# Patient Record
Sex: Female | Born: 1960 | Race: White | Hispanic: No | Marital: Married | Smoking: Current every day smoker
Health system: Southern US, Community
[De-identification: ages and names within clinical notes are randomized; demographics above are authoritative.]

## PROBLEM LIST (undated history)

## (undated) DIAGNOSIS — E28319 Asymptomatic premature menopause: Secondary | ICD-10-CM

## (undated) DIAGNOSIS — F17200 Nicotine dependence, unspecified, uncomplicated: Secondary | ICD-10-CM

## (undated) DIAGNOSIS — C539 Malignant neoplasm of cervix uteri, unspecified: Secondary | ICD-10-CM

## (undated) DIAGNOSIS — R079 Chest pain, unspecified: Secondary | ICD-10-CM

## (undated) DIAGNOSIS — J302 Other seasonal allergic rhinitis: Secondary | ICD-10-CM

## (undated) DIAGNOSIS — I219 Acute myocardial infarction, unspecified: Secondary | ICD-10-CM

## (undated) DIAGNOSIS — R569 Unspecified convulsions: Secondary | ICD-10-CM

## (undated) DIAGNOSIS — M797 Fibromyalgia: Secondary | ICD-10-CM

## (undated) DIAGNOSIS — R87629 Unspecified abnormal cytological findings in specimens from vagina: Secondary | ICD-10-CM

## (undated) DIAGNOSIS — K219 Gastro-esophageal reflux disease without esophagitis: Secondary | ICD-10-CM

## (undated) DIAGNOSIS — M8580 Other specified disorders of bone density and structure, unspecified site: Secondary | ICD-10-CM

## (undated) DIAGNOSIS — E785 Hyperlipidemia, unspecified: Secondary | ICD-10-CM

## (undated) HISTORY — DX: Chest pain, unspecified: R07.9

## (undated) HISTORY — DX: Other seasonal allergic rhinitis: J30.2

## (undated) HISTORY — DX: Gastro-esophageal reflux disease without esophagitis: K21.9

## (undated) HISTORY — DX: Nicotine dependence, unspecified, uncomplicated: F17.200

## (undated) HISTORY — DX: Unspecified abnormal cytological findings in specimens from vagina: R87.629

## (undated) HISTORY — DX: Unspecified convulsions: R56.9

## (undated) HISTORY — PX: VAGINAL HYSTERECTOMY: SUR661

## (undated) HISTORY — DX: Asymptomatic premature menopause: E28.319

## (undated) HISTORY — PX: WRIST SURGERY: SHX841

## (undated) HISTORY — PX: OTHER SURGICAL HISTORY: SHX169

## (undated) HISTORY — DX: Malignant neoplasm of cervix uteri, unspecified: C53.9

---

## 1998-04-09 ENCOUNTER — Other Ambulatory Visit: Admission: RE | Admit: 1998-04-09 | Discharge: 1998-04-09 | Payer: Self-pay | Admitting: Obstetrics & Gynecology

## 2008-09-20 ENCOUNTER — Encounter: Admission: RE | Admit: 2008-09-20 | Discharge: 2008-09-20 | Payer: Self-pay | Admitting: Family Medicine

## 2008-11-02 DIAGNOSIS — R569 Unspecified convulsions: Secondary | ICD-10-CM

## 2008-11-02 HISTORY — DX: Unspecified convulsions: R56.9

## 2009-02-04 ENCOUNTER — Emergency Department (HOSPITAL_COMMUNITY): Admission: EM | Admit: 2009-02-04 | Discharge: 2009-02-04 | Payer: Self-pay | Admitting: Emergency Medicine

## 2009-02-11 ENCOUNTER — Encounter: Admission: RE | Admit: 2009-02-11 | Discharge: 2009-02-11 | Payer: Self-pay | Admitting: Family Medicine

## 2009-05-23 ENCOUNTER — Encounter: Admission: RE | Admit: 2009-05-23 | Discharge: 2009-05-23 | Payer: Self-pay | Admitting: Family Medicine

## 2010-08-05 ENCOUNTER — Ambulatory Visit (HOSPITAL_BASED_OUTPATIENT_CLINIC_OR_DEPARTMENT_OTHER): Admission: RE | Admit: 2010-08-05 | Discharge: 2010-08-05 | Payer: Self-pay | Admitting: Orthopedic Surgery

## 2011-02-11 LAB — COMPREHENSIVE METABOLIC PANEL
AST: 37 U/L (ref 0–37)
Albumin: 4 g/dL (ref 3.5–5.2)
Alkaline Phosphatase: 72 U/L (ref 39–117)
CO2: 27 mEq/L (ref 19–32)
Calcium: 9 mg/dL (ref 8.4–10.5)
GFR calc non Af Amer: >60 mL/min (ref >60)
Sodium: 137 mEq/L (ref 135–145)
Total Bilirubin: 0.6 mg/dL (ref 0.3–1.2)
Total Protein: 7 g/dL (ref 6.0–8.3)

## 2011-02-11 LAB — DIFFERENTIAL
Basophils Absolute: 0 10*3/uL (ref 0.0–0.1)
Basophils Relative: 0 % (ref 0–1)
Eosinophils Absolute: 0.2 10*3/uL (ref 0.0–0.7)
Eosinophils Relative: 2 % (ref 0–5)
Lymphocytes Relative: 16 % (ref 12–46)

## 2011-02-11 LAB — APTT: aPTT: 28 seconds (ref 24–37)

## 2011-02-11 LAB — POCT CARDIAC MARKERS
CKMB, poc: <1 ng/mL — ABNORMAL LOW (ref 1.0–8.0)
Myoglobin, poc: 117 ng/mL (ref 12–200)
Troponin i, poc: <0.05 ng/mL (ref 0.00–0.09)
Troponin i, poc: <0.05 ng/mL (ref 0.00–0.09)

## 2011-02-11 LAB — CBC
Hemoglobin: 12.6 g/dL (ref 12.0–15.0)
MCHC: 35.2 g/dL (ref 30.0–36.0)
RBC: 3.71 MIL/uL — ABNORMAL LOW (ref 3.87–5.11)
WBC: 10.7 10*3/uL — ABNORMAL HIGH (ref 4.0–10.5)

## 2011-02-11 LAB — ETHANOL: Alcohol, Ethyl (B): 5 mg/dL (ref 0–10)

## 2011-10-20 ENCOUNTER — Ambulatory Visit
Admission: RE | Admit: 2011-10-20 | Discharge: 2011-10-20 | Disposition: A | Discharge status: Home / Self Care | Payer: BC Managed Care – PPO | Source: Ambulatory Visit | Attending: Emergency Medicine | Admitting: Emergency Medicine

## 2011-10-20 ENCOUNTER — Other Ambulatory Visit: Payer: Self-pay | Admitting: Emergency Medicine

## 2011-10-20 DIAGNOSIS — R05 Cough: Secondary | ICD-10-CM

## 2012-02-27 ENCOUNTER — Emergency Department (HOSPITAL_COMMUNITY)
Admission: EM | Admit: 2012-02-27 | Discharge: 2012-02-28 | Disposition: A | Discharge status: Home / Self Care | Payer: BC Managed Care – PPO | Attending: Emergency Medicine | Admitting: Emergency Medicine

## 2012-02-27 DIAGNOSIS — IMO0001 Reserved for inherently not codable concepts without codable children: Secondary | ICD-10-CM | POA: Insufficient documentation

## 2012-02-27 DIAGNOSIS — X500XXA Overexertion from strenuous movement or load, initial encounter: Secondary | ICD-10-CM | POA: Insufficient documentation

## 2012-02-27 DIAGNOSIS — F17200 Nicotine dependence, unspecified, uncomplicated: Secondary | ICD-10-CM | POA: Insufficient documentation

## 2012-02-27 DIAGNOSIS — S92309A Fracture of unspecified metatarsal bone(s), unspecified foot, initial encounter for closed fracture: Secondary | ICD-10-CM | POA: Insufficient documentation

## 2012-02-27 DIAGNOSIS — I252 Old myocardial infarction: Secondary | ICD-10-CM | POA: Insufficient documentation

## 2012-02-27 DIAGNOSIS — S92902A Unspecified fracture of left foot, initial encounter for closed fracture: Secondary | ICD-10-CM

## 2012-02-27 HISTORY — DX: Fibromyalgia: M79.7

## 2012-02-27 HISTORY — DX: Acute myocardial infarction, unspecified: I21.9

## 2012-02-27 HISTORY — DX: Other specified disorders of bone density and structure, unspecified site: M85.80

## 2012-02-27 HISTORY — DX: Hyperlipidemia, unspecified: E78.5

## 2012-02-28 ENCOUNTER — Encounter (HOSPITAL_COMMUNITY): Payer: Self-pay | Admitting: General Practice

## 2012-02-28 ENCOUNTER — Emergency Department (HOSPITAL_COMMUNITY): Payer: BC Managed Care – PPO

## 2012-02-28 MED ORDER — HYDROCODONE-ACETAMINOPHEN 5-500 MG PO TABS: 1.0000 | ORAL_TABLET | Freq: Four times a day (QID) | ORAL | Status: AC | PRN

## 2012-02-28 MED ORDER — IBUPROFEN 800 MG PO TABS: 800.0000 mg | ORAL_TABLET | Freq: Three times a day (TID) | ORAL | Status: AC

## 2012-02-28 MED ORDER — IBUPROFEN 800 MG PO TABS
800.0000 mg | ORAL_TABLET | Freq: Once | ORAL | Status: AC
  Administered 2012-02-28: 800 mg via ORAL
  Filled 2012-02-28: qty 1

## 2012-02-28 MED ORDER — HYDROCODONE-ACETAMINOPHEN 5-325 MG PO TABS
2.0000 | ORAL_TABLET | Freq: Once | ORAL | Status: AC
  Administered 2012-02-28: 2 via ORAL
  Filled 2012-02-28: qty 2

## 2012-02-28 NOTE — ED Notes (Signed)
Pt states that she was walking and stepped onto uneven concrete and rolled her foot. She states that it has increasingly swelled since the event. Pt is in no acute distress and denies pain anywhere else.

## 2012-02-28 NOTE — ED Notes (Signed)
Patient given discharge instructions, information, prescriptions, and diet order. Patient states that they adequately understand discharge information given and to return to ED if symptoms return or worsen.     

## 2012-02-28 NOTE — ED Provider Notes (Signed)
History     CSN: [id number]  Arrival date & time 02/27/12  2346   First MD Initiated Contact with Patient 02/28/12 0035      Chief Complaint  Patient presents with  . Foot Pain    (Consider location/radiation/quality/duration/timing/severity/associated sxs/prior treatment) HPI History provided by the patient. Walking in clogs tonight and rolled her left foot, sustaining injury. No fall or injury otherwise. Complaints of pain across the top of her foot. Sharp in quality and not radiating. No ankle pain. No knee pain. No hip pain. No associated weakness or numbness. Mild swelling. No abrasion or open skin wound. No history of same. Unable to bear weight. Hurts to try to walk, otherwise no known alleviating factors. Past Medical History  Diagnosis Date  . MI (myocardial infarction)   . Hyperlipidemia   . Fibromyalgia   . Osteopenia     Past Surgical History  Procedure Date  . Vaginal hysterectomy   . Wrist surgery     No family history on file.  History  Substance Use Topics  . Smoking status: Current Everyday Smoker -- 1.0 packs/day for 10 years    Types: Cigarettes  . Smokeless tobacco: Never Used  . Alcohol Use: 8.4 oz/week    14 Cans of beer per week    OB History    Grav Para Term Preterm Abortions TAB SAB Ect Mult Living                  Review of Systems  Constitutional: Negative for fever and chills.  HENT: Negative for neck pain and neck stiffness.   Eyes: Negative for pain.  Respiratory: Negative for shortness of breath.   Cardiovascular: Negative for chest pain.  Gastrointestinal: Negative for abdominal pain.  Genitourinary: Negative for dysuria.  Musculoskeletal: Negative for back pain.  Skin: Negative for rash.  Neurological: Negative for headaches.  All other systems reviewed and are negative.    Allergies  Darvocet  Home Medications   Current Outpatient Rx  Name Route Sig Dispense Refill  . ATORVASTATIN CALCIUM 20 MG PO TABS Oral Take  20 mg by mouth daily.    . TRAMADOL HCL 50 MG PO TABS Oral Take 50 mg by mouth every 8 (eight) hours as needed.      BP 143/79  Pulse 94  Temp(Src) 98.3 F (36.8 C) (Oral)  Resp 18  Ht 5\' 6"  (1.676 m)  Wt 115 lb (52.164 kg)  BMI 18.56 kg/m2  SpO2 98%  Physical Exam  Constitutional: She is oriented to person, place, and time. She appears well-developed and well-nourished.  HENT:  Head: Normocephalic and atraumatic.  Eyes: Conjunctivae and EOM are normal. Pupils are equal, round, and reactive to light.  Neck: Trachea normal. Neck supple. No thyromegaly present.  Cardiovascular: Normal rate, regular rhythm, S1 normal, S2 normal and normal pulses.     No systolic murmur is present   No diastolic murmur is present  Pulses:      Radial pulses are 2+ on the right side, and 2+ on the left side.  Pulmonary/Chest: Effort normal and breath sounds normal. She has no wheezes. She has no rhonchi. She has no rales. She exhibits no tenderness.  Abdominal: Normal appearance. There is no CVA tenderness and negative Murphy's sign.  Musculoskeletal:       Left lower extremity: Tenderness over the dorsum of the foot with minimal swelling. Distal neurovascular intact with good cap refill. No tenderness over the ankle or proximal fibula. Skin intact  throughout without abrasion or laceration  Neurological: She is alert and oriented to person, place, and time. She has normal strength. No cranial nerve deficit or sensory deficit. GCS eye subscore is 4. GCS verbal subscore is 5. GCS motor subscore is 6.  Skin: Skin is warm and dry. No rash noted. She is not diaphoretic.  Psychiatric: Her speech is normal.       Cooperative and appropriate    ED Course  Procedures (including critical care time)  Labs Reviewed - No data to display Dg Foot Complete Left  02/28/2012  *RADIOLOGY REPORT*  Clinical Data: Left foot pain status post fall  LEFT FOOT - COMPLETE 3+ VIEW  Comparison: The  Findings: Oblique mildly  comminuted fractures of the mid distal shafts of the second and third metatarsals.  In addition, there is a oblique fracture through the distal shaft of the fourth metatarsal.  No intra-articular extension. The medial sesamoid is favored to be bipartite however correlate with point tenderness.  IMPRESSION: Fractures of the second through fourth metatarsals.  Original Report Authenticated By: Waneta Martins, M.D.    Pain medications provided. Left lower extremity iced and elevated.  X-rays obtained and reviewed as above. Patient declines ankle films.  Splint applied by [employer] and on recheck has appropriate capillary refill and distal neurovascular intact. Crutches provided. MDM   Closed left foot fractures as above. Splint. Crutches. Pain medications provided. Orthopedic referral and plan outpatient followup. Patient understands no weightbearing and need for close followup.        Sunnie Nielsen, MD 02/28/12 [phone]

## 2012-02-28 NOTE — Discharge Instructions (Signed)
Foot Fracture Your caregiver has diagnosed you as having multiple foot fractures (broken bone) involving the left second third and fourth metatarsals.. Your foot has many bones. You have a fracture, or break, in one of these bones. In some cases, your doctor may put on a splint or removable fracture boot until the swelling in your foot has lessened. A cast may or may not be required. It is very important to use crutches and do not put any weight on the left foot until evaluated by an orthopedic surgeon. Call first thing in the morning for close followup in the clinic. Take medications as prescribed. No driving or medicated with hydrocodone. HOME CARE INSTRUCTIONS  If you do not have a cast or splint:  You may bear weight on your injured foot as tolerated or advised.   Do not put any weight on your injured foot for as long as directed by your caregiver. Slowly increase the amount of time you walk on the foot as the pain and swelling allows or as advised.   Use crutches until you can bear weight without pain. A gradual increase in weight bearing may help.   Apply ice to the injury for 15 to 20 minutes each hour while awake for the first 2 days. Put the ice in a plastic bag and place a towel between the bag of ice and your skin.   If an ace bandage (stretchy, elastic wrapping bandage) was applied, you may re-wrap it if ankle is more painful or your toes become cold and swollen.  If you have a cast or splint:  Use your crutches for as long as directed by your caregiver.   To lessen the swelling, keep the injured foot elevated on pillows while lying down or sitting. Elevate your foot above your heart.   Apply ice to the injury for 15 to 20 minutes each hour while awake for the first 2 days. Put the ice in a plastic bag and place a thin towel between the bag of ice and your cast.   Plaster or fiberglass cast:   Do not try to scratch the skin under the cast using a sharp or pointed object down the  cast.   Check the skin around the cast every day. You may put lotion on any red or sore areas.   Keep your cast clean and dry.   Plaster splint:   Wear the splint until you are seen for a follow-up examination.   You may loosen the elastic around the splint if your toes become numb, tingle, or turn blue or cold. Do not rest it on anything harder than a pillow in the first 24 hours.   Do not put pressure on any part of your splint. Use your crutches as directed.   Keep your splint dry. It can be protected during bathing with a plastic bag. Do not lower the splint into water.   If you have a fracture boot you may remove it to shower. Bear weight only as instructed by your caregiver.   Only take over-the-counter or prescription medicines for pain, discomfort, or fever as directed by your caregiver.  SEEK IMMEDIATE MEDICAL CARE IF:   Your cast gets damaged or breaks.   You have continued severe pain or more swelling than you did before the cast was put on.   Your skin or nails of your casted foot turn blue, gray, feel cold or numb.   There is a bad smell from your cast.  There is severe pain with movement of your toes.   There are new stains and/or drainage coming from under the cast.  MAKE SURE YOU:   Understand these instructions.   Will watch your condition.   Will get help right away if you are not doing well or get worse.

## 2012-09-05 ENCOUNTER — Other Ambulatory Visit: Payer: Self-pay | Admitting: Family Medicine

## 2012-09-05 ENCOUNTER — Ambulatory Visit
Admission: RE | Admit: 2012-09-05 | Discharge: 2012-09-05 | Disposition: A | Discharge status: Home / Self Care | Payer: BC Managed Care – PPO | Source: Ambulatory Visit | Attending: Family Medicine | Admitting: Family Medicine

## 2012-09-05 DIAGNOSIS — R52 Pain, unspecified: Secondary | ICD-10-CM

## 2012-09-12 ENCOUNTER — Encounter: Payer: Self-pay | Admitting: Family Medicine

## 2012-09-12 ENCOUNTER — Ambulatory Visit (INDEPENDENT_AMBULATORY_CARE_PROVIDER_SITE_OTHER): Payer: BC Managed Care – PPO | Admitting: Family Medicine

## 2012-09-12 VITALS — BP 112/78 | HR 72 | Ht 65.25 in | Wt 120.0 lb

## 2012-09-12 DIAGNOSIS — M8580 Other specified disorders of bone density and structure, unspecified site: Secondary | ICD-10-CM

## 2012-09-12 DIAGNOSIS — S92309A Fracture of unspecified metatarsal bone(s), unspecified foot, initial encounter for closed fracture: Secondary | ICD-10-CM

## 2012-09-12 DIAGNOSIS — K219 Gastro-esophageal reflux disease without esophagitis: Secondary | ICD-10-CM

## 2012-09-12 DIAGNOSIS — M899 Disorder of bone, unspecified: Secondary | ICD-10-CM

## 2012-09-12 DIAGNOSIS — E7800 Pure hypercholesterolemia, unspecified: Secondary | ICD-10-CM

## 2012-09-12 DIAGNOSIS — F17200 Nicotine dependence, unspecified, uncomplicated: Secondary | ICD-10-CM

## 2012-09-12 DIAGNOSIS — M949 Disorder of cartilage, unspecified: Secondary | ICD-10-CM

## 2012-09-12 DIAGNOSIS — I2510 Atherosclerotic heart disease of native coronary artery without angina pectoris: Secondary | ICD-10-CM

## 2012-09-12 NOTE — Patient Instructions (Signed)
Please send records (labs, office visits, DEXA, immunizations, etc)  Please try and quit smoking. I recommend cutting back on alcohol to just 1 drink/day (if that)  I'd like for you to consider alternatives for treatment of fibromyalgia pain (ie Savella, Cymbalta, Lyrica), and to taper down off Tramadol.

## 2012-09-12 NOTE — Progress Notes (Signed)
Chief Complaint  Patient presents with  . Establish Care    new patient to establish care. Would like to have DEXA updated and discuss some osteoporosis treatments.   Patient presents to establish care.  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 She is requesting DEXA scan.  Last DEXA was 05/2010 at [facility] showing osteopenia.  She used to work there at the time, and prefers to have it done somewhere closer. Vitamin D level 2 years ago was reportedly normal.  She fractured 2nd, 3rd and 4th metatarsals in her L foot (foot inverted walking across a pothole in clogs)  02/2012.  Treated by Dr. Victorino Dike.  In hard cast for 2.5 months, in CAM walker.  Got out of CAM walker in September.  Last week she accidentally kicked the wall.  Fractured 1st metatarsal.  Saw Dr. Victorino Dike and was put back in boot.  ?avulsion fracture on x-ray.    Her past medical history is also significant for reflux, tobacco abuse, fibromyalgia, hyperlipidemia and CAD. She states she hasn't seen Dr. Patty Sermons for at least 2 year. She later thought it may have been 5 years ago.  Last stress test was okay. Hyperlipidemia--last labs were 06/2011.  Had been getting treated by Dr. Lorenz Coaster, who left the office.  Fibromyalgia was diagnosed many years ago.  Hurts in all of her joints, shoulders, hamstrings, biceps.  Symptoms began in 1994.  Changed from narcotics to ultram many years ago.  Tried changing to celebrex for a year, and it didn't work as well.  Pain is mostly in her neck now.  With regard to her Ultram use "I've been on it forever,  It works".  She has never been on lyrica, neurontin, savella or cymbalta.  Was put on imipramine for trouble staying asleep, back in the 80's.  (also took desipramine at one point).  2 years ago she had recurrent trouble sleeping, so she restarted imipramine.  After she was back on it x 3 weeks (titrating up dose) she had grandmal seizure--felt to be  related to taking both imipramine and tramadol together, lowering the seizure threshold.  She had followed with neuro at the time.  Denies any recent seizure activity.  Past Medical History  Diagnosis Date  . MI (myocardial infarction) age 51    in [place].  Dr. Patty Sermons  . Hyperlipidemia   . Fibromyalgia   . Osteopenia   . Premature menopause [age]  . Cervical cancer early 20's  . Smoker   . Abnormal Pap smear of vagina     abnl paps of vaginal cuff; last pap 11/2003  . GERD (gastroesophageal reflux disease)   . Hyperlipidemia   . Seizure 2010    per pt, related to lowered seizure threshold from imipramine and tramadol)--saw Dr. Anne Hahn  . Seasonal allergies     now mostly yearlong   Past Surgical History  Procedure Date  . Vaginal hysterectomy     fibroids  . Wrist surgery     deQuervains  . Surgery for cervical cancer    History   Social History  . Marital Status: Single    Spouse Name: N/A    Number of Children: 1  . Years of Education: N/A   Occupational History  . 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 Smoking status: Current Every Day Smoker -- 1.0 packs/day for 10 years    Types: Cigarettes  . Smokeless  tobacco: Never Used  . Alcohol Use: 8.4 oz/week    14 Cans of beer per week     Comment: 2-3 beers per day.  . Drug Use: No  . Sexually Active: Not Currently   Other Topics Concern  . Not on file   Social History Narrative   Lives at home with 69 year old autistic son.  5 cats, 1 dog.     Family History  Problem Relation Age of Onset  . Scoliosis Mother   . Cancer Father 30    testicular cancer  . Autism Son   . Cancer Maternal Uncle     esophageal (smoker)  . Macular degeneration Maternal Grandmother     wet  . Hypertension Maternal Grandmother   . Hyperlipidemia Maternal Grandmother   . Depression Maternal Grandmother     related to age, loss of vision  . Colitis Maternal Grandmother   . Diabetes Neg Hx    Current outpatient  prescriptions:atorvastatin (LIPITOR) 10 MG tablet, Take 10 mg by mouth daily., Disp: , Rfl: ;  calcium carbonate (OS-CAL) 600 MG TABS, Take 1,200 mg by mouth daily., Disp: , Rfl: ;  Cholecalciferol (VITAMIN D) 1000 UNITS capsule, Take 1,000 Units by mouth daily., Disp: , Rfl: ;  lansoprazole (PREVACID) 30 MG capsule, Take 30 mg by mouth daily., Disp: , Rfl:  traMADol (ULTRAM) 50 MG tablet, Take 100 mg by mouth every 8 (eight) hours as needed. , Disp: , Rfl:   Allergies  Allergen Reactions  . Darvon (Propoxyphene Hcl) Nausea And Vomiting   ROS: denies fevers, weight changes, URI symptoms, cough, shortness of breath, chest pain, edema, skin lesions.  Reflux is controlled.  Denies dysphagia, bowel changes.  +diffuse joint/muscle pains.  denies bleeding/bruising, skin rashes/lesions.  Denies depression. Denies urinary complaints.  PHYSICAL EXAM: BP 112/78  Pulse 72  Ht 5' 5.25" (1.657 m)  Wt 120 lb (54.432 kg)  BMI 19.82 kg/m2 Thing, pleasant female, in a CAM walker, in no distress HEENT:  PERRL, EOMI, conjunctiva clear.  OP clear Neck: no lymphadenopathy, thyromegaly or mass Heart: regular rate and rhythm without murmur Lungs: clear bilaterally Abdomen: soft, nontender, no organomegaly or mass.  Normal bowel sounds Extremities: no edema.  Skin: no rash Psych:  Somewhat flat affect.  Nurse reported ?alcohol on breath (I couldn't detect).  Normal hygiene and grooming.  Normal gait. Neuro: alert and oriented.  Normal gait (in boot).  ASSESSMENT/PLAN:  1. Osteopenia  DG Bone Density  2. Metatarsal fracture  DG Bone Density  3. Tobacco use disorder    4. GERD (gastroesophageal reflux disease)    5. Pure hypercholesterolemia    6. CAD (coronary artery disease)     Smoking--counseled extensively regarding risks/consequences, especially with her recently fractured bones/osteopenia and h/o CAD. Excess alcohol intake discussed in detail, and strongly encouraged that she cut back to just 1/day  at most. GERD--controlled CAD--asymptomatic.  Recommend routine f/u with cardiology Hyperlipidemia--past due for labs.  Wants to go to [facility], opens at 7:30 c-met, lipids, TSH written on rx and given to pt for her to get labs through [employer] discussed at length my low comfort level with her current regimen--high doses of tramadol used chronically.  I do not feel comfortable maintaining her on this regimen.  We briefly discussed other medications used for treatment of fibromyalgia pain, and that she should return to discuss these in more detail (she can check into her insurance coverage for meds in the meantime, and look into  side effects).  She will need to taper down off ultram.  Will get flu shot at work before Thanksgiving.  Declined Flu shot at office today.  F/u 30 minute visit after DEXA results, after records reviewed. Patient to send records here--discussed which records needed.  Will discuss changing meds for fibromyalgia at her next visit.  45 minute visit, more than 1/2 spent counseling (alcohol, meds, smoking, etc.)

## 2012-09-14 DIAGNOSIS — M810 Age-related osteoporosis without current pathological fracture: Secondary | ICD-10-CM | POA: Insufficient documentation

## 2012-09-14 DIAGNOSIS — K219 Gastro-esophageal reflux disease without esophagitis: Secondary | ICD-10-CM | POA: Insufficient documentation

## 2012-09-14 DIAGNOSIS — M8580 Other specified disorders of bone density and structure, unspecified site: Secondary | ICD-10-CM | POA: Insufficient documentation

## 2012-09-14 DIAGNOSIS — I2510 Atherosclerotic heart disease of native coronary artery without angina pectoris: Secondary | ICD-10-CM | POA: Insufficient documentation

## 2012-09-14 DIAGNOSIS — E7800 Pure hypercholesterolemia, unspecified: Secondary | ICD-10-CM | POA: Insufficient documentation

## 2012-09-14 DIAGNOSIS — S92309A Fracture of unspecified metatarsal bone(s), unspecified foot, initial encounter for closed fracture: Secondary | ICD-10-CM | POA: Insufficient documentation

## 2012-09-14 DIAGNOSIS — F17200 Nicotine dependence, unspecified, uncomplicated: Secondary | ICD-10-CM | POA: Insufficient documentation

## 2012-10-19 ENCOUNTER — Ambulatory Visit: Payer: BC Managed Care – PPO | Admitting: Family Medicine

## 2015-10-31 ENCOUNTER — Emergency Department (HOSPITAL_COMMUNITY)
Admission: EM | Admit: 2015-10-31 | Discharge: 2015-10-31 | Disposition: A | Discharge status: Home / Self Care | Payer: Self-pay | Attending: Emergency Medicine | Admitting: Emergency Medicine

## 2015-10-31 ENCOUNTER — Encounter (HOSPITAL_COMMUNITY): Payer: Self-pay | Admitting: Emergency Medicine

## 2015-10-31 DIAGNOSIS — M797 Fibromyalgia: Secondary | ICD-10-CM | POA: Insufficient documentation

## 2015-10-31 DIAGNOSIS — M8580 Other specified disorders of bone density and structure, unspecified site: Secondary | ICD-10-CM | POA: Insufficient documentation

## 2015-10-31 DIAGNOSIS — I252 Old myocardial infarction: Secondary | ICD-10-CM | POA: Insufficient documentation

## 2015-10-31 DIAGNOSIS — M542 Cervicalgia: Secondary | ICD-10-CM | POA: Insufficient documentation

## 2015-10-31 DIAGNOSIS — E785 Hyperlipidemia, unspecified: Secondary | ICD-10-CM | POA: Insufficient documentation

## 2015-10-31 DIAGNOSIS — K219 Gastro-esophageal reflux disease without esophagitis: Secondary | ICD-10-CM | POA: Insufficient documentation

## 2015-10-31 DIAGNOSIS — G8929 Other chronic pain: Secondary | ICD-10-CM | POA: Insufficient documentation

## 2015-10-31 DIAGNOSIS — F17210 Nicotine dependence, cigarettes, uncomplicated: Secondary | ICD-10-CM | POA: Insufficient documentation

## 2015-10-31 DIAGNOSIS — Z8541 Personal history of malignant neoplasm of cervix uteri: Secondary | ICD-10-CM | POA: Insufficient documentation

## 2015-10-31 DIAGNOSIS — H9221 Otorrhagia, right ear: Secondary | ICD-10-CM | POA: Insufficient documentation

## 2015-10-31 DIAGNOSIS — R51 Headache: Secondary | ICD-10-CM | POA: Insufficient documentation

## 2015-10-31 DIAGNOSIS — Z79899 Other long term (current) drug therapy: Secondary | ICD-10-CM | POA: Insufficient documentation

## 2015-10-31 MED ORDER — METHOCARBAMOL 500 MG PO TABS: 1000.0000 mg | ORAL_TABLET | Freq: Every evening | ORAL | Status: DC | PRN

## 2015-10-31 NOTE — ED Provider Notes (Signed)
CSN: [id number]     Arrival date & time 10/31/15  1031 History  By signing my name below, I, Amber Roberts, attest that this documentation has been prepared under the direction and in the presence of [name], PA-C.  Electronically Signed: [name], ED Scribe. 10/31/2015. 11:55 AM.     Chief Complaint  Patient presents with  . Neck Pain  . Ear Drainage   Patient is a 54 y.o. female presenting with neck pain and ear drainage. The history is provided by the patient. No language interpreter was used.  Neck Pain Associated symptoms: headaches   Associated symptoms: no fever   Ear Drainage Associated symptoms include headaches.   HPI Comments: Amber Roberts is a 54 y.o. female who presents to the Emergency Department complaining of sudden onset of bloody right ear discharge onset today at 3 AM. Pt states that she woke up in the middle of the and noticed blood coming from her right ear. Pt does report some muffled hearing out of the right ear after irrigation today. She denies recent injury or trauma to the ear. Pt does report Hx of chronic neck pain. She state that its normal for the neck pain to radiates up causing her to have a posterior HA and left ear pain. She states that the pain is worse with movement. She states that she normally gets relief with ibuprofen. She denies hearing loss or vision changes. Pt does report hx of Bone spurs.    Past Medical History  Diagnosis Date  . MI (myocardial infarction) ([place]) age 47    in [place].  Dr. Mare Ferrari  . Hyperlipidemia   . Fibromyalgia   . Osteopenia   . Premature menopause [age]  . Cervical cancer ([place]) early 20's  . Smoker   . Abnormal Pap smear of vagina     abnl paps of vaginal cuff; last pap 11/2003  . GERD (gastroesophageal reflux disease)   . Hyperlipidemia   . Seizure ([place]) 2010    per pt, related to lowered seizure threshold from imipramine and tramadol)--saw Dr. Jannifer Franklin  . Seasonal allergies     now mostly  yearlong   Past Surgical History  Procedure Laterality Date  . Vaginal hysterectomy      fibroids  . Wrist surgery      deQuervains  . Surgery for cervical cancer     Family History  Problem Relation Age of Onset  . Scoliosis Mother   . Cancer Father 33    testicular cancer  . Autism Son   . Cancer Maternal Uncle     esophageal (smoker)  . Macular degeneration Maternal Grandmother     wet  . Hypertension Maternal Grandmother   . Hyperlipidemia Maternal Grandmother   . Depression Maternal Grandmother     related to age, loss of vision  . Colitis Maternal Grandmother   . Diabetes Neg Hx    Social History  Substance Use Topics  . Smoking status: Current Every Day Smoker -- 1.00 packs/day for 10 years    Types: Cigarettes  . Smokeless tobacco: Never Used  . Alcohol Use: 8.4 oz/week    14 Cans of beer per week     Comment: 2-3 beers per day.   OB History    No data available      Review of Systems  Constitutional: Negative for fever.  HENT: Positive for ear discharge and ear pain. Negative for hearing loss.   Eyes: Negative for visual disturbance.  Musculoskeletal: Positive for  neck pain.  Neurological: Positive for headaches.     Allergies  Darvon  Home Medications   Prior to Admission medications   Medication Sig Start Date End Date Taking? Authorizing Provider  atorvastatin (LIPITOR) 10 MG tablet Take 10 mg by mouth daily.    Historical Provider, MD  calcium carbonate (OS-CAL) 600 MG TABS Take 1,200 mg by mouth daily.    Historical Provider, MD  Cholecalciferol (VITAMIN D) 1000 UNITS capsule Take 1,000 Units by mouth daily.    Historical Provider, MD  lansoprazole (PREVACID) 30 MG capsule Take 30 mg by mouth daily.    Historical Provider, MD  traMADol (ULTRAM) 50 MG tablet Take 100 mg by mouth every 8 (eight) hours as needed.     Historical Provider, MD   BP 131/81 mmHg  Pulse 79  Temp(Src) 98.2 F (36.8 C) (Oral)  Resp 14  SpO2 100%   Physical Exam   Constitutional: She appears well-developed and well-nourished. No distress.  HENT:  Head: Normocephalic and atraumatic.  Right Ear: Tympanic membrane and external ear normal. Tympanic membrane is not injected, not scarred, not perforated, not erythematous, not retracted and not bulging.  Left Ear: Tympanic membrane, external ear and ear canal normal.  Nose: Nose normal. No mucosal edema or rhinorrhea.  Mouth/Throat: Uvula is midline, oropharynx is clear and moist and mucous membranes are normal. Mucous membranes are not dry. No oral lesions. No trismus in the jaw. No uvula swelling. No oropharyngeal exudate, posterior oropharyngeal edema, posterior oropharyngeal erythema or tonsillar abscesses.  Right TM is normal. There are several patches of dried blood noted in the ear canal itself. There is a small fissure-like area along the bottom of the canal with dried blood which appears to be the source.  Eyes: Conjunctivae and EOM are normal. Right eye exhibits no discharge. Left eye exhibits no discharge.  Neck: Normal range of motion. Neck supple. No tracheal deviation present.  Cardiovascular: Normal rate, regular rhythm and normal heart sounds.   Pulmonary/Chest: Effort normal and breath sounds normal. No respiratory distress. She has no wheezes. She has no rales.  Abdominal: Soft. There is no tenderness.  Musculoskeletal: Normal range of motion.       Cervical back: She exhibits tenderness. She exhibits normal range of motion and no bony tenderness.  Lymphadenopathy:    She has no cervical adenopathy.  Neurological: She is alert.  Skin: Skin is warm and dry.  Psychiatric: She has a normal mood and affect. Her behavior is normal.  Nursing note and vitals reviewed.   ED Course  Procedures (including critical care time) DIAGNOSTIC STUDIES: Oxygen Saturation is 100% on RA, normal by my interpretation.    COORDINATION OF CARE: 12:04 PM-Discussed treatment plan with pt at bedside and pt  agreed to plan.     Labs Review Labs Reviewed - No data to display  Imaging Review No results found.    EKG Interpretation None       Vital signs reviewed and are as follows: Filed Vitals:   10/31/15 1107  BP: 131/81  Pulse: 79  Temp: 98.2 F (36.8 C)  Resp: 14   Patient counseled to keep ear out of the canal for the next several days. Return with worsening or changing symptoms.  Regarding chronic neck pain, muscle relaxer given for bedtime. Patient encouraged to follow-up with PCP for continued evaluation and management.  MDM   Final diagnoses:  Bleeding from ear, right  Chronic neck pain   Bleeding from ear: Appears  to be from disruption of canal itself. TM appears normal. No signs of infection.  Chronic neck pain: Likely musculoskeletal in nature, however cannot rule out radicular pain. This has been ongoing for 6 months and is unchanged recently. Muscle relaxer given for bedtime. Patient encouraged to follow-up with PCP. No red flag signs and symptoms of neck or back pain.   I personally performed the services described in this documentation, which was scribed in my presence. The recorded information has been reviewed and is accurate.      [name], PA-C 10/31/15 [place], MD 11/02/15 [phone]

## 2015-10-31 NOTE — Discharge Instructions (Signed)
Please read and follow all provided instructions.  Your diagnoses today include:  1. Bleeding from ear, right   2. Chronic neck pain    Tests performed today include:  Vital signs - see below for your results today  Medications prescribed:   Robaxin (methocarbamol) - muscle relaxer medication  DO NOT drive or perform any activities that require you to be awake and alert because this medicine can make you drowsy.   Take any prescribed medications only as directed.  Home care instructions:   Follow any educational materials contained in this packet  Please rest, use ice or heat on your back for the next several days  Do not lift, push, pull anything more than 10 pounds for the next week  Follow-up instructions: Please follow-up with your primary care provider in the next 1 week for further evaluation of your symptoms.   Return instructions:  SEEK IMMEDIATE MEDICAL ATTENTION IF YOU HAVE:  New numbness, tingling, weakness, or problem with the use of your arms or legs  Severe back pain not relieved with medications  Loss control of your bowels or bladder  Increasing pain in any areas of the body (such as chest or abdominal pain)  Shortness of breath, dizziness, or fainting.   Worsening nausea (feeling sick to your stomach), vomiting, fever, or sweats  Any other emergent concerns regarding your health   Additional Information:  Your vital signs today were: BP 131/81 mmHg   Pulse 79   Temp(Src) 98.2 F (36.8 C) (Oral)   Resp 14   SpO2 100% If your blood pressure (BP) was elevated above 135/85 this visit, please have this repeated by your doctor within one month. --------------

## 2015-10-31 NOTE — ED Notes (Signed)
Pt c/o neck and head pain x several months, and painless ear bright red bleeding without trauma onset last night after she woke up with a feeling of fullness in her ear and noticed there was blood. Hx of cervical cancer, no chemo or radiation. No anticoagulants. Tympanic membrane intact, bones of inner ear visible, cone of light in appropriate position, pearly gray with bright red spots. Dried blood in ear canal.

## 2017-07-09 ENCOUNTER — Ambulatory Visit (INDEPENDENT_AMBULATORY_CARE_PROVIDER_SITE_OTHER): Payer: 59 | Admitting: Primary Care

## 2017-07-09 ENCOUNTER — Encounter: Payer: Self-pay | Admitting: Primary Care

## 2017-07-09 VITALS — BP 122/74 | HR 80 | Temp 98.2°F | Ht 65.25 in | Wt 111.4 lb

## 2017-07-09 DIAGNOSIS — R002 Palpitations: Secondary | ICD-10-CM

## 2017-07-09 DIAGNOSIS — I2510 Atherosclerotic heart disease of native coronary artery without angina pectoris: Secondary | ICD-10-CM

## 2017-07-09 DIAGNOSIS — Z1231 Encounter for screening mammogram for malignant neoplasm of breast: Secondary | ICD-10-CM | POA: Diagnosis not present

## 2017-07-09 DIAGNOSIS — K219 Gastro-esophageal reflux disease without esophagitis: Secondary | ICD-10-CM

## 2017-07-09 DIAGNOSIS — E7800 Pure hypercholesterolemia, unspecified: Secondary | ICD-10-CM

## 2017-07-09 DIAGNOSIS — M8580 Other specified disorders of bone density and structure, unspecified site: Secondary | ICD-10-CM | POA: Diagnosis not present

## 2017-07-09 DIAGNOSIS — R000 Tachycardia, unspecified: Secondary | ICD-10-CM

## 2017-07-09 DIAGNOSIS — Z1239 Encounter for other screening for malignant neoplasm of breast: Secondary | ICD-10-CM

## 2017-07-09 DIAGNOSIS — E785 Hyperlipidemia, unspecified: Secondary | ICD-10-CM

## 2017-07-09 DIAGNOSIS — F17200 Nicotine dependence, unspecified, uncomplicated: Secondary | ICD-10-CM | POA: Diagnosis not present

## 2017-07-09 LAB — COMPREHENSIVE METABOLIC PANEL
ALBUMIN: 4.5 g/dL (ref 3.5–5.2)
ALK PHOS: 73 U/L (ref 39–117)
ALT: 26 U/L (ref 0–35)
AST: 39 U/L — AB (ref 0–37)
BILIRUBIN TOTAL: 0.6 mg/dL (ref 0.2–1.2)
BUN: 6 mg/dL (ref 6–23)
CALCIUM: 9.8 mg/dL (ref 8.4–10.5)
CO2: 28 mEq/L (ref 19–32)
CREATININE: 0.78 mg/dL (ref 0.40–1.20)
Chloride: 100 mEq/L (ref 96–112)
GFR: 81.63 mL/min (ref >60.00)
Glucose, Bld: 97 mg/dL (ref 70–99)
Potassium: 3.8 mEq/L (ref 3.5–5.1)
SODIUM: 138 meq/L (ref 135–145)
TOTAL PROTEIN: 7.5 g/dL (ref 6.0–8.3)

## 2017-07-09 LAB — CBC
HCT: 43.6 % (ref 36.0–46.0)
Hemoglobin: 14.6 g/dL (ref 12.0–15.0)
MCHC: 33.4 g/dL (ref 30.0–36.0)
MCV: 103.3 fl — AB (ref 78.0–100.0)
Platelets: 182 10*3/uL (ref 150.0–400.0)
RBC: 4.23 Mil/uL (ref 3.87–5.11)
RDW: 14.9 % (ref 11.5–15.5)
WBC: 7.3 10*3/uL (ref 4.0–10.5)

## 2017-07-09 LAB — LDL CHOLESTEROL, DIRECT: LDL DIRECT: 108 mg/dL

## 2017-07-09 LAB — LIPID PANEL
CHOLESTEROL: 232 mg/dL — AB (ref 0–200)
HDL: 66.9 mg/dL (ref >39.00)
NonHDL: 164.82
Total CHOL/HDL Ratio: 3
Triglycerides: 302 mg/dL — ABNORMAL HIGH (ref 0.0–149.0)
VLDL: 60.4 mg/dL — ABNORMAL HIGH (ref 0.0–40.0)

## 2017-07-09 LAB — TSH: TSH: 0.78 u[IU]/mL (ref 0.35–4.50)

## 2017-07-09 LAB — HEMOGLOBIN A1C: HEMOGLOBIN A1C: 5.3 % (ref 4.6–6.5)

## 2017-07-09 NOTE — Patient Instructions (Signed)
Complete lab work prior to leaving today.   Please call me if you develop the fast heart rate and palpitations again.  Call the [facility] to schedule your mammogram and bone density test.  I will be in touch early next week with your lab results and our next step.  It was a pleasure to meet you today! Please don't hesitate to call me with any questions. Welcome to [facility]!

## 2017-07-09 NOTE — Assessment & Plan Note (Addendum)
Check lipids today.  ECG today: NSR with rate of 71. T-wave inversion to V1 and V2. No acute ST changes. No PAC/PVC. Consider cardiology eval once labs return.

## 2017-07-09 NOTE — Assessment & Plan Note (Signed)
Current smoker, discussed importance of tobacco abuse with co-morbidities. She plans on quitting.

## 2017-07-09 NOTE — Assessment & Plan Note (Signed)
Overall improved over the years, continue Zantac PRN. Discouraged use of PPI's given history of osteopenia. Also discussed importance of tobacco cessation.

## 2017-07-09 NOTE — Assessment & Plan Note (Signed)
Order for Dexa scan placed. Discouraged smoking. Discussed to start vitamin D with calcium.

## 2017-07-09 NOTE — Assessment & Plan Note (Signed)
No recent lipid panel on file.  Lipids pending today. Was able to tolerate low doses of atorvastatin in the past, consider Crestor if needed given history of myalgias.

## 2017-07-09 NOTE — Progress Notes (Signed)
Subjective:    Patient ID: Amber Roberts, female    DOB: 1961-07-09, 56 y.o.   MRN: [id number]  HPI  Amber Roberts is a 56 year old female who presents today to establish care and discuss the problems mentioned below. Will obtain old records.  1) CAD/Hyperlipdiemia/Tachycardia: Diagnosed several years ago. History of myocardial infarction at age 83, but she doesn't think she actually had a heart attack. She never underwent cardiac catheterization. Her last stress test was unremarkable in 2008, previously getting stress tests annually that were unremarkable. No cardiology follow up in years.  She denies chest pain, but does experience palpitations. Sunday morning she woke up with a pulse rate of 132 and a BP of 150/94. She felt weak, fatigued for several days later. Her pulse rate would remain high for 4-5 hours, then reduce, then increase. Her tachycardia continued until Tuesday this week (three days ago). She denies anxiety, new medications, supplements, unexplained weight loss.   Previously managed on atorvastatin 10 mg for hyperlipidemia. She experienced myalgias with higher doses of atorvastatin, has not taken this in three years.   2) GERD: Intermittent, overall better. Previously taking Prevacid BID. She is currently taking Zantac less than twice weekly on average.   3) Osteopenia: Diagnosed years ago. Last bone density scan was in 2010. She is not taking calcium with vitamin D. She is currently smoking and plans on quitting soon.   Review of Systems  Constitutional: Negative for fatigue.  Eyes: Negative for visual disturbance.  Respiratory: Negative for shortness of breath.   Cardiovascular: Positive for palpitations. Negative for chest pain.  Gastrointestinal: Negative for nausea.  Endocrine: Negative for cold intolerance.  Musculoskeletal: Negative for myalgias.  Skin: Negative for color change.  Neurological: Negative for dizziness and headaches.  Psychiatric/Behavioral:  The patient is not nervous/anxious.        Past Medical History:  Diagnosis Date  . Abnormal Pap smear of vagina    abnl paps of vaginal cuff; last pap 11/2003  . Cervical cancer ([place]) early 20's  . Fibromyalgia   . GERD (gastroesophageal reflux disease)   . Hyperlipidemia   . Hyperlipidemia   . MI (myocardial infarction) ([place]) age 15   in [place].  Dr. Mare Ferrari  . Osteopenia   . Premature menopause [age]  . Seasonal allergies    now mostly yearlong  . Seizure ([place]) 2010   per pt, related to lowered seizure threshold from imipramine and tramadol)--saw Dr. Jannifer Franklin  . Smoker      Social History   Social History  . Marital status: Single    Spouse name: N/A  . Number of children: 1  . Years of education: N/A   Occupational History  . 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   Social History Main Topics  . Smoking status: Current Every Day Smoker    Packs/day: 1.00    Years: 10.00    Types: Cigarettes  . Smokeless tobacco: Never Used  . Alcohol use 8.4 oz/week    14 Cans of beer per week     Comment: 2-3 beers per day.  . Drug use: No  . Sexual activity: Not Currently   Other Topics Concern  . Not on file   Social History Narrative   Lives at home with 35 year old autistic son.  5 cats, 1 dog.      Past Surgical History:  Procedure Laterality Date  . surgery for cervical cancer    . VAGINAL HYSTERECTOMY  fibroids  . WRIST SURGERY     deQuervains    Family History  Problem Relation Age of Onset  . Scoliosis Mother   . Cancer Father 75       testicular cancer  . Autism Son   . Cancer Maternal Uncle        esophageal (smoker)  . Macular degeneration Maternal Grandmother        wet  . Hypertension Maternal Grandmother   . Hyperlipidemia Maternal Grandmother   . Depression Maternal Grandmother        related to age, loss of vision  . Colitis Maternal Grandmother   . Diabetes Neg Hx     Allergies  Allergen Reactions  . Darvon [Propoxyphene  Hcl] Nausea And Vomiting    No current outpatient prescriptions on file prior to visit.   No current facility-administered medications on file prior to visit.     BP 122/74   Pulse 80   Temp 98.2 F (36.8 C) (Oral)   Ht 5' 5.25" (1.657 m)   Wt 111 lb 6.4 oz (50.5 kg)   SpO2 99%   BMI 18.40 kg/m    Objective:   Physical Exam  Constitutional: She is oriented to person, place, and time. She appears well-nourished.  Neck: Neck supple.  Cardiovascular: Normal rate and regular rhythm.   Pulmonary/Chest: Effort normal and breath sounds normal.  Musculoskeletal: Normal range of motion.  Neurological: She is alert and oriented to person, place, and time.  Skin: Skin is warm and dry.  Psychiatric: She has a normal mood and affect.          Assessment & Plan:  Tachycardia:  Intermittent for three days earlier this week. Exam today with normal rate. ECG: NSR with rate of 71. T-wave inversion to V1 and V2. No acute ST changes. No PAC/PVC. Check TSH, CBC, CMP to rule out metabolic cause. Consider cardiology evaluation once labs return.   Sheral Flow, NP

## 2017-07-12 ENCOUNTER — Other Ambulatory Visit: Payer: Self-pay | Admitting: Primary Care

## 2017-07-12 DIAGNOSIS — R000 Tachycardia, unspecified: Secondary | ICD-10-CM

## 2017-07-12 DIAGNOSIS — I2510 Atherosclerotic heart disease of native coronary artery without angina pectoris: Secondary | ICD-10-CM

## 2017-07-16 ENCOUNTER — Other Ambulatory Visit: Payer: Self-pay | Admitting: Primary Care

## 2017-07-16 DIAGNOSIS — Z1231 Encounter for screening mammogram for malignant neoplasm of breast: Secondary | ICD-10-CM

## 2017-07-30 ENCOUNTER — Encounter: Payer: Self-pay | Admitting: Cardiology

## 2017-08-06 ENCOUNTER — Ambulatory Visit
Admission: RE | Admit: 2017-08-06 | Discharge: 2017-08-06 | Disposition: A | Discharge status: Home / Self Care | Payer: 59 | Source: Ambulatory Visit | Attending: Primary Care | Admitting: Primary Care

## 2017-08-06 DIAGNOSIS — M8580 Other specified disorders of bone density and structure, unspecified site: Secondary | ICD-10-CM

## 2017-08-06 DIAGNOSIS — Z1231 Encounter for screening mammogram for malignant neoplasm of breast: Secondary | ICD-10-CM

## 2017-08-10 NOTE — Progress Notes (Signed)
Cardiology Office Note   Date:  08/12/2017   ID:  Amber Roberts, DOB April 12, 1961, MRN [id number]  PCP:  [name], NP  Cardiologist:   Minus Breeding, MD  Referring:  [name], NP  Chief Complaint  Patient presents with  . Coronary Artery Disease      History of Present Illness: Amber Roberts is a 56 y.o. female who presents for evaluation of reported CAD and tachycardia.  She was referred by [name], NP   She had previously seen Dr. Mare Ferrari.  There is a mention of CAD but she did not have a cath.  She said that [place] had an abnormal EKG in the hospital for four days thinking that she had had an MI but they never did a cath.  When she saw Dr. Mare Ferrari she apparently had a negative stress test although I don't have these records.  This was about 2009.  She not getting chest pain.     She has been getting palpitations. She reports that this happens often at night. She'll feel her heart racing and will go into the 130s. She has a blood pressure cuff the records this. It will stay that way for hours. She says she feels like she has increased adrenaline. She feels drained happens. She doesn't describe irregular beats. She doesn't describe an abrupt onset or offset. She's not able to cause this to happen. She doesn't exercise routinely but doesn't notice it necessarily with activity although she's had a heart rate goes up much faster with activity and she thinks it should. She's not describing chest pressure, neck or arm discomfort. She's had no new shortness of breath, PND or orthopnea. She's had no weight gain or edema.   Past Medical History:  Diagnosis Date  . Abnormal Pap smear of vagina    abnl paps of vaginal cuff; last pap 11/2003  . Cervical cancer ([place]) early 20's  . Chest pain   . Fibromyalgia   . GERD (gastroesophageal reflux disease)   . Hyperlipidemia   . Osteopenia   . Premature menopause 39-38   . Seasonal allergies    now mostly yearlong  . Seizure ([place]) 2010   per pt, related to lowered seizure threshold from imipramine and tramadol)--saw Dr. Jannifer Franklin  . Smoker     Past Surgical History:  Procedure Laterality Date  . surgery for cervical cancer    . VAGINAL HYSTERECTOMY     fibroids  . WRIST SURGERY     deQuervains     No current outpatient prescriptions on file.   No current facility-administered medications for this visit.     Allergies:   Darvon [propoxyphene hcl]    Social History:  The patient  reports that she has been smoking Cigarettes.  She has a 10.00 pack-year smoking history. She has never used smokeless tobacco. She reports that she drinks about 8.4 oz of alcohol per week . She reports that she does not use drugs.   Family History:  The patient's family history includes Autism in her son; Cancer in her maternal uncle; Cancer (age of onset: 21) in her father; Colitis in her maternal grandmother; Depression in her maternal grandmother; Hyperlipidemia in her maternal grandmother; Hypertension in her maternal grandmother; Macular degeneration in her maternal grandmother; Scoliosis in her mother.    ROS:  Please see the history of present illness.   Otherwise, review of systems are positive for none.  All other systems are reviewed and negative.    PHYSICAL EXAM: VS:  BP 136/80   Pulse 96   Ht 5\' 11"  (1.803 m)   Wt 111 lb (50.3 kg)   SpO2 97%   BMI 15.48 kg/m  , BMI Body mass index is 15.48 kg/m. GENERAL:  Well appearing HEENT:  Pupils equal round and reactive, fundi not visualized, oral mucosa unremarkable NECK:  No jugular venous distention, waveform within normal limits, carotid upstroke brisk and symmetric, no bruits, no thyromegaly LYMPHATICS:  No cervical, inguinal adenopathy LUNGS:  Clear to auscultation bilaterally BACK:  No CVA tenderness CHEST:  Unremarkable HEART:  PMI not displaced or sustained,S1 and S2 within normal limits, no S3, no  S4, no clicks, no rubs, no murmurs ABD:  Flat, positive bowel sounds normal in frequency in pitch, no bruits, no rebound, no guarding, no midline pulsatile mass, no hepatomegaly, no splenomegaly EXT:  2 plus pulses throughout, no edema, no cyanosis no clubbing SKIN:  No rashes no nodules NEURO:  Cranial nerves II through XII grossly intact, motor grossly intact throughout PSYCH:  Cognitively intact, oriented to person place and time    EKG:  EKG is not ordered today. The ekg ordered 07/09/17 demonstrates  sinus rhythm, rate 71, axis within normal limits, intervals within normal limits, no acute ST-T wave changes.    Recent Labs: 07/09/2017: ALT 26; BUN 6; Creatinine, Ser 0.78; Hemoglobin 14.6; Platelets 182.0; Potassium 3.8; Sodium 138; TSH 0.78    Lipid Panel    Component Value Date/Time   CHOL 232 (H) 07/09/2017 1041   TRIG 302.0 (H) 07/09/2017 1041   HDL 66.90 07/09/2017 1041   CHOLHDL 3 07/09/2017 1041   VLDL 60.4 (H) 07/09/2017 1041   LDLDIRECT 108.0 07/09/2017 1041      Wt Readings from Last 3 Encounters:  08/11/17 111 lb (50.3 kg)  07/09/17 111 lb 6.4 oz (50.5 kg)  09/12/12 120 lb (54.4 kg)      Other studies Reviewed: Additional studies/ records that were reviewed today include: Labs. Review of the above records demonstrates:  Please see elsewhere in the note.     ASSESSMENT AND PLAN:   CAD:  I don't think she has any obstructive coronary disease symptoms. However, she is going to have a  POET (Plain Old Exercise Treadmill) for reasons described below.   HTN:  The blood pressure is at target. No change in medications is indicated. We will continue with therapeutic lifestyle changes (TLC).  DYSLIPIDEMIA:  Her LDL recently was 108.  No change in therapy.   TACHYCARDIA:  I am going to bring her back for a POET (Plain Old Exercise Treadmill) to see if we can induce symptoms.  I will also ask her to buy and Alive Cor to see if we can captures these.  Further treatment  will be based on these results.  Of note labs, to include a TSH, were normal.    The following changes have been made:  no change  Labs/ tests ordered today include:   Orders Placed This Encounter  Procedures  . Exercise Tolerance Test     Disposition:   FU with me after the above studies.     Signed, Minus Breeding, MD  08/12/2017 8:50 PM    [facility]

## 2017-08-11 ENCOUNTER — Encounter (INDEPENDENT_AMBULATORY_CARE_PROVIDER_SITE_OTHER): Payer: Self-pay

## 2017-08-11 ENCOUNTER — Ambulatory Visit (INDEPENDENT_AMBULATORY_CARE_PROVIDER_SITE_OTHER): Payer: 59 | Admitting: Cardiology

## 2017-08-11 ENCOUNTER — Encounter: Payer: Self-pay | Admitting: Cardiology

## 2017-08-11 VITALS — BP 136/80 | HR 96 | Ht 71.0 in | Wt 111.0 lb

## 2017-08-11 DIAGNOSIS — I10 Essential (primary) hypertension: Secondary | ICD-10-CM | POA: Diagnosis not present

## 2017-08-11 DIAGNOSIS — R9431 Abnormal electrocardiogram [ECG] [EKG]: Secondary | ICD-10-CM

## 2017-08-11 DIAGNOSIS — E785 Hyperlipidemia, unspecified: Secondary | ICD-10-CM

## 2017-08-11 DIAGNOSIS — R000 Tachycardia, unspecified: Secondary | ICD-10-CM

## 2017-08-11 NOTE — Patient Instructions (Signed)
Medication Instructions:  Continue current medications  If you need a refill on your cardiac medications before your next appointment, please call your pharmacy.  Labwork: None Ordered   Testing/Procedures: Your physician has requested that you have an exercise tolerance test. For further information please visit [email or web address]. Please also follow instruction sheet, as given.  Follow-Up: Your physician wants you to follow-up in: After Test.    Special Instructions:  AliveCor  Thank you for choosing [facility] at [facility]!!    \

## 2017-08-12 ENCOUNTER — Encounter: Payer: Self-pay | Admitting: Cardiology

## 2017-08-12 DIAGNOSIS — R9431 Abnormal electrocardiogram [ECG] [EKG]: Secondary | ICD-10-CM

## 2017-08-12 HISTORY — DX: Abnormal electrocardiogram (ECG) (EKG): R94.31

## 2017-08-13 ENCOUNTER — Ambulatory Visit (INDEPENDENT_AMBULATORY_CARE_PROVIDER_SITE_OTHER): Payer: 59 | Admitting: Primary Care

## 2017-08-13 ENCOUNTER — Encounter: Payer: Self-pay | Admitting: Primary Care

## 2017-08-13 VITALS — BP 122/70 | HR 84 | Temp 98.0°F | Ht 65.25 in | Wt 113.4 lb

## 2017-08-13 DIAGNOSIS — Z0000 Encounter for general adult medical examination without abnormal findings: Secondary | ICD-10-CM | POA: Diagnosis not present

## 2017-08-13 DIAGNOSIS — Z1211 Encounter for screening for malignant neoplasm of colon: Secondary | ICD-10-CM | POA: Diagnosis not present

## 2017-08-13 DIAGNOSIS — I8393 Asymptomatic varicose veins of bilateral lower extremities: Secondary | ICD-10-CM

## 2017-08-13 DIAGNOSIS — M1990 Unspecified osteoarthritis, unspecified site: Secondary | ICD-10-CM | POA: Insufficient documentation

## 2017-08-13 DIAGNOSIS — M159 Polyosteoarthritis, unspecified: Secondary | ICD-10-CM

## 2017-08-13 DIAGNOSIS — I8390 Asymptomatic varicose veins of unspecified lower extremity: Secondary | ICD-10-CM | POA: Insufficient documentation

## 2017-08-13 DIAGNOSIS — E7800 Pure hypercholesterolemia, unspecified: Secondary | ICD-10-CM

## 2017-08-13 DIAGNOSIS — M8589 Other specified disorders of bone density and structure, multiple sites: Secondary | ICD-10-CM | POA: Diagnosis not present

## 2017-08-13 DIAGNOSIS — I2510 Atherosclerotic heart disease of native coronary artery without angina pectoris: Secondary | ICD-10-CM

## 2017-08-13 DIAGNOSIS — Z0001 Encounter for general adult medical examination with abnormal findings: Secondary | ICD-10-CM | POA: Insufficient documentation

## 2017-08-13 DIAGNOSIS — E785 Hyperlipidemia, unspecified: Secondary | ICD-10-CM

## 2017-08-13 NOTE — Assessment & Plan Note (Addendum)
Stable on recent bone density scan. Discussed calcium and vitamin D, she plans on starting.

## 2017-08-13 NOTE — Assessment & Plan Note (Signed)
Chronic to bilateral lower extremities for years. Wearing compression hose. Stands for most of the day. Discussed to elevate extremities when at rest, consider higher level of compression hose. She will be following with vein and vascular soon.

## 2017-08-13 NOTE — Patient Instructions (Addendum)
Start Fish Oil 1000 mg capsules for high triglycerides. Take 1 capsule twice daily with food.  Start Calcium with Vitamin D as discussed.  You will be contacted regarding your referral to GI for the colonoscopy.  Please let us know if you have not heard back within one week.   Schedule a lab only apppointment in 6 months to recheck your cholesterol.   Follow up in 1 year for your annual exam.  It was a pleasure to see you today!  Food Choices to Lower Your Triglycerides Triglycerides are a type of fat in your blood. High levels of triglycerides can increase the risk of heart disease and stroke. If your triglyceride levels are high, the foods you eat and your eating habits are very important. Choosing the right foods can help lower your triglycerides. What general guidelines do I need to follow?  Lose weight if you are overweight.  Limit or avoid alcohol.  Fill one half of your plate with vegetables and green salads.  Limit fruit to two servings a day. Choose fruit instead of juice.  Make one fourth of your plate whole grains. Look for the word "whole" as the first word in the ingredient list.  Fill one fourth of your plate with lean protein foods.  Enjoy fatty fish (such as salmon, mackerel, sardines, and tuna) three times a week.  Choose healthy fats.  Limit foods high in starch and sugar.  Eat more home-cooked food and less restaurant, buffet, and fast food.  Limit fried foods.  Cook foods using methods other than frying.  Limit saturated fats.  Check ingredient lists to avoid foods with partially hydrogenated oils (trans fats) in them. What foods can I eat? Grains Whole grains, such as whole wheat or whole grain breads, crackers, cereals, and pasta. Unsweetened oatmeal, bulgur, barley, quinoa, or brown rice. Corn or whole wheat flour tortillas. Vegetables Fresh or frozen vegetables (raw, steamed, roasted, or grilled). Green salads. Fruits All fresh, canned (in  natural juice), or frozen fruits. Meat and Other Protein Products Ground beef (85% or leaner), grass-fed beef, or beef trimmed of fat. Skinless chicken or [place]. Ground chicken or [place]. Pork trimmed of fat. All fish and seafood. Eggs. Dried beans, peas, or lentils. Unsalted nuts or seeds. Unsalted canned or dry beans. Dairy Low-fat dairy products, such as skim or 1% milk, 2% or reduced-fat cheeses, low-fat ricotta or cottage cheese, or plain low-fat yogurt. Fats and Oils Tub margarines without trans fats. Light or reduced-fat mayonnaise and salad dressings. Avocado. Safflower, olive, or canola oils. Natural peanut or almond butter. The items listed above may not be a complete list of recommended foods or beverages. Contact your dietitian for more options. What foods are not recommended? Grains White bread. White pasta. White rice. Cornbread. Bagels, pastries, and croissants. Crackers that contain trans fat. Vegetables White potatoes. Corn. Creamed or fried vegetables. Vegetables in a cheese sauce. Fruits Dried fruits. Canned fruit in light or heavy syrup. Fruit juice. Meat and Other Protein Products Fatty cuts of meat. Ribs, chicken wings, bacon, sausage, bologna, salami, chitterlings, fatback, hot dogs, bratwurst, and packaged luncheon meats. Dairy Whole or 2% milk, cream, half-and-half, and cream cheese. Whole-fat or sweetened yogurt. Full-fat cheeses. Nondairy creamers and whipped toppings. Processed cheese, cheese spreads, or cheese curds. Sweets and Desserts Corn syrup, sugars, honey, and molasses. Candy. Jam and jelly. Syrup. Sweetened cereals. Cookies, pies, cakes, donuts, muffins, and ice cream. Fats and Oils Butter, stick margarine, lard, shortening, ghee, or bacon fat. Coconut,  palm kernel, or palm oils. Beverages Alcohol. Sweetened drinks (such as sodas, lemonade, and fruit drinks or punches). The items listed above may not be a complete list of foods and beverages to avoid.  Contact your dietitian for more information. This information is not intended to replace advice given to you by your health care provider. Make sure you discuss any questions you have with your health care provider. Document Released: 08/06/2004 Document Revised: 03/26/2016 Document Reviewed: 08/23/2013 Elsevier Interactive Patient Education  2017 [employer].

## 2017-08-13 NOTE — Assessment & Plan Note (Signed)
Located to numerous joints. Discussed low impact exercise such as swimming, elliptical.  Aleve PRN. Continue to monitor.

## 2017-08-13 NOTE — Assessment & Plan Note (Signed)
Immunizations up-to-date. Mammogram and bone density testing up-to-date. Colonoscopy due, pending. Recommended to increase vegetables, fruits, whole gr, lean protein. Reduce fatty/fried foods. Exam unremarkable. Labs overall stable. Follow-up in one year.

## 2017-08-13 NOTE — Progress Notes (Signed)
Subjective:    Patient ID: Amber Roberts, female    DOB: November 23, 1960, 56 y.o.   MRN: [id number]  HPI  Amber Roberts is a 56 year old female who presents today for complete physical.  Immunizations: -Tetanus: Completed in 2013 -Influenza: Completed this season    Diet: Amber Roberts endorses a fair diet. Breakfast: Skips Lunch: Take out food (meat and gravy, vegetables, potatoes) Dinner: Meat, vegetable, starch Snacks: None Desserts: None  Beverages: Unsweet tea, water, soda  Exercise: Amber Roberts does not currently exercise.  Eye exam: Completed in 2018 Dental exam: Has not completed in several years.  Colonoscopy: Never completed, due.  Dexa: Osteopenia Pap Smear: Hysterectomy  Mammogram: Completed in 2018, negative.   Review of Systems  Constitutional: Negative for unexpected weight change.  HENT: Negative for rhinorrhea.   Respiratory: Negative for cough and shortness of breath.   Cardiovascular: Negative for chest pain.       Varicose veins and swelling to bilateral lower exremities.  Gastrointestinal: Negative for constipation and diarrhea.  Genitourinary: Negative for difficulty urinating and menstrual problem.  Musculoskeletal: Positive for arthralgias. Negative for myalgias.       Chronic arthralgias, increased pain to hands, neck, elbows, hips  Skin: Negative for rash.  Allergic/Immunologic: Negative for environmental allergies.  Neurological: Negative for dizziness, numbness and headaches.  Psychiatric/Behavioral:       Denies concerns for anxiety and depression       Past Medical History:  Diagnosis Date  . Abnormal Pap smear of vagina    abnl paps of vaginal cuff; last pap 11/2003  . Cervical cancer ([place]) early 20's  . Chest pain   . Fibromyalgia   . GERD (gastroesophageal reflux disease)   . Hyperlipidemia   . Osteopenia   . Premature menopause 37-38  . Seasonal allergies    now mostly yearlong  . Seizure ([place]) 2010   per pt, related to lowered seizure  threshold from imipramine and tramadol)--saw Dr. Jannifer Franklin  . Smoker      Social History   Social History  . Marital status: Single    Spouse name: N/A  . Number of children: 1  . Years of education: N/A   Occupational History  . [employer]   Social History Main Topics  . Smoking status: Current Every Day Smoker    Packs/day: 1.00    Years: 10.00    Types: Cigarettes  . Smokeless tobacco: Never Used  . Alcohol use 8.4 oz/week    14 Cans of beer per week     Comment: 2-3 beers per day.  . Drug use: No  . Sexual activity: Not Currently   Other Topics Concern  . Not on file   Social History Narrative   Lives at home with 30 year old autistic son.  5 cats, 1 dog.      Past Surgical History:  Procedure Laterality Date  . surgery for cervical cancer    . VAGINAL HYSTERECTOMY     fibroids  . WRIST SURGERY     deQuervains    Family History  Problem Relation Age of Onset  . Scoliosis Mother   . Cancer Father 22       testicular cancer  . Autism Son   . Cancer Maternal Uncle        esophageal (smoker)  . Macular degeneration Maternal Grandmother        wet  . Hypertension Maternal Grandmother   . Hyperlipidemia Maternal Grandmother   . Depression Maternal  Grandmother        related to age, loss of vision  . Colitis Maternal Grandmother   . Diabetes Neg Hx   . Breast cancer Neg Hx     Allergies  Allergen Reactions  . Darvon [Propoxyphene Hcl] Nausea And Vomiting    No current outpatient prescriptions on file prior to visit.   No current facility-administered medications on file prior to visit.     BP 122/70   Pulse 84   Temp 98 F (36.7 C) (Oral)   Ht 5' 5.25" (1.657 m)   Wt 113 lb 6.4 oz (51.4 kg)   SpO2 98%   BMI 18.73 kg/m    Objective:   Physical Exam  Constitutional: Amber Roberts is oriented to person, place, and time. Amber Roberts appears well-nourished.  HENT:  Right Ear: Tympanic membrane and ear canal normal.  Left Ear: Tympanic membrane  and ear canal normal.  Nose: Nose normal.  Mouth/Throat: Oropharynx is clear and moist.  Eyes: Pupils are equal, round, and reactive to light. Conjunctivae and EOM are normal.  Neck: Neck supple. No thyromegaly present.  Cardiovascular: Normal rate and regular rhythm.   No murmur heard. Varicose veins to bilateral lower extremities.  No lower extremity edema.  Pulmonary/Chest: Effort normal and breath sounds normal. Amber Roberts has no rales.  Abdominal: Soft. Bowel sounds are normal. There is no tenderness.  Musculoskeletal: Normal range of motion.  Lymphadenopathy:    Amber Roberts has no cervical adenopathy.  Neurological: Amber Roberts is alert and oriented to person, place, and time. Amber Roberts has normal reflexes. No cranial nerve deficit.  Skin: Skin is warm and dry. No rash noted.  Psychiatric: Amber Roberts has a normal mood and affect.          Assessment & Plan:

## 2017-08-13 NOTE — Assessment & Plan Note (Signed)
TC slightly above goal (HDL at 66, LDL at 108). Trigs above goal.  Start Fish Oil 1000 mg BID with meals. Discussed to work on diet by limiting fatty/processed foods. Information provided regarding lowering triglycerides through diet. Recheck lipids in 6 months.

## 2017-08-13 NOTE — Assessment & Plan Note (Signed)
Recently evaluated by cardiology, will undergo stress test soon. Discussed lipid control through improvement in diet.

## 2017-08-18 ENCOUNTER — Telehealth (HOSPITAL_COMMUNITY): Payer: Self-pay

## 2017-08-18 NOTE — Telephone Encounter (Signed)
Encounter complete. 

## 2017-08-20 ENCOUNTER — Ambulatory Visit (HOSPITAL_COMMUNITY)
Admission: RE | Admit: 2017-08-20 | Discharge: 2017-08-20 | Disposition: A | Discharge status: Home / Self Care | Payer: 59 | Source: Ambulatory Visit | Attending: Cardiovascular Disease | Admitting: Cardiovascular Disease

## 2017-08-20 DIAGNOSIS — R000 Tachycardia, unspecified: Secondary | ICD-10-CM | POA: Diagnosis not present

## 2017-08-20 LAB — EXERCISE TOLERANCE TEST
CHL CUP MPHR: 166 {beats}/min
CHL CUP RESTING HR STRESS: 97 {beats}/min
CSEPPHR: 155 {beats}/min
Estimated workload: 8.5 METS
Exercise duration (min): 7 min
Exercise duration (sec): 0 s
Percent HR: 93 %
RPE: 18

## 2017-08-26 ENCOUNTER — Telehealth: Payer: Self-pay | Admitting: Cardiology

## 2017-08-26 NOTE — Telephone Encounter (Signed)
Amber Roberts is calling to get her GXT results , she is asking that you leave the results on her voicemail because she is not always able to answer the phone . Thanks

## 2017-08-26 NOTE — Telephone Encounter (Signed)
Returned the call to the patient. She verbalized her understanding.  Notes recorded by Minus Breeding, MD on 08/24/2017 at 1:27 PM EDT Negative adequate POET (Plain Old Exercise Treadmill). Call [name] with the results and send results to [name], NP

## 2017-08-27 ENCOUNTER — Encounter: Payer: Self-pay | Admitting: *Deleted

## 2017-09-17 ENCOUNTER — Encounter: Payer: Self-pay | Admitting: Primary Care

## 2017-09-28 NOTE — Progress Notes (Signed)
Cardiology Office Note   Date:  09/29/2017   ID:  Amber Roberts, DOB [birth year], MRN [id number]  PCP:  [name], NP  Cardiologist:   Minus Breeding, MD  Referring:  [name], NP  Chief Complaint  Patient presents with  . Palpitations      History of Present Illness: Amber Roberts is a 56 y.o. female who presents for evaluation of reported CAD and tachycardia.  She was referred by [name], NP   She had previously seen Dr. Mare Ferrari.  There is a mention of CAD but she did not have a cath.  She said [place] had an abnormal EKG in the hospital for four days thinking that she had had an MI but they never did a cath.  When she saw Dr. Mare Ferrari she apparently had a negative stress test although I don't have these records.  This was about 2009.  When I saw her at my first visit with her last month I ordered a POET (Plain Old Exercise Treadmill) which was negative.  She was having some palpitations as well.    Since the stress test she has done well.  She is having fewer palpitations.  The patient denies any new symptoms such as chest discomfort, neck or arm discomfort. There has been no new shortness of breath, PND or orthopnea. There have been no reported palpitations, presyncope or syncope.   Past Medical History:  Diagnosis Date  . Abnormal Pap smear of vagina    abnl paps of vaginal cuff; last pap 11/2003  . Cervical cancer ([place]) early 20's  . Chest pain   . Fibromyalgia   . GERD (gastroesophageal reflux disease)   . Hyperlipidemia   . Osteopenia   . Premature menopause 37-38  . Seasonal allergies    now mostly yearlong  . Seizure ([place]) 2010   per pt, related to lowered seizure threshold from imipramine and tramadol)--saw Dr. Jannifer Franklin  . Smoker     Past Surgical History:  Procedure Laterality Date  . surgery for cervical cancer    . VAGINAL HYSTERECTOMY     fibroids  . WRIST SURGERY      deQuervains     Current Outpatient Medications  Medication Sig Dispense Refill  . lansoprazole (PREVACID) 15 MG capsule Take 15 mg by mouth daily at 12 noon.     No current facility-administered medications for this visit.     Allergies:   Darvon [propoxyphene hcl]     ROS:  Please see the history of present illness.   Otherwise, review of systems are positive for none.   All other systems are reviewed and negative.    PHYSICAL EXAM: VS:  BP 133/84 (BP Location: Left Arm)   Pulse 80   Ht 5\' 5"  (1.651 m)   Wt 111 lb 6.4 oz (50.5 kg)   SpO2 97%   BMI 18.54 kg/m  , BMI Body mass index is 18.54 kg/m.  GENERAL:  Well appearing NECK:  No jugular venous distention, waveform within normal limits, carotid upstroke brisk and symmetric, no bruits, no thyromegaly LUNGS:  Clear to auscultation bilaterally CHEST:  Unremarkable HEART:  PMI not displaced or sustained,S1 and S2 within normal limits, no S3, no S4, no clicks, no rubs, no murmurs ABD:  Flat, positive bowel sounds normal in frequency in pitch, no bruits, no rebound, no guarding, no midline pulsatile mass, no hepatomegaly, no splenomegaly EXT:  2  plus pulses throughout, no edema, no cyanosis no clubbing    EKG:  EKG is not  ordered today.    Recent Labs: 07/09/2017: ALT 26; BUN 6; Creatinine, Ser 0.78; Hemoglobin 14.6; Platelets 182.0; Potassium 3.8; Sodium 138; TSH 0.78    Lipid Panel    Component Value Date/Time   CHOL 232 (H) 07/09/2017 1041   TRIG 302.0 (H) 07/09/2017 1041   HDL 66.90 07/09/2017 1041   CHOLHDL 3 07/09/2017 1041   VLDL 60.4 (H) 07/09/2017 1041   LDLDIRECT 108.0 07/09/2017 1041      Wt Readings from Last 3 Encounters:  09/29/17 111 lb 6.4 oz (50.5 kg)  08/13/17 113 lb 6.4 oz (51.4 kg)  08/11/17 111 lb (50.3 kg)      Other studies Reviewed: Additional studies/ records that were reviewed today include:  POET (Plain Old Exercise Treadmill).  . Review of the above records demonstrates:       ASSESSMENT AND PLAN:   ABNORMAL EKG:  She had a negative POET (Plain Old Exercise Treadmill).  She does have an abnormal EKG with anterior Q waves but she says this has been chronic for years.  She has no symptoms and no further testing is indicated.   HTN:  The blood pressure is at target but creeping up.  She will watch this over time.   DYSLIPIDEMIA:  Her triglycerides were elevated.  I will defer to [name], NP  TACHYCARDIA:   I asked her to by an Alive Cor.  She still plans to get this.  However, because the symptoms are improved she does not want therapy at this time.  She will bring reports if she has increased palpitations.   TOBACCO:  She understands the need to not smoke but she has significant stress in her life.    The following changes have been made:  None  Labs/ tests ordered today include:  None  No orders of the defined types were placed in this encounter.    Disposition:   FU with me as needed.     Signed, Minus Breeding, MD  09/29/2017 11:21 AM    [place]

## 2017-09-29 ENCOUNTER — Ambulatory Visit: Payer: 59 | Admitting: Cardiology

## 2017-09-29 ENCOUNTER — Encounter: Payer: Self-pay | Admitting: Cardiology

## 2017-09-29 ENCOUNTER — Encounter (INDEPENDENT_AMBULATORY_CARE_PROVIDER_SITE_OTHER): Payer: Self-pay

## 2017-09-29 VITALS — BP 133/84 | HR 80 | Ht 65.0 in | Wt 111.4 lb

## 2017-09-29 DIAGNOSIS — R002 Palpitations: Secondary | ICD-10-CM | POA: Insufficient documentation

## 2017-09-29 DIAGNOSIS — R9431 Abnormal electrocardiogram [ECG] [EKG]: Secondary | ICD-10-CM | POA: Diagnosis not present

## 2017-09-29 NOTE — Patient Instructions (Signed)
Medication Instructions:  Continue current medications  If you need a refill on your cardiac medications before your next appointment, please call your pharmacy.  Labwork: None Ordered  Testing/Procedures: None Ordered  Follow-Up: Your physician wants you to follow-up in: As Needed.      Thank you for choosing [facility]!!       

## 2018-02-11 ENCOUNTER — Other Ambulatory Visit (INDEPENDENT_AMBULATORY_CARE_PROVIDER_SITE_OTHER): Payer: 59

## 2018-02-11 DIAGNOSIS — E785 Hyperlipidemia, unspecified: Secondary | ICD-10-CM

## 2018-02-11 LAB — LIPID PANEL
Cholesterol: 184 mg/dL (ref 0–200)
HDL: 71.2 mg/dL (ref >39.00)
NONHDL: 112.73
Total CHOL/HDL Ratio: 3
Triglycerides: 279 mg/dL — ABNORMAL HIGH (ref 0.0–149.0)
VLDL: 55.8 mg/dL — ABNORMAL HIGH (ref 0.0–40.0)

## 2018-02-11 LAB — LDL CHOLESTEROL, DIRECT: LDL DIRECT: 77 mg/dL

## 2018-04-01 ENCOUNTER — Ambulatory Visit (INDEPENDENT_AMBULATORY_CARE_PROVIDER_SITE_OTHER)
Admission: RE | Admit: 2018-04-01 | Discharge: 2018-04-01 | Disposition: A | Discharge status: Home / Self Care | Payer: 59 | Source: Ambulatory Visit | Attending: Primary Care | Admitting: Primary Care

## 2018-04-01 ENCOUNTER — Encounter: Payer: Self-pay | Admitting: Primary Care

## 2018-04-01 ENCOUNTER — Ambulatory Visit: Payer: 59 | Admitting: Primary Care

## 2018-04-01 VITALS — BP 124/80 | HR 82 | Temp 98.0°F | Ht 65.0 in | Wt 107.2 lb

## 2018-04-01 DIAGNOSIS — R233 Spontaneous ecchymoses: Secondary | ICD-10-CM

## 2018-04-01 DIAGNOSIS — M79605 Pain in left leg: Secondary | ICD-10-CM

## 2018-04-01 DIAGNOSIS — G8929 Other chronic pain: Secondary | ICD-10-CM | POA: Diagnosis not present

## 2018-04-01 DIAGNOSIS — M542 Cervicalgia: Secondary | ICD-10-CM | POA: Diagnosis not present

## 2018-04-01 DIAGNOSIS — M79604 Pain in right leg: Secondary | ICD-10-CM

## 2018-04-01 HISTORY — DX: Pain in right leg: M79.604

## 2018-04-01 LAB — COMPREHENSIVE METABOLIC PANEL
ALBUMIN: 4.5 g/dL (ref 3.5–5.2)
ALT: 21 U/L (ref 0–35)
AST: 29 U/L (ref 0–37)
Alkaline Phosphatase: 79 U/L (ref 39–117)
BUN: 5 mg/dL — AB (ref 6–23)
CHLORIDE: 101 meq/L (ref 96–112)
CO2: 28 meq/L (ref 19–32)
Calcium: 9.8 mg/dL (ref 8.4–10.5)
Creatinine, Ser: 0.7 mg/dL (ref 0.40–1.20)
GFR: 92.24 mL/min (ref >60.00)
Glucose, Bld: 109 mg/dL — ABNORMAL HIGH (ref 70–99)
Potassium: 4.2 mEq/L (ref 3.5–5.1)
SODIUM: 136 meq/L (ref 135–145)
Total Bilirubin: 0.6 mg/dL (ref 0.2–1.2)
Total Protein: 7.7 g/dL (ref 6.0–8.3)

## 2018-04-01 LAB — CBC
HEMATOCRIT: 43.4 % (ref 36.0–46.0)
Hemoglobin: 14.9 g/dL (ref 12.0–15.0)
MCHC: 34.4 g/dL (ref 30.0–36.0)
MCV: 100.7 fl — AB (ref 78.0–100.0)
Platelets: 176 10*3/uL (ref 150.0–400.0)
RBC: 4.31 Mil/uL (ref 3.87–5.11)
RDW: 13.8 % (ref 11.5–15.5)
WBC: 5.6 10*3/uL (ref 4.0–10.5)

## 2018-04-01 NOTE — Assessment & Plan Note (Signed)
Present for years, history of bone spurs in the past. Repeat plain films of cervical spine. Consider PT vs orthopedic referral based off of xray results.

## 2018-04-01 NOTE — Progress Notes (Signed)
Subjective:    Patient ID: Amber Roberts, female    DOB: [birth year], 57 y.o.   MRN: [id number]  HPI  Amber Roberts is a 57 year old female with a history of varicose veins who presents today with multiple complaints.  1) Skin Rash: Amber Roberts reports developing petechiae in January 2019 after wearing thin tighter socks. Over the last month Amber Roberts's noticed gradual improvement in petechiae. Amber Roberts's mostly concerned about potential circulatory issues as Amber Roberts's noticing right lower extremity pain from her calf with radiation to her upper lower extremity and sometimes through her hip. Amber Roberts was walking her friend's dog last week and noticed that her veins were engorged to her lower extremities, this has happened before. It was hot outside.  Her right lower extremity has been "aching" and painful with both rest and ambulation, some improvement with rest. Amber Roberts does have some left lower extremity discomfort but not as worse as the right. Amber Roberts stands a lot during her occupation. Amber Roberts's smoked cigarettes off and on since age 8. Amber Roberts quit smoking for 7 years, started back in 2003. Amber Roberts is smoking slightly less than 1 PPD.   Amber Roberts does have chronic right hip and bilateral lower back pain, but her lower extremity pain feels different.   2) Neck Pain: History of osteoarthritis. Chronic neck pain over the last several years. Amber Roberts was told around age 43 that Amber Roberts had bone spurs on her cervical spine. Over the past 2 weeks Amber Roberts's noticed pain to her mid cervical spine with radiation down her left shoulder and up to her left head. Amber Roberts does notice numbness/tingling when Amber Roberts has her head flexed for any period of time.   Amber Roberts denies recent trauma/injury, weakness.   Review of Systems  Constitutional: Negative for fever.  Cardiovascular: Negative for chest pain.  Musculoskeletal: Positive for arthralgias.  Skin:       Petechiae to lower extremities.  Neurological: Positive for numbness.       Past Medical History:  Diagnosis  Date  . Abnormal Pap smear of vagina    abnl paps of vaginal cuff; last pap 11/2003  . Cervical cancer ([place]) early 20's  . Chest pain   . Fibromyalgia   . GERD (gastroesophageal reflux disease)   . Hyperlipidemia   . Osteopenia   . Premature menopause 37-38  . Seasonal allergies    now mostly yearlong  . Seizure ([place]) 2010   per pt, related to lowered seizure threshold from imipramine and tramadol)--saw Dr. Jannifer Franklin  . Smoker      Social History   Socioeconomic History  . Marital status: Single    Spouse name: Not on file  . Number of children: 1  . Years of education: Not on file  . Highest education level: Not on file  Occupational History  . 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 Financial resource strain: Not on file  . Food insecurity:    Worry: Not on file    Inability: Not on file  . Transportation needs:    Medical: Not on file    Non-medical: Not on file  Tobacco Use  . Smoking status: Current Every Day Smoker    Packs/day: 1.00    Years: 10.00    Pack years: 10.00    Types: Cigarettes  . Smokeless tobacco: Never Used  Substance and Sexual Activity  . Alcohol use: Yes    Alcohol/week: 8.4 oz    Types: 14 Cans of beer per week  Comment: 2-3 beers per day.  . Drug use: No  . Sexual activity: Not Currently  Lifestyle  . Physical activity:    Days per week: Not on file    Minutes per session: Not on file  . Stress: Not on file  Relationships  . Social connections:    Talks on phone: Not on file    Gets together: Not on file    Attends religious service: Not on file    Active member of club or organization: Not on file    Attends meetings of clubs or organizations: Not on file    Relationship status: Not on file  . Intimate partner violence:    Fear of current or ex partner: Not on file    Emotionally abused: Not on file    Physically abused: Not on file    Forced sexual activity: Not on file  Other Topics Concern  .  Not on file  Social History Narrative   Lives at home with 49 year old autistic son.  5 cats, 1 dog.      Past Surgical History:  Procedure Laterality Date  . surgery for cervical cancer    . VAGINAL HYSTERECTOMY     fibroids  . WRIST SURGERY     deQuervains    Family History  Problem Relation Age of Onset  . Scoliosis Mother   . Cancer Father 74       testicular cancer  . Autism Son   . Cancer Maternal Uncle        esophageal (smoker)  . Macular degeneration Maternal Grandmother        wet  . Hypertension Maternal Grandmother   . Hyperlipidemia Maternal Grandmother   . Depression Maternal Grandmother        related to age, loss of vision  . Colitis Maternal Grandmother   . Diabetes Neg Hx   . Breast cancer Neg Hx     Allergies  Allergen Reactions  . Darvon [Propoxyphene Hcl] Nausea And Vomiting    No current outpatient medications on file prior to visit.   No current facility-administered medications on file prior to visit.     BP 124/80   Pulse 82   Temp 98 F (36.7 C) (Oral)   Ht 5\' 5"  (1.651 m)   Wt 107 lb 4 oz (48.6 kg)   SpO2 98%   BMI 17.85 kg/m    Objective:   Physical Exam  Constitutional: Amber Roberts appears well-nourished.  Cardiovascular: Normal rate and regular rhythm.  Pulses:      Dorsalis pedis pulses are 2+ on the right side, and 2+ on the left side.       Posterior tibial pulses are 2+ on the right side, and 2+ on the left side.  Musculoskeletal:       Cervical back: Amber Roberts exhibits pain. Amber Roberts exhibits normal range of motion and no tenderness.       Back:  Skin: Skin is warm and dry.  No obvious petechiae noted on exam, does have numerous freckles to lower extremities.            Assessment & Plan:  Petechiae:  Present since January, gradually improved over last month. No obvious petechiae on exam. Will check CBC and CMP to rule out any metabolic cause.  [name], NP

## 2018-04-01 NOTE — Assessment & Plan Note (Signed)
Present for months, increased over last 2 weeks.  Exam today with 2+ pedal pulses bilaterally. Symptoms could be secondary to vascular or orthopedic cause.   Given history of tobacco abuse and CAD will check ABI's to rule out vascular cause. If ABI's negative then consider lumbar spine vs hip involvement.

## 2018-04-01 NOTE — Patient Instructions (Signed)
Complete xray(s) and labs prior to leaving today. I will notify you of your results once received.  You will be contacted regarding your doppler study.  Please let us know if you have not been contacted within one week.   It was a pleasure to see you today!

## 2018-04-04 ENCOUNTER — Encounter: Payer: Self-pay | Admitting: *Deleted

## 2018-04-08 ENCOUNTER — Ambulatory Visit (HOSPITAL_COMMUNITY)
Admission: RE | Admit: 2018-04-08 | Discharge: 2018-04-08 | Disposition: A | Discharge status: Home / Self Care | Payer: 59 | Source: Ambulatory Visit | Attending: Cardiology | Admitting: Cardiology

## 2018-04-08 DIAGNOSIS — M79605 Pain in left leg: Secondary | ICD-10-CM | POA: Insufficient documentation

## 2018-04-08 DIAGNOSIS — F17200 Nicotine dependence, unspecified, uncomplicated: Secondary | ICD-10-CM | POA: Insufficient documentation

## 2018-04-08 DIAGNOSIS — M79604 Pain in right leg: Secondary | ICD-10-CM | POA: Diagnosis not present

## 2018-04-08 DIAGNOSIS — I2510 Atherosclerotic heart disease of native coronary artery without angina pectoris: Secondary | ICD-10-CM | POA: Insufficient documentation

## 2018-04-08 DIAGNOSIS — E785 Hyperlipidemia, unspecified: Secondary | ICD-10-CM | POA: Insufficient documentation

## 2018-04-25 ENCOUNTER — Encounter: Payer: Self-pay | Admitting: Primary Care

## 2018-04-25 DIAGNOSIS — M542 Cervicalgia: Principal | ICD-10-CM

## 2018-04-25 DIAGNOSIS — G8929 Other chronic pain: Secondary | ICD-10-CM

## 2018-04-26 MED ORDER — PREDNISONE 20 MG PO TABS: ORAL_TABLET | ORAL | 0 refills | Status: DC

## 2018-05-09 ENCOUNTER — Encounter: Payer: Self-pay | Admitting: Primary Care

## 2018-05-12 ENCOUNTER — Encounter: Payer: Self-pay | Admitting: Primary Care

## 2018-05-12 DIAGNOSIS — M542 Cervicalgia: Principal | ICD-10-CM

## 2018-05-12 DIAGNOSIS — G8929 Other chronic pain: Secondary | ICD-10-CM

## 2018-05-12 MED ORDER — TRAMADOL HCL 50 MG PO TABS: 50.0000 mg | ORAL_TABLET | Freq: Three times a day (TID) | ORAL | 0 refills | Status: DC | PRN

## 2018-06-06 ENCOUNTER — Other Ambulatory Visit: Payer: Self-pay | Admitting: Neurosurgery

## 2018-06-06 DIAGNOSIS — M4722 Other spondylosis with radiculopathy, cervical region: Secondary | ICD-10-CM

## 2018-06-10 ENCOUNTER — Ambulatory Visit
Admission: RE | Admit: 2018-06-10 | Discharge: 2018-06-10 | Disposition: A | Discharge status: Home / Self Care | Payer: 59 | Source: Ambulatory Visit | Attending: Neurosurgery | Admitting: Neurosurgery

## 2018-06-10 DIAGNOSIS — M4722 Other spondylosis with radiculopathy, cervical region: Secondary | ICD-10-CM

## 2018-06-16 DIAGNOSIS — Z87898 Personal history of other specified conditions: Secondary | ICD-10-CM

## 2018-06-16 MED ORDER — SCOPOLAMINE 1 MG/3DAYS TD PT72: 1.0000 | MEDICATED_PATCH | TRANSDERMAL | 0 refills | Status: DC

## 2018-07-25 ENCOUNTER — Other Ambulatory Visit: Payer: Self-pay | Admitting: Primary Care

## 2018-07-25 DIAGNOSIS — Z1231 Encounter for screening mammogram for malignant neoplasm of breast: Secondary | ICD-10-CM

## 2018-08-12 ENCOUNTER — Ambulatory Visit
Admission: RE | Admit: 2018-08-12 | Discharge: 2018-08-12 | Disposition: A | Discharge status: Home / Self Care | Payer: 59 | Source: Ambulatory Visit | Attending: Primary Care | Admitting: Primary Care

## 2018-08-12 DIAGNOSIS — Z1231 Encounter for screening mammogram for malignant neoplasm of breast: Secondary | ICD-10-CM | POA: Insufficient documentation

## 2018-08-19 ENCOUNTER — Encounter: Payer: 59 | Admitting: Primary Care

## 2018-08-26 ENCOUNTER — Ambulatory Visit (INDEPENDENT_AMBULATORY_CARE_PROVIDER_SITE_OTHER): Payer: 59 | Admitting: Primary Care

## 2018-08-26 ENCOUNTER — Encounter: Payer: Self-pay | Admitting: Primary Care

## 2018-08-26 VITALS — BP 128/80 | HR 84 | Temp 98.2°F | Ht 65.0 in | Wt 111.5 lb

## 2018-08-26 DIAGNOSIS — F17200 Nicotine dependence, unspecified, uncomplicated: Secondary | ICD-10-CM

## 2018-08-26 DIAGNOSIS — K219 Gastro-esophageal reflux disease without esophagitis: Secondary | ICD-10-CM

## 2018-08-26 DIAGNOSIS — G8929 Other chronic pain: Secondary | ICD-10-CM

## 2018-08-26 DIAGNOSIS — M542 Cervicalgia: Secondary | ICD-10-CM | POA: Diagnosis not present

## 2018-08-26 DIAGNOSIS — I2510 Atherosclerotic heart disease of native coronary artery without angina pectoris: Secondary | ICD-10-CM

## 2018-08-26 DIAGNOSIS — R002 Palpitations: Secondary | ICD-10-CM

## 2018-08-26 DIAGNOSIS — Z1159 Encounter for screening for other viral diseases: Secondary | ICD-10-CM

## 2018-08-26 DIAGNOSIS — Z23 Encounter for immunization: Secondary | ICD-10-CM

## 2018-08-26 DIAGNOSIS — E7800 Pure hypercholesterolemia, unspecified: Secondary | ICD-10-CM

## 2018-08-26 DIAGNOSIS — M8589 Other specified disorders of bone density and structure, multiple sites: Secondary | ICD-10-CM

## 2018-08-26 DIAGNOSIS — Z0000 Encounter for general adult medical examination without abnormal findings: Secondary | ICD-10-CM

## 2018-08-26 LAB — COMPREHENSIVE METABOLIC PANEL
ALK PHOS: 72 U/L (ref 39–117)
ALT: 19 U/L (ref 0–35)
AST: 24 U/L (ref 0–37)
Albumin: 4.5 g/dL (ref 3.5–5.2)
BILIRUBIN TOTAL: 0.7 mg/dL (ref 0.2–1.2)
BUN: 4 mg/dL — AB (ref 6–23)
CO2: 26 meq/L (ref 19–32)
CREATININE: 0.66 mg/dL (ref 0.40–1.20)
Calcium: 9.3 mg/dL (ref 8.4–10.5)
Chloride: 100 mEq/L (ref 96–112)
GFR: 98.57 mL/min (ref >60.00)
GLUCOSE: 84 mg/dL (ref 70–99)
Potassium: 3.9 mEq/L (ref 3.5–5.1)
SODIUM: 135 meq/L (ref 135–145)
TOTAL PROTEIN: 7.3 g/dL (ref 6.0–8.3)

## 2018-08-26 LAB — LIPID PANEL
Cholesterol: 220 mg/dL — ABNORMAL HIGH (ref 0–200)
HDL: 81.9 mg/dL (ref >39.00)
NONHDL: 138.01
Total CHOL/HDL Ratio: 3
Triglycerides: 209 mg/dL — ABNORMAL HIGH (ref 0.0–149.0)
VLDL: 41.8 mg/dL — ABNORMAL HIGH (ref 0.0–40.0)

## 2018-08-26 LAB — LDL CHOLESTEROL, DIRECT: Direct LDL: 114 mg/dL

## 2018-08-26 MED ORDER — ZOSTER VAC RECOMB ADJUVANTED 50 MCG/0.5ML IM SUSR: 0.5000 mL | Freq: Once | INTRAMUSCULAR | 1 refills | Status: AC

## 2018-08-26 MED ORDER — TRAMADOL HCL 50 MG PO TABS: 50.0000 mg | ORAL_TABLET | Freq: Two times a day (BID) | ORAL | 0 refills | Status: DC | PRN

## 2018-08-26 MED ORDER — PREDNISONE 20 MG PO TABS: ORAL_TABLET | ORAL | 0 refills | Status: DC

## 2018-08-26 NOTE — Assessment & Plan Note (Signed)
Asymptomatic. Following with cardiology.

## 2018-08-26 NOTE — Assessment & Plan Note (Signed)
Not taking calcium/vitamin D. Discussed to start 1200 mg of calcium and 800 units of vitamin D. Repeat bone density scan in 2020.

## 2018-08-26 NOTE — Assessment & Plan Note (Signed)
Smoking 1 pack per day. Increased stress, not ready to quit. Discussed to stop smoking. Referral placed for lung cancer screening.

## 2018-08-26 NOTE — Assessment & Plan Note (Signed)
Following with cardiology who completed work up which was unremarkable. No etiology for palpitations. Continue to monitor.

## 2018-08-26 NOTE — Assessment & Plan Note (Signed)
Infrequent episodes, using Zantac PRN.

## 2018-08-26 NOTE — Assessment & Plan Note (Signed)
Repeat lipids pending. Discussed to work on diet, start exercising.

## 2018-08-26 NOTE — Assessment & Plan Note (Signed)
Td and influenza UTD. Rx for Shingrix provided. Mammogram and bone density tests UTD. Colon cancer screening due, opts for cologuard. Discussed to improve diet, get regular exercise. Exam stable. Labs pending. Follow up in 1 year for CPE.

## 2018-08-26 NOTE — Patient Instructions (Signed)
Stop by the lab prior to leaving today. I will notify you of your results once received.   You will be contacted regarding your referral to neurosurgery and for lung cancer screening.  Please let us know if you have not been contacted within one week.   Take the shingles vaccination to the pharmacy for administration.  Start prednisone. Take 3 tablets for 3 days, then 2 tablets for 3 days, then 1 tablet for 3 days.  Use the Tramadol sparingly as discussed.  It's important to improve your diet by reducing consumption of fast food, fried food, processed snack foods, sugary drinks. Increase consumption of fresh vegetables and fruits, whole grains, water.  Ensure you are drinking 64 ounces of water daily.  We will see you in one year for your annual exam or sooner if needed.   Preventive Care 40-64 Years, Female Preventive care refers to lifestyle choices and visits with your health care provider that can promote health and wellness. What does preventive care include?  A yearly physical exam. This is also called an annual well check.  Dental exams once or twice a year.  Routine eye exams. Ask your health care provider how often you should have your eyes checked.  Personal lifestyle choices, including: ? Daily care of your teeth and gums. ? Regular physical activity. ? Eating a healthy diet. ? Avoiding tobacco and drug use. ? Limiting alcohol use. ? Practicing safe sex. ? Taking low-dose aspirin daily starting at age 70. ? Taking vitamin and mineral supplements as recommended by your health care provider. What happens during an annual well check? The services and screenings done by your health care provider during your annual well check will depend on your age, overall health, lifestyle risk factors, and family history of disease. Counseling Your health care provider may ask you questions about your:  Alcohol use.  Tobacco use.  Drug use.  Emotional well-being.  Home and  relationship well-being.  Sexual activity.  Eating habits.  Work and work Statistician.  Method of birth control.  Menstrual cycle.  Pregnancy history.  Screening You may have the following tests or measurements:  Height, weight, and BMI.  Blood pressure.  Lipid and cholesterol levels. These may be checked every 5 years, or more frequently if you are over 101 years old.  Skin check.  Lung cancer screening. You may have this screening every year starting at age 27 if you have a 30-pack-year history of smoking and currently smoke or have quit within the past 15 years.  Fecal occult blood test (FOBT) of the stool. You may have this test every year starting at age 21.  Flexible sigmoidoscopy or colonoscopy. You may have a sigmoidoscopy every 5 years or a colonoscopy every 10 years starting at age 98.  Hepatitis C blood test.  Hepatitis B blood test.  Sexually transmitted disease (STD) testing.  Diabetes screening. This is done by checking your blood sugar (glucose) after you have not eaten for a while (fasting). You may have this done every 1-3 years.  Mammogram. This may be done every 1-2 years. Talk to your health care provider about when you should start having regular mammograms. This may depend on whether you have a family history of breast cancer.  BRCA-related cancer screening. This may be done if you have a family history of breast, ovarian, tubal, or peritoneal cancers.  Pelvic exam and Pap test. This may be done every 3 years starting at age 32. Starting at age 52,  this may be done every 5 years if you have a Pap test in combination with an HPV test.  Bone density scan. This is done to screen for osteoporosis. You may have this scan if you are at high risk for osteoporosis.  Discuss your test results, treatment options, and if necessary, the need for more tests with your health care provider. Vaccines Your health care provider may recommend certain vaccines, such  as:  Influenza vaccine. This is recommended every year.  Tetanus, diphtheria, and acellular pertussis (Tdap, Td) vaccine. You may need a Td booster every 10 years.  Varicella vaccine. You may need this if you have not been vaccinated.  Zoster vaccine. You may need this after age 95.  Measles, mumps, and rubella (MMR) vaccine. You may need at least one dose of MMR if you were born in 1957 or later. You may also need a second dose.  Pneumococcal 13-valent conjugate (PCV13) vaccine. You may need this if you have certain conditions and were not previously vaccinated.  Pneumococcal polysaccharide (PPSV23) vaccine. You may need one or two doses if you smoke cigarettes or if you have certain conditions.  Meningococcal vaccine. You may need this if you have certain conditions.  Hepatitis A vaccine. You may need this if you have certain conditions or if you travel or work in places where you may be exposed to hepatitis A.  Hepatitis B vaccine. You may need this if you have certain conditions or if you travel or work in places where you may be exposed to hepatitis B.  Haemophilus influenzae type b (Hib) vaccine. You may need this if you have certain conditions.  Talk to your health care provider about which screenings and vaccines you need and how often you need them. This information is not intended to replace advice given to you by your health care provider. Make sure you discuss any questions you have with your health care provider. Document Released: 11/15/2015 Document Revised: 07/08/2016 Document Reviewed: 08/20/2015 Elsevier Interactive Patient Education  [employer].

## 2018-08-26 NOTE — Progress Notes (Signed)
Subjective:    Patient ID: Amber Roberts, female    DOB: Jun 18, 1961, 57 y.o.   MRN: [id number]  HPI  [name] is a 57 year old female who presents today for complete physical.  Continues with chronic neck pain, acute flare this week with lower neck pain with radiation of pain and numbness/tingling down both extremities. She's taking Ibuprofen for symptoms with temporary improvement.    Immunizations: -Tetanus: Completed in 2013 -Influenza: Completed   Diet: She endorses a fair diet.  Breakfast: Skips Lunch: Fast food restaurant  Dinner: Meat, vegetables, starch, salad Snacks: None Desserts: None Beverages: Soda, beer, water  Exercise: She is not exercising Eye exam: Due this year, has to reschedule. Dental exam: No recent exam Colonoscopy: Never completed, opts for Cologuard Pap Smear: Hysterectomy  Mammogram: Completed in October 2019 Hep C Screen: Due today  BP Readings from Last 3 Encounters:  08/26/18 128/80  04/01/18 124/80  09/29/17 133/84     Review of Systems  Constitutional: Negative for unexpected weight change.  HENT: Negative for rhinorrhea.   Respiratory: Negative for cough and shortness of breath.   Cardiovascular: Negative for chest pain.       Intermittent palpitations   Gastrointestinal: Negative for constipation and diarrhea.  Genitourinary: Negative for difficulty urinating.  Musculoskeletal: Positive for arthralgias and neck pain. Negative for myalgias.  Skin: Negative for rash.  Allergic/Immunologic: Negative for environmental allergies.  Neurological: Positive for numbness. Negative for dizziness and headaches.  Psychiatric/Behavioral: The patient is not nervous/anxious.        Past Medical History:  Diagnosis Date  . Abnormal Pap smear of vagina    abnl paps of vaginal cuff; last pap 11/2003  . Cervical cancer ([place]) early 20's  . Chest pain   . Fibromyalgia   . GERD (gastroesophageal reflux disease)   . Hyperlipidemia   .  Osteopenia   . Premature menopause [age]  . Seasonal allergies    now mostly yearlong  . Seizure ([place]) 2010   per pt, related to lowered seizure threshold from imipramine and tramadol)--saw Dr. Jannifer Franklin  . Smoker      Social History   Socioeconomic History  . Marital status: Single    Spouse name: Not on file  . Number of children: 1  . Years of education: Not on file  . Highest education level: Not on file  Occupational History  . 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 Financial resource strain: Not on file  . Food insecurity:    Worry: Not on file    Inability: Not on file  . Transportation needs:    Medical: Not on file    Non-medical: Not on file  Tobacco Use  . Smoking status: Current Every Day Smoker    Packs/day: 1.00    Years: 10.00    Pack years: 10.00    Types: Cigarettes  . Smokeless tobacco: Never Used  Substance and Sexual Activity  . Alcohol use: Yes    Alcohol/week: 14.0 standard drinks    Types: 14 Cans of beer per week    Comment: 2-3 beers per day.  . Drug use: No  . Sexual activity: Not Currently  Lifestyle  . Physical activity:    Days per week: Not on file    Minutes per session: Not on file  . Stress: Not on file  Relationships  . Social connections:    Talks on phone: Not on file    Gets  together: Not on file    Attends religious service: Not on file    Active member of club or organization: Not on file    Attends meetings of clubs or organizations: Not on file    Relationship status: Not on file  . Intimate partner violence:    Fear of current or ex partner: Not on file    Emotionally abused: Not on file    Physically abused: Not on file    Forced sexual activity: Not on file  Other Topics Concern  . Not on file  Social History Narrative   Lives at home with 44 year old autistic son.  5 cats, 1 dog.      Past Surgical History:  Procedure Laterality Date  . surgery for cervical cancer    . VAGINAL  HYSTERECTOMY     fibroids  . WRIST SURGERY     deQuervains    Family History  Problem Relation Age of Onset  . Scoliosis Mother   . Cancer Father 77       testicular cancer  . Autism Son   . Cancer Maternal Uncle        esophageal (smoker)  . Macular degeneration Maternal Grandmother        wet  . Hypertension Maternal Grandmother   . Hyperlipidemia Maternal Grandmother   . Depression Maternal Grandmother        related to age, loss of vision  . Colitis Maternal Grandmother   . Breast cancer Paternal Grandfather   . Diabetes Neg Hx     Allergies  Allergen Reactions  . Darvon [Propoxyphene Hcl] Nausea And Vomiting    No current outpatient medications on file prior to visit.   No current facility-administered medications on file prior to visit.     BP 128/80   Pulse 84   Temp 98.2 F (36.8 C) (Oral)   Ht 5\' 5"  (1.651 m)   Wt 111 lb 8 oz (50.6 kg)   SpO2 99%   BMI 18.55 kg/m    Objective:   Physical Exam  Constitutional: She is oriented to person, place, and time. She appears well-nourished.  HENT:  Mouth/Throat: No oropharyngeal exudate.  Eyes: Pupils are equal, round, and reactive to light. EOM are normal.  Neck: Neck supple. Decreased range of motion present. No thyromegaly present.  Decrease in ROM due to pain with flexion and extension. Chronic issue.   Cardiovascular: Normal rate and regular rhythm.  Respiratory: Effort normal and breath sounds normal.  GI: Soft. Bowel sounds are normal. There is no tenderness.  Neurological: She is alert and oriented to person, place, and time.  Skin: Skin is warm and dry.  Psychiatric: She has a normal mood and affect.           Assessment & Plan:

## 2018-08-26 NOTE — Assessment & Plan Note (Addendum)
Following with neurosurgery, completed MRI of the cervical spine in August 2019. She's frustrated with the lack of follow up with her neurosurgeon's office. She'd like a new referral. Referral placed.   Rx for prednisone course provided. Also provide 10 tablets of Tramadol to use sparingly until she can be seen by her new neurosurgeon.

## 2018-08-27 LAB — HEPATITIS C ANTIBODY
HEP C AB: NONREACTIVE
SIGNAL TO CUT-OFF: 0.02 (ref <1.00)

## 2018-08-29 ENCOUNTER — Telehealth: Payer: Self-pay | Admitting: *Deleted

## 2018-08-29 NOTE — Telephone Encounter (Signed)
Received referral for low dose lung cancer screening CT scan. Message left at phone number listed in EMR for patient to call me back to facilitate scheduling scan.  

## 2018-08-31 ENCOUNTER — Telehealth: Payer: Self-pay | Admitting: *Deleted

## 2018-08-31 NOTE — Telephone Encounter (Signed)
Received referral for low dose lung cancer screening CT scan. Message left at phone number listed in EMR for patient to call me back to facilitate scheduling scan.  

## 2018-09-01 ENCOUNTER — Telehealth: Payer: Self-pay | Admitting: *Deleted

## 2018-09-01 ENCOUNTER — Encounter: Payer: Self-pay | Admitting: *Deleted

## 2018-09-01 DIAGNOSIS — Z122 Encounter for screening for malignant neoplasm of respiratory organs: Secondary | ICD-10-CM

## 2018-09-01 NOTE — Telephone Encounter (Signed)
Received a referral for initial lung cancer screening scan.  Contacted the patient and obtained their smoking history, current smoker 1 ppd for 41 years 62 pkyr history  as well as answering questions related to screening process.  Patient denies signs of lung cancer such as weight loss or hemoptysis at this time.  Patient denies comorbidity that would prevent curative treatment if lung cancer were found.  Patient is scheduled for the Shared Decision Making Visit and CT scan on 09-23-18@0900 .

## 2018-09-13 ENCOUNTER — Telehealth: Payer: Self-pay

## 2018-09-13 NOTE — Telephone Encounter (Signed)
Left message for patient to call back in regards to a referral   

## 2018-09-23 ENCOUNTER — Inpatient Hospital Stay: Payer: 59 | Attending: Nurse Practitioner | Admitting: Oncology

## 2018-09-23 ENCOUNTER — Ambulatory Visit
Admission: RE | Admit: 2018-09-23 | Discharge: 2018-09-23 | Disposition: A | Discharge status: Home / Self Care | Payer: 59 | Source: Ambulatory Visit | Attending: Nurse Practitioner | Admitting: Nurse Practitioner

## 2018-09-23 DIAGNOSIS — Z122 Encounter for screening for malignant neoplasm of respiratory organs: Secondary | ICD-10-CM | POA: Insufficient documentation

## 2018-09-23 DIAGNOSIS — Z87891 Personal history of nicotine dependence: Secondary | ICD-10-CM | POA: Diagnosis not present

## 2018-09-23 NOTE — Progress Notes (Signed)
In accordance with CMS guidelines, patient has met eligibility criteria including age, absence of signs or symptoms of lung cancer.  Social History   Tobacco Use  . Smoking status: Current Every Day Smoker    Packs/day: 1.00    Years: 41.00    Pack years: 41.00    Types: Cigarettes  . Smokeless tobacco: Never Used  Substance Use Topics  . Alcohol use: Yes    Alcohol/week: 14.0 standard drinks    Types: 14 Cans of beer per week    Comment: 2-3 beers per day.  . Drug use: No     A shared decision-making session was conducted prior to the performance of CT scan. This includes one or more decision aids, includes benefits and harms of screening, follow-up diagnostic testing, over-diagnosis, false positive rate, and total radiation exposure.  Counseling on the importance of adherence to annual lung cancer LDCT screening, impact of co-morbidities, and ability or willingness to undergo diagnosis and treatment is imperative for compliance of the program.  Counseling on the importance of continued smoking cessation for former smokers; the importance of smoking cessation for current smokers, and information about tobacco cessation interventions have been given to patient including [place] and 1800 quit [place] programs.  Written order for lung cancer screening with LDCT has been given to the patient and any and all questions have been answered to the best of my abilities.   Yearly follow up will be coordinated by Burgess Estelle, Thoracic Navigator.  Faythe Casa, NP 09/23/2018 9:47 AM

## 2018-09-26 ENCOUNTER — Encounter: Payer: Self-pay | Admitting: *Deleted

## 2019-03-10 DIAGNOSIS — H9209 Otalgia, unspecified ear: Secondary | ICD-10-CM

## 2019-03-10 MED ORDER — IBUPROFEN 800 MG PO TABS: 800.0000 mg | ORAL_TABLET | Freq: Three times a day (TID) | ORAL | 0 refills | Status: DC | PRN

## 2019-04-18 ENCOUNTER — Telehealth: Payer: Self-pay | Admitting: Primary Care

## 2019-04-18 NOTE — Telephone Encounter (Signed)
Pt reports body aches, LGT 99.3, nausea, sore throat. Onset today. States was at beach this past weekend. 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 Requesting covid testing. Assured pt [place] would route to practice for PCP's review, referral. Pt verbalizes understanding. 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   Please advise: CB# [phone]

## 2019-04-19 ENCOUNTER — Encounter: Payer: Self-pay | Admitting: Family Medicine

## 2019-04-19 ENCOUNTER — Telehealth: Payer: Self-pay

## 2019-04-19 ENCOUNTER — Other Ambulatory Visit: Payer: 59

## 2019-04-19 ENCOUNTER — Ambulatory Visit (INDEPENDENT_AMBULATORY_CARE_PROVIDER_SITE_OTHER): Payer: 59 | Admitting: Family Medicine

## 2019-04-19 DIAGNOSIS — Z20822 Contact with and (suspected) exposure to covid-19: Secondary | ICD-10-CM

## 2019-04-19 DIAGNOSIS — B349 Viral infection, unspecified: Secondary | ICD-10-CM

## 2019-04-19 NOTE — Assessment & Plan Note (Signed)
With n/v (improved now) low grade temperature and mild dry cough as well as fatigue/malaise Disc symptomatic care and isolation (she is doing) Acetaminophen/fluids/rest Info entered in [facility] pool to order covid screening-told she will expect a call  inst to update if symptoms worsen or if sob  If suddenly worse-recommend ED eval Pt voiced understanding

## 2019-04-19 NOTE — Telephone Encounter (Addendum)
I spoke with pt; pt having fever,S/T,prod cough with clear phlegm,muscle pain,H/A, no travel outside the state but pt was at the beach this past weekend. No known exposure to covid or flu. No chills,SOB,diarrhea and no loss of taste or smell. 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 Pt request covid testing. Pt has virtual appt with Dr Glori Bickers today at [facility].

## 2019-04-19 NOTE — Progress Notes (Signed)
Virtual Visit via Video Note  I connected with Amber Roberts on 04/19/19 at 10:00 AM EDT by a video enabled telemedicine application and verified that I am speaking with the correct person using two identifiers.  Location: Patient: home Provider: office    I discussed the limitations of evaluation and management by telemedicine and the availability of in person appointments. The patient expressed understanding and agreed to proceed.  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 Chronic neck pain 04/01/2018  . Pain in both lower extremities 04/01/2018  . Palpitations 09/29/2017  . Osteoarthritis 08/13/2017  . Varicose veins of lower extremity 08/13/2017  . Preventative health care 08/13/2017  . Abnormal EKG 08/12/2017  . Osteopenia 09/14/2012  . Metatarsal fracture 09/14/2012  . Tobacco use disorder 09/14/2012  . GERD (gastroesophageal reflux disease) 09/14/2012  . CAD (coronary artery disease) 09/14/2012  . Pure hypercholesterolemia 09/14/2012   Past Medical History:  Diagnosis Date  . Abnormal Pap smear of vagina    abnl paps of vaginal cuff; last pap 11/2003  . Cervical cancer ([place]) early 20's  . Chest pain   . Fibromyalgia   . GERD (gastroesophageal reflux disease)   . Hyperlipidemia   . Osteopenia   . Premature menopause [age]  .  Seasonal allergies    now mostly yearlong  . Seizure ([place]) 2010   per pt, related to lowered seizure threshold from imipramine and tramadol)--saw Dr. Jannifer Franklin  . Smoker    Past Surgical History:  Procedure Laterality Date  . surgery for cervical cancer    . VAGINAL HYSTERECTOMY     fibroids  . WRIST SURGERY     deQuervains   Social History   Tobacco Use  . Smoking status: Current Every Day Smoker    Packs/day: 1.00    Years: 41.00    Pack years: 41.00    Types: Cigarettes  . Smokeless tobacco: Never Used  Substance Use Topics  . Alcohol use: Yes    Alcohol/week: 14.0 standard drinks    Types: 14 Cans of beer per week    Comment: 2-3 beers per day.  . Drug use: No   Family History  Problem Relation Age of Onset  . Scoliosis Mother   . Cancer Father 46       testicular cancer  . Autism Son   . Cancer Maternal Uncle        esophageal (smoker)  . Macular degeneration Maternal Grandmother        wet  . Hypertension Maternal Grandmother   . Hyperlipidemia Maternal Grandmother   . Depression Maternal Grandmother        related to age, loss of vision  . Colitis Maternal  Grandmother   . Breast cancer Paternal Grandfather   . Diabetes Neg Hx    Allergies  Allergen Reactions  . Darvon [Propoxyphene Hcl] Nausea And Vomiting   Current Outpatient Medications on File Prior to Visit  Medication Sig Dispense Refill  . ibuprofen (ADVIL) 800 MG tablet Take 1 tablet (800 mg total) by mouth every 8 (eight) hours as needed for moderate pain. 15 tablet 0   No current facility-administered medications on file prior to visit.    Review of Systems  Constitutional: Positive for fever and malaise/fatigue. Negative for chills, diaphoresis and weight loss.  HENT: Positive for sore throat. Negative for congestion and ear pain.   Eyes: Negative for discharge and redness.  Respiratory: Positive for cough. Negative for sputum production, shortness of breath and wheezing.   Cardiovascular:  Negative for chest pain and palpitations.  Gastrointestinal: Positive for nausea and vomiting. Negative for diarrhea.  Musculoskeletal: Negative for myalgias.  Skin: Negative for itching and rash.  Neurological: Negative for dizziness and headaches.      Observations/Objective: Patient appears fatiguedl, in no distress Weight is baseline  No facial swelling or asymmetry Normal voice-not hoarse and no slurred speech No obvious tremor or mobility impairment Moving neck and UEs normally Able to hear the call well  No cough or shortness of breath during interview, she does clear her throat  Talkative and mentally sharp with no cognitive changes No skin changes on face or neck , no rash or pallor Affect is normal    Assessment and Plan: Problem List Items Addressed This Visit      Other   Viral syndrome    With n/v (improved now) low grade temperature and mild dry cough as well as fatigue/malaise Disc symptomatic care and isolation (she is doing) Acetaminophen/fluids/rest Info entered in [facility] pool to order covid screening-told she will expect a call  inst to update if symptoms worsen or if sob  If suddenly worse-recommend ED eval Pt voiced understanding          Follow Up Instructions:    I discussed the assessment and treatment plan with the patient. The patient was provided an opportunity to ask questions and all were answered. The patient agreed with the plan and demonstrated an understanding of the instructions.   The patient was advised to call back or seek an in-person evaluation if the symptoms worsen or if the condition fails to improve as anticipated.     Loura Pardon, MD

## 2019-04-19 NOTE — Telephone Encounter (Addendum)
Patient called and advised of the request for covid testing, she verbalized understanding. Appointment scheduled for today at 63 at [facility], advised of location and to wear a mask for everyone in the vehicle, she verbalized understanding. Order placed.   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

## 2019-04-19 NOTE — Telephone Encounter (Signed)
I will see her then  

## 2019-04-21 ENCOUNTER — Encounter: Payer: Self-pay | Admitting: Family Medicine

## 2019-04-21 NOTE — Telephone Encounter (Signed)
Dr. Glori Bickers is out of the office.  Will forward to her PCP.

## 2019-04-22 LAB — NOVEL CORONAVIRUS, NAA: SARS-CoV-2, NAA: NOT DETECTED

## 2019-07-25 ENCOUNTER — Other Ambulatory Visit: Payer: Self-pay | Admitting: Primary Care

## 2019-07-25 DIAGNOSIS — Z1231 Encounter for screening mammogram for malignant neoplasm of breast: Secondary | ICD-10-CM

## 2019-09-01 ENCOUNTER — Encounter: Payer: 59 | Admitting: Primary Care

## 2019-09-08 ENCOUNTER — Other Ambulatory Visit: Payer: Self-pay

## 2019-09-08 ENCOUNTER — Telehealth: Payer: Self-pay

## 2019-09-08 DIAGNOSIS — Z20822 Contact with and (suspected) exposure to covid-19: Secondary | ICD-10-CM

## 2019-09-08 NOTE — Telephone Encounter (Signed)
Noted  

## 2019-09-08 NOTE — Telephone Encounter (Signed)
Patient called stating that she wanted to get tested for COVID and her manager wants her to also. Symptoms started on 09/06/2019-nasal congestion, pressure, hoarse, some body aches. Today feels more run down, mild cough present, still achy. Hopes its just her allergies. Patient will go to [facility] testing site but wants to make sure they using  RSP test (suppose to be a better test) and she will call the testing sight to find out FYI to PCP

## 2019-09-09 LAB — NOVEL CORONAVIRUS, NAA: SARS-CoV-2, NAA: NOT DETECTED

## 2019-09-20 ENCOUNTER — Telehealth: Payer: Self-pay | Admitting: *Deleted

## 2019-09-20 NOTE — Telephone Encounter (Signed)
Left Amber Roberts a message to notify her that it is time to schedule annual low dose lung cancer screening CT scan. Instructed patient to call back to verify information prior to the scan being scheduled.

## 2019-09-25 ENCOUNTER — Encounter: Payer: Self-pay | Admitting: *Deleted

## 2019-09-25 ENCOUNTER — Telehealth: Payer: Self-pay | Admitting: *Deleted

## 2019-09-25 NOTE — Telephone Encounter (Signed)
Left message for patient to notify them that it is time to schedule annual low dose lung cancer screening CT scan. Instructed patient to call back to verify information prior to the scan being scheduled.  

## 2019-10-20 ENCOUNTER — Telehealth: Payer: Self-pay

## 2019-10-20 ENCOUNTER — Encounter: Payer: Self-pay | Admitting: *Deleted

## 2019-10-20 DIAGNOSIS — H9209 Otalgia, unspecified ear: Secondary | ICD-10-CM

## 2019-10-20 MED ORDER — IBUPROFEN 800 MG PO TABS: 800.0000 mg | ORAL_TABLET | Freq: Three times a day (TID) | ORAL | 0 refills | Status: DC | PRN

## 2019-10-20 NOTE — Telephone Encounter (Signed)
Called patient to schedule. She states she is unable to do a virtual due to having back to back patients the rest of the afternoon. She was just wondering if you would be able to send anything in since the OTC isn't helping the pain. She states she would be able to do a virtual visit on Wednesday.

## 2019-10-20 NOTE — Telephone Encounter (Signed)
Pt said she is having pain in upper back and pt has seen neurosurgeon but pt does not think she is to the point of having surgery and pt has asked the neurosurgeon for prednisone or ibuprofen 800 mg to help with the pain but neurosurgeon will not give anything to pt for pain. 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 Pt said Oct CPX this yr was cancelled by our office; pt last seen annual 08/26/18. Pt said the ibuprofen 800 mg does help the back pain. Ammie Ferrier.Please advise.

## 2019-10-20 NOTE — Telephone Encounter (Signed)
Can we get her added on today at 3:40? Virtually?

## 2019-10-20 NOTE — Telephone Encounter (Signed)
Noted, I'll send in the Ibuprofen.  Have her schedule with me for Wednesday so we can talk.

## 2019-10-24 ENCOUNTER — Encounter: Payer: Self-pay | Admitting: *Deleted

## 2019-10-25 ENCOUNTER — Other Ambulatory Visit: Payer: Self-pay

## 2019-10-25 ENCOUNTER — Telehealth (INDEPENDENT_AMBULATORY_CARE_PROVIDER_SITE_OTHER): Payer: 59 | Admitting: Primary Care

## 2019-10-25 ENCOUNTER — Encounter: Payer: Self-pay | Admitting: Primary Care

## 2019-10-25 DIAGNOSIS — G8929 Other chronic pain: Secondary | ICD-10-CM

## 2019-10-25 DIAGNOSIS — M546 Pain in thoracic spine: Secondary | ICD-10-CM

## 2019-10-25 MED ORDER — PREDNISONE 20 MG PO TABS: ORAL_TABLET | ORAL | 0 refills | Status: DC

## 2019-10-25 NOTE — Assessment & Plan Note (Signed)
Acute on chronic flare, very little relief with NSAID's. Given success with prior prednisone courses we will proceed with prednisone taper for treatment. I did encourage her to touch base with neurosurgery. Rx sent to pharmacy. Avoid Ibuprofen with taking prednisone. She will update. No alarm signs.

## 2019-10-25 NOTE — Progress Notes (Signed)
Subjective:    Patient ID: Amber Roberts, female    DOB: [birth year], 58 y.o.   MRN: [id number]  HPI  Virtual Visit via Video Note  I connected with Amber Roberts on 10/25/19 at  7:40 AM EST by a video enabled telemedicine application and verified that I am speaking with the correct person using two identifiers.  Location: Patient: Home Provider: Office   I discussed the limitations of evaluation and management by telemedicine and the availability of in person appointments. The patient expressed understanding and agreed to proceed.  History of Present Illness:  Amber Roberts is a 58 year old female with a history of CAD, GERD, Osteoarthritis, hyperlipidemia, chronic neck pain who presents today with a chief complaint of back pain.  She called our office last week requesting treatment for upper back pain, we sent in Ibuprofen 800 mg. Today she endorses increased upper left back pain with radiation up through the scapula that has began about two weeks ago. Over the last 4-5 days she's noticed left upper extremity weakness with numbness. She's been taking Ibuprofen numerous times daily with temporary improvement.   Today she's requesting a prescription for prednisone as it "wiped out the pain completely". She denies recent injury/trauma, changes in bowel/bladder control. She does follow with neurosurgery for whom she's not been able to get in touch with. She is trying to avoid surgery as long as possible. No flares since last year.    Observations/Objective:  Alert and oriented. Appears well, not sickly. No distress. Speaking in complete sentences.   Assessment and Plan:  Acute on chronic flare, very little relief with NSAID's. Given success with prior prednisone courses we will proceed with prednisone taper for treatment. I did encourage her to touch base with neurosurgery. Rx sent to pharmacy. Avoid Ibuprofen with taking prednisone. She will update. No alarm  signs.  Follow Up Instructions:  Start prednisone. Take 3 tablets for 3 days, then 2 tablets for 3 days, then 1 tablet for 3 days.   Avoid use of Ibuprofen while taking Prednisone, okay to use Tylenol.  Touch base with neurosurgery as discussed.  It was a pleasure to see you today! Amber Bossier, NP-C    I discussed the assessment and treatment plan with the patient. The patient was provided an opportunity to ask questions and all were answered. The patient agreed with the plan and demonstrated an understanding of the instructions.   The patient was advised to call back or seek an in-person evaluation if the symptoms worsen or if the condition fails to improve as anticipated.    [name], NP    Review of Systems  Musculoskeletal: Positive for back pain.  Neurological: Positive for weakness and numbness.       Past Medical History:  Diagnosis Date  . Abnormal Pap smear of vagina    abnl paps of vaginal cuff; last pap 11/2003  . Cervical cancer ([place]) early 20's  . Chest pain   . Fibromyalgia   . GERD (gastroesophageal reflux disease)   . Hyperlipidemia   . Osteopenia   . Premature menopause 37-38  . Seasonal allergies    now mostly yearlong  . Seizure ([place]) 2010   per pt, related to lowered seizure threshold from imipramine and tramadol)--saw Dr. Jannifer Franklin  . Smoker      Social History   Socioeconomic History  . Marital status: Single    Spouse name: Not on file  . Number of children: 1  . Years  of education: Not on file  . Highest education level: Not on file  Occupational History  . 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 Smoking status: Current Every Day Smoker    Packs/day: 1.00    Years: 41.00    Pack years: 41.00    Types: Cigarettes  . Smokeless tobacco: Never Used  Substance and Sexual Activity  . Alcohol use: Yes    Alcohol/week: 14.0 standard drinks    Types: 14 Cans of beer per week    Comment: 2-3 beers per day.   . Drug use: No  . Sexual activity: Not Currently  Other Topics Concern  . Not on file  Social History Narrative   Lives at home with 12 year old autistic son.  5 cats, 1 dog.     Social Determinants of Health   Financial Resource Strain:   . Difficulty of Paying Living Expenses: Not on file  Food Insecurity:   . Worried About Charity fundraiser in the Last Year: Not on file  . Ran Out of Food in the Last Year: Not on file  Transportation Needs:   . Lack of Transportation (Medical): Not on file  . Lack of Transportation (Non-Medical): Not on file  Physical Activity:   . Days of Exercise per Week: Not on file  . Minutes of Exercise per Session: Not on file  Stress:   . Feeling of Stress : Not on file  Social Connections:   . Frequency of Communication with Friends and Family: Not on file  . Frequency of Social Gatherings with Friends and Family: Not on file  . Attends Religious Services: Not on file  . Active Member of Clubs or Organizations: Not on file  . Attends Archivist Meetings: Not on file  . Marital Status: Not on file  Intimate Partner Violence:   . Fear of Current or Ex-Partner: Not on file  . Emotionally Abused: Not on file  . Physically Abused: Not on file  . Sexually Abused: Not on file    Past Surgical History:  Procedure Laterality Date  . surgery for cervical cancer    . VAGINAL HYSTERECTOMY     fibroids  . WRIST SURGERY     deQuervains    Family History  Problem Relation Age of Onset  . Scoliosis Mother   . Cancer Father 80       testicular cancer  . Autism Son   . Cancer Maternal Uncle        esophageal (smoker)  . Macular degeneration Maternal Grandmother        wet  . Hypertension Maternal Grandmother   . Hyperlipidemia Maternal Grandmother   . Depression Maternal Grandmother        related to age, loss of vision  . Colitis Maternal Grandmother   . Breast cancer Paternal Grandfather   . Diabetes Neg Hx     Allergies   Allergen Reactions  . Darvon [Propoxyphene Hcl] Nausea And Vomiting    Current Outpatient Medications on File Prior to Visit  Medication Sig Dispense Refill  . ibuprofen (ADVIL) 800 MG tablet Take 1 tablet (800 mg total) by mouth every 8 (eight) hours as needed for moderate pain. 30 tablet 0   No current facility-administered medications on file prior to visit.    Pulse 88    Objective:   Physical Exam  Constitutional: She is oriented to person, place, and time. She appears well-nourished.  Respiratory:  Effort normal.  Neurological: She is alert and oriented to person, place, and time.  Psychiatric: She has a normal mood and affect.           Assessment & Plan:

## 2019-10-25 NOTE — Patient Instructions (Signed)
Start prednisone. Take 3 tablets for 3 days, then 2 tablets for 3 days, then 1 tablet for 3 days.   Avoid use of Ibuprofen while taking Prednisone, okay to use Tylenol.  Touch base with neurosurgery as discussed.  It was a pleasure to see you today! [name], NP-C

## 2019-11-07 ENCOUNTER — Encounter: Payer: Self-pay | Admitting: *Deleted

## 2019-11-24 ENCOUNTER — Telehealth (INDEPENDENT_AMBULATORY_CARE_PROVIDER_SITE_OTHER): Payer: 59 | Admitting: Primary Care

## 2019-11-24 ENCOUNTER — Encounter: Payer: Self-pay | Admitting: Primary Care

## 2019-11-24 VITALS — Temp 98.0°F | Wt 108.0 lb

## 2019-11-24 DIAGNOSIS — K0889 Other specified disorders of teeth and supporting structures: Secondary | ICD-10-CM

## 2019-11-24 MED ORDER — AMOXICILLIN 875 MG PO TABS: 875.0000 mg | ORAL_TABLET | Freq: Two times a day (BID) | ORAL | 0 refills | Status: DC

## 2019-11-24 NOTE — Progress Notes (Signed)
Subjective:    Patient ID: Amber Roberts, female    DOB: [birth year], 59 y.o.   MRN: [id number]  HPI  Virtual Visit via Video Note  I connected with [name] on 11/24/19 at 12:00 PM EST by a video enabled telemedicine application and verified that I am speaking with the correct person using two identifiers.  Location: Patient: Musician Provider: Office   I discussed the limitations of evaluation and management by telemedicine and the availability of in person appointments. The patient expressed understanding and agreed to proceed.  History of Present Illness:  Amber Roberts is a 59 year old female with a history of CAD, GERD, hyperlipidemia, chronic neck pain who presents today with a chief complaint of dental pain.  She is currently undergoing a lot of dental work including pulled teeth for cracked fillings, bone grafts, repair of mercury fillings. She is due next month to have her left lower molar repaired. Yesterday she developed a left sided molar pain to the area that will be removed. She took Ibuprofen last night without improvement. This morning she woke up with significant pain and swelling to her left jaw and left submandibular neck. She denies fevers, continues to notice swelling and discomfort to her jaw.   She called her dentists office today but they are closed. She plans on calling in the morning Monday next week.    Observations/Objective:  Alert and oriented. Appears well, not sickly. No distress. Speaking in complete sentences. Slight swelling noted to left lateral neck, appears to be lymph node. Normal ROM of jaw.   Assessment and Plan:  Acute dental pain in the setting of ongoing dental work. Now with lymphadenopathy and no relief with OTC treatment. Rx for Amoxil course provided to prevent/treat potential infection. Follow up with dentist.   Follow Up Instructions:  Start amoxicillin antibiotics for the infection. Take 1 tablet by mouth twice  daily for 7 days.  Contact your dentist as discussed.  It was a pleasure to see you today! Amber Bossier, NP-C    I discussed the assessment and treatment plan with the patient. The patient was provided an opportunity to ask questions and all were answered. The patient agreed with the plan and demonstrated an understanding of the instructions.   The patient was advised to call back or seek an in-person evaluation if the symptoms worsen or if the condition fails to improve as anticipated.     [name], NP    Review of Systems  Constitutional: Negative for fever.  HENT:       Dental pain, lymph node swelling, see HPI       Past Medical History:  Diagnosis Date  . Abnormal Pap smear of vagina    abnl paps of vaginal cuff; last pap 11/2003  . Cervical cancer ([place]) early 20's  . Chest pain   . Fibromyalgia   . GERD (gastroesophageal reflux disease)   . Hyperlipidemia   . Osteopenia   . Premature menopause [age]  . Seasonal allergies    now mostly yearlong  . Seizure ([place]) 2010   per pt, related to lowered seizure threshold from imipramine and tramadol)--saw Dr. Jannifer Franklin  . Smoker      Social History   Socioeconomic History  . Marital status: Single    Spouse name: Not on file  . Number of children: 1  . Years of education: Not on file  . Highest education level: Not on file  Occupational History  . 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 Smoking status: Current Every Day Smoker    Packs/day: 1.00    Years: 41.00    Pack years: 41.00    Types: Cigarettes  . Smokeless tobacco: Never Used  Substance and Sexual Activity  . Alcohol use: Yes    Alcohol/week: 14.0 standard drinks    Types: 14 Cans of beer per week    Comment: 2-3 beers per day.  . Drug use: No  . Sexual activity: Not Currently  Other Topics Concern  . Not on file  Social History Narrative   Lives at home with 84 year old autistic son.  5 cats, 1 dog.     Social  Determinants of Health   Financial Resource Strain:   . Difficulty of Paying Living Expenses: Not on file  Food Insecurity:   . Worried About Charity fundraiser in the Last Year: Not on file  . Ran Out of Food in the Last Year: Not on file  Transportation Needs:   . Lack of Transportation (Medical): Not on file  . Lack of Transportation (Non-Medical): Not on file  Physical Activity:   . Days of Exercise per Week: Not on file  . Minutes of Exercise per Session: Not on file  Stress:   . Feeling of Stress : Not on file  Social Connections:   . Frequency of Communication with Friends and Family: Not on file  . Frequency of Social Gatherings with Friends and Family: Not on file  . Attends Religious Services: Not on file  . Active Member of Clubs or Organizations: Not on file  . Attends Archivist Meetings: Not on file  . Marital Status: Not on file  Intimate Partner Violence:   . Fear of Current or Ex-Partner: Not on file  . Emotionally Abused: Not on file  . Physically Abused: Not on file  . Sexually Abused: Not on file    Past Surgical History:  Procedure Laterality Date  . surgery for cervical cancer    . VAGINAL HYSTERECTOMY     fibroids  . WRIST SURGERY     deQuervains    Family History  Problem Relation Age of Onset  . Scoliosis Mother   . Cancer Father 40       testicular cancer  . Autism Son   . Cancer Maternal Uncle        esophageal (smoker)  . Macular degeneration Maternal Grandmother        wet  . Hypertension Maternal Grandmother   . Hyperlipidemia Maternal Grandmother   . Depression Maternal Grandmother        related to age, loss of vision  . Colitis Maternal Grandmother   . Breast cancer Paternal Grandfather   . Diabetes Neg Hx     Allergies  Allergen Reactions  . Darvon [Propoxyphene Hcl] Nausea And Vomiting    Current Outpatient Medications on File Prior to Visit  Medication Sig Dispense Refill  . ibuprofen (ADVIL) 800 MG tablet  Take 1 tablet (800 mg total) by mouth every 8 (eight) hours as needed for moderate pain. 30 tablet 0   No current facility-administered medications on file prior to visit.    Temp 98 F (36.7 C) (Temporal)   Wt 108 lb (49 kg)   BMI 17.97 kg/m    Objective:   Physical Exam  Constitutional: She is oriented to person, place, and time. She appears well-nourished.  Neck:  Slight swelling noted to left lateral  neck, appears to be lymph node. Normal ROM of jaw.    Neurological: She is alert and oriented to person, place, and time.  Psychiatric: She has a normal mood and affect.           Assessment & Plan:

## 2019-11-24 NOTE — Telephone Encounter (Signed)
Patient needs an appointment. Can be virtual. [facility] has openings. Thanks!

## 2019-11-24 NOTE — Patient Instructions (Signed)
Start amoxicillin antibiotics for the infection. Take 1 tablet by mouth twice daily for 7 days.  Contact your dentist as discussed.  It was a pleasure to see you today! [name], NP-C

## 2020-01-05 ENCOUNTER — Ambulatory Visit
Admission: RE | Admit: 2020-01-05 | Discharge: 2020-01-05 | Disposition: A | Discharge status: Home / Self Care | Payer: 59 | Source: Ambulatory Visit | Attending: Primary Care | Admitting: Primary Care

## 2020-01-05 DIAGNOSIS — Z1231 Encounter for screening mammogram for malignant neoplasm of breast: Secondary | ICD-10-CM | POA: Insufficient documentation

## 2020-03-29 ENCOUNTER — Encounter: Payer: 59 | Admitting: Primary Care

## 2020-04-18 NOTE — Telephone Encounter (Signed)
Pt had video visit in 10/2019.

## 2020-05-31 ENCOUNTER — Encounter: Payer: Self-pay | Admitting: Primary Care

## 2020-05-31 ENCOUNTER — Other Ambulatory Visit: Payer: Self-pay

## 2020-05-31 ENCOUNTER — Ambulatory Visit: Payer: 59 | Admitting: Primary Care

## 2020-05-31 VITALS — BP 134/82 | HR 93 | Temp 96.2°F | Ht 65.0 in | Wt 105.8 lb

## 2020-05-31 DIAGNOSIS — Z114 Encounter for screening for human immunodeficiency virus [HIV]: Secondary | ICD-10-CM | POA: Diagnosis not present

## 2020-05-31 DIAGNOSIS — R5383 Other fatigue: Secondary | ICD-10-CM | POA: Diagnosis not present

## 2020-05-31 DIAGNOSIS — R630 Anorexia: Secondary | ICD-10-CM | POA: Insufficient documentation

## 2020-05-31 DIAGNOSIS — E7800 Pure hypercholesterolemia, unspecified: Secondary | ICD-10-CM | POA: Diagnosis not present

## 2020-05-31 DIAGNOSIS — I700 Atherosclerosis of aorta: Secondary | ICD-10-CM | POA: Insufficient documentation

## 2020-05-31 DIAGNOSIS — F17200 Nicotine dependence, unspecified, uncomplicated: Secondary | ICD-10-CM

## 2020-05-31 DIAGNOSIS — K219 Gastro-esophageal reflux disease without esophagitis: Secondary | ICD-10-CM

## 2020-05-31 DIAGNOSIS — G479 Sleep disorder, unspecified: Secondary | ICD-10-CM

## 2020-05-31 LAB — POC URINALSYSI DIPSTICK (AUTOMATED)
Bilirubin, UA: NEGATIVE
Blood, UA: NEGATIVE
Glucose, UA: NEGATIVE
Ketones, UA: NEGATIVE
Leukocytes, UA: NEGATIVE
Nitrite, UA: NEGATIVE
Protein, UA: NEGATIVE
Spec Grav, UA: <=1.005 — AB (ref 1.010–1.025)
Urobilinogen, UA: 0.2 E.U./dL
pH, UA: 6 (ref 5.0–8.0)

## 2020-05-31 LAB — COMPREHENSIVE METABOLIC PANEL
ALT: 28 U/L (ref 0–35)
AST: 56 U/L — ABNORMAL HIGH (ref 0–37)
Albumin: 4.2 g/dL (ref 3.5–5.2)
Alkaline Phosphatase: 103 U/L (ref 39–117)
BUN: 4 mg/dL — ABNORMAL LOW (ref 6–23)
CO2: 27 mEq/L (ref 19–32)
Calcium: 9.1 mg/dL (ref 8.4–10.5)
Chloride: 95 mEq/L — ABNORMAL LOW (ref 96–112)
Creatinine, Ser: 0.64 mg/dL (ref 0.40–1.20)
GFR: 95.49 mL/min (ref >60.00)
Glucose, Bld: 85 mg/dL (ref 70–99)
Potassium: 3.6 mEq/L (ref 3.5–5.1)
Sodium: 130 mEq/L — ABNORMAL LOW (ref 135–145)
Total Bilirubin: 0.5 mg/dL (ref 0.2–1.2)
Total Protein: 6.6 g/dL (ref 6.0–8.3)

## 2020-05-31 LAB — LIPID PANEL
Cholesterol: 183 mg/dL (ref 0–200)
HDL: 85 mg/dL (ref >39.00)
LDL Cholesterol: 64 mg/dL (ref 0–99)
NonHDL: 97.86
Total CHOL/HDL Ratio: 2
Triglycerides: 171 mg/dL — ABNORMAL HIGH (ref 0.0–149.0)
VLDL: 34.2 mg/dL (ref 0.0–40.0)

## 2020-05-31 LAB — CBC
HCT: 37.2 % (ref 36.0–46.0)
Hemoglobin: 12.7 g/dL (ref 12.0–15.0)
MCHC: 34.2 g/dL (ref 30.0–36.0)
MCV: 106.2 fl — ABNORMAL HIGH (ref 78.0–100.0)
Platelets: 133 10*3/uL — ABNORMAL LOW (ref 150.0–400.0)
RBC: 3.5 Mil/uL — ABNORMAL LOW (ref 3.87–5.11)
RDW: 15.5 % (ref 11.5–15.5)
WBC: 5.6 10*3/uL (ref 4.0–10.5)

## 2020-05-31 LAB — VITAMIN D 25 HYDROXY (VIT D DEFICIENCY, FRACTURES): VITD: 9.94 ng/mL — ABNORMAL LOW (ref 30.00–100.00)

## 2020-05-31 LAB — TSH: TSH: 1.17 u[IU]/mL (ref 0.35–4.50)

## 2020-05-31 LAB — VITAMIN B12: Vitamin B-12: 340 pg/mL (ref 211–911)

## 2020-05-31 MED ORDER — MIRTAZAPINE 15 MG PO TABS: 7.5000 mg | ORAL_TABLET | Freq: Every day | ORAL | 0 refills | Status: DC

## 2020-05-31 NOTE — Progress Notes (Signed)
Subjective:    Patient ID: Amber Roberts, female    DOB: [birth year], 59 y.o.   MRN: [id number]  HPI  This visit occurred during the SARS-CoV-2 public health emergency.  Safety protocols were in place, including screening questions prior to the visit, additional usage of staff PPE, and extensive cleaning of exam room while observing appropriate contact time as indicated for disinfecting solutions.   Ms. Delano is a 59 year old female with a history of CAD, GERD, chronic neck pain, chronic back pain, hyperlipidemia who presents today with a chief complaint of fatigue.  She's been under a lot of stress in her personal life over the last several months, this has improved over the last four weeks.  She feels fatigued, little appetite, feeling nauseated, little energy, has to take naps after work. She's also noticed an odd color in her urine and is worried about this given her tobacco abuse history. Symptoms began a few months ago, but worse over the last 6 weeks.   She donated blood in March 2021, was told that her hemoglobin was normal. She is down to smoking 6 cigarettes daily for the last month, has been smoking intermittent since the age of 89.  She underwent lung cancer screening in November 2019 which was negative for nodules but showed aortic atherosclerosis. No recent lipid panel on file. She was once on statin therapy, has not taken in years.   She's really not eaten much over the last 6 weeks due to fatigue, also with difficulty sleeping. She is requesting medication to help increase appetite.   She denies depression, anxiety, snoring, bloody stools, hematuria.   Wt Readings from Last 3 Encounters:  05/31/20 105 lb 12 oz (48 kg)  11/24/19 108 lb (49 kg)  09/23/18 106 lb (48.1 kg)     Review of Systems  Constitutional: Positive for appetite change and fatigue.  Gastrointestinal: Negative for blood in stool.  Genitourinary: Negative for hematuria and vaginal bleeding.    Psychiatric/Behavioral: Positive for sleep disturbance. The patient is not nervous/anxious.        Past Medical History:  Diagnosis Date  . Abnormal Pap smear of vagina    abnl paps of vaginal cuff; last pap 11/2003  . Cervical cancer ([place]) early 20's  . Chest pain   . Fibromyalgia   . GERD (gastroesophageal reflux disease)   . Hyperlipidemia   . Osteopenia   . Premature menopause 37-38  . Seasonal allergies    now mostly yearlong  . Seizure ([place]) 2010   per pt, related to lowered seizure threshold from imipramine and tramadol)--saw Dr. Jannifer Franklin  . Smoker      Social History   Socioeconomic History  . Marital status: Single    Spouse name: Not on file  . Number of children: 1  . Years of education: Not on file  . Highest education level: Not on file  Occupational History  . 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 Smoking status: Current Every Day Smoker    Packs/day: 1.00    Years: 41.00    Pack years: 41.00    Types: Cigarettes  . Smokeless tobacco: Never Used  Vaping Use  . Vaping Use: Never used  Substance and Sexual Activity  . Alcohol use: Yes    Alcohol/week: 14.0 standard drinks    Types: 14 Cans of beer per week    Comment: 2-3 beers per day.  . Drug use: No  . Sexual  activity: Not Currently  Other Topics Concern  . Not on file  Social History Narrative   Lives at home with 16 year old autistic son.  5 cats, 1 dog.     Social Determinants of Health   Financial Resource Strain:   . Difficulty of Paying Living Expenses:   Food Insecurity:   . Worried About Charity fundraiser in the Last Year:   . Arboriculturist in the Last Year:   Transportation Needs:   . Film/video editor (Medical):   Marland Kitchen Lack of Transportation (Non-Medical):   Physical Activity:   . Days of Exercise per Week:   . Minutes of Exercise per Session:   Stress:   . Feeling of Stress :   Social Connections:   . Frequency of Communication with Friends  and Family:   . Frequency of Social Gatherings with Friends and Family:   . Attends Religious Services:   . Active Member of Clubs or Organizations:   . Attends Archivist Meetings:   Marland Kitchen Marital Status:   Intimate Partner Violence:   . Fear of Current or Ex-Partner:   . Emotionally Abused:   Marland Kitchen Physically Abused:   . Sexually Abused:     Past Surgical History:  Procedure Laterality Date  . surgery for cervical cancer    . VAGINAL HYSTERECTOMY     fibroids  . WRIST SURGERY     deQuervains    Family History  Problem Relation Age of Onset  . Scoliosis Mother   . Cancer Father 78       testicular cancer  . Autism Son   . Cancer Maternal Uncle        esophageal (smoker)  . Macular degeneration Maternal Grandmother        wet  . Hypertension Maternal Grandmother   . Hyperlipidemia Maternal Grandmother   . Depression Maternal Grandmother        related to age, loss of vision  . Colitis Maternal Grandmother   . Breast cancer Maternal Grandmother   . Breast cancer Paternal Grandfather   . Diabetes Neg Hx     Allergies  Allergen Reactions  . Darvon [Propoxyphene Hcl] Nausea And Vomiting    Current Outpatient Medications on File Prior to Visit  Medication Sig Dispense Refill  . lansoprazole (PREVACID) 15 MG capsule      No current facility-administered medications on file prior to visit.    BP (!) 134/82   Pulse 93   Temp (!) 96.2 F (35.7 C) (Temporal)   Ht 5\' 5"  (1.651 m)   Wt 105 lb 12 oz (48 kg)   SpO2 97%   BMI 17.60 kg/m    Objective:   Physical Exam Cardiovascular:     Rate and Rhythm: Normal rate and regular rhythm.  Pulmonary:     Effort: Pulmonary effort is normal.     Breath sounds: Normal breath sounds.  Musculoskeletal:     Cervical back: Neck supple.  Skin:    General: Skin is warm and dry.  Psychiatric:        Mood and Affect: Mood normal.            Assessment & Plan:

## 2020-05-31 NOTE — Assessment & Plan Note (Signed)
Acute for the last 2-3 months, worse 6 weeks ago and continued.  Likely stress related, but will be checking labs to rule out other causes.   Rx for low dose mirtazapine sent to pharmacy. She will update.

## 2020-05-31 NOTE — Assessment & Plan Note (Signed)
Not currently on statin therapy, repeat lipids pending. Will initiate treatment once lipids return.

## 2020-05-31 NOTE — Assessment & Plan Note (Signed)
Increased recently due to lack of appetite which causes reflux. We will be working to help her with appetite stimulation.

## 2020-05-31 NOTE — Assessment & Plan Note (Signed)
Down to 6 cigarettes daily. I offered lung cancer screening for which she kindly declines given cost.

## 2020-05-31 NOTE — Assessment & Plan Note (Addendum)
Acute for 2-3 months, increased over last 6 weeks.  Could be stress related, but stress has reduced. Checking labs including TSH, CBC, CMP, vitamin levels.  UA today negative.  Encouraged tobacco cessation. Will trial very low dose of mirtazapine to help with appetite stimulation and sleep.  Await results.

## 2020-05-31 NOTE — Assessment & Plan Note (Signed)
Evident on CT chest from 2019, not currently on statin therapy, but does agree to start.  Checking lipid panel today, this will guide [place] on our dose for treatment. Await labs.

## 2020-05-31 NOTE — Patient Instructions (Addendum)
Stop by the lab prior to leaving today. I will notify you of your results once received.   Start mirtazapine (Remeron) 15 mg for sleep and appetite. Take 1/2 tablet by mouth at bedtime. Please update me.  It was a pleasure to see you today!

## 2020-06-01 ENCOUNTER — Other Ambulatory Visit: Payer: Self-pay | Admitting: Primary Care

## 2020-06-01 DIAGNOSIS — E559 Vitamin D deficiency, unspecified: Secondary | ICD-10-CM

## 2020-06-01 LAB — HIV ANTIBODY (ROUTINE TESTING W REFLEX): HIV 1&2 Ab, 4th Generation: NONREACTIVE

## 2020-06-01 MED ORDER — VITAMIN D (ERGOCALCIFEROL) 1.25 MG (50000 UNIT) PO CAPS: ORAL_CAPSULE | ORAL | 1 refills | Status: DC

## 2020-06-17 IMAGING — CT CT CHEST LUNG CANCER SCREENING LOW DOSE W/O CM
2 of 5 series · 15 of 40 positions shown, 18 images · non-contrast
Comparison: None

CLINICAL DATA: Forty-one pack-year history. Current asymptomatic
smoker.

EXAM:
CT CHEST WITHOUT CONTRAST LOW-DOSE FOR LUNG CANCER SCREENING
TECHNIQUE: Multidetector CT imaging of the chest was performed following the
standard protocol without IV contrast.

[Series 3: lung · axial · 0.58mm/px · z∈[-1243,-911]mm · 12 of 366 slices shown, 15 images (1 of 2)]
[im 17/366  mediastinal]
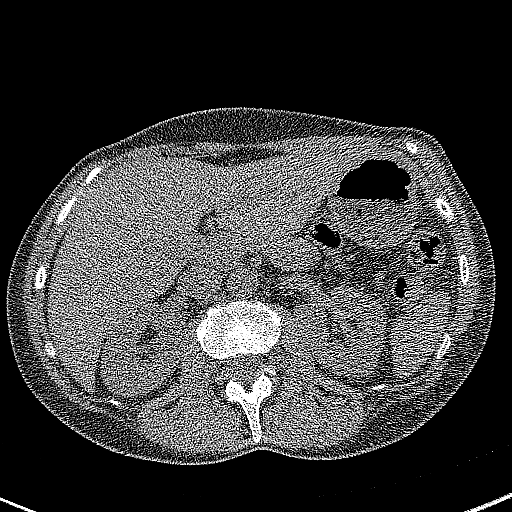
[im 17/366  lung]
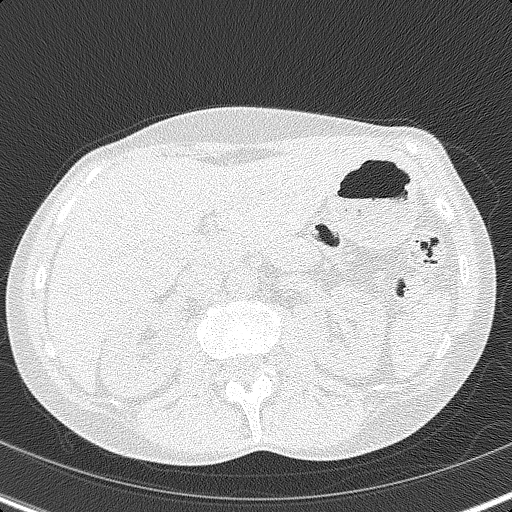
[im 50/366  lung]
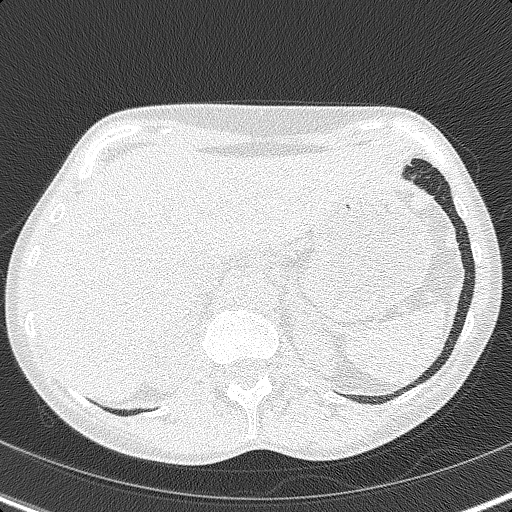
[im 83/366  lung]
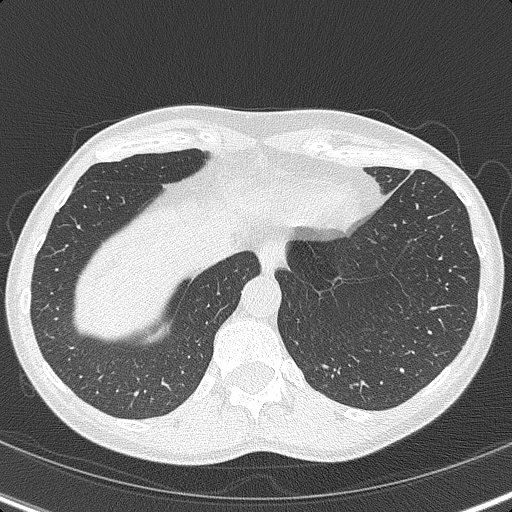
[im 117/366  lung]
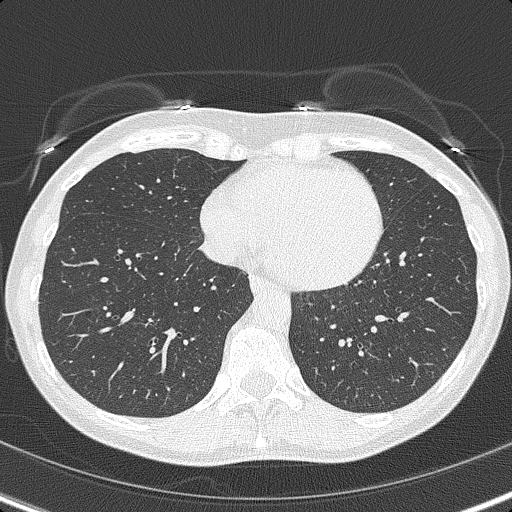
[im 133/366  mediastinal]
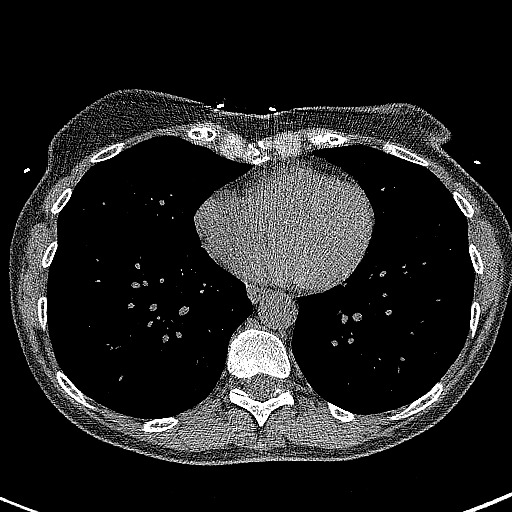
[im 133/366  lung]
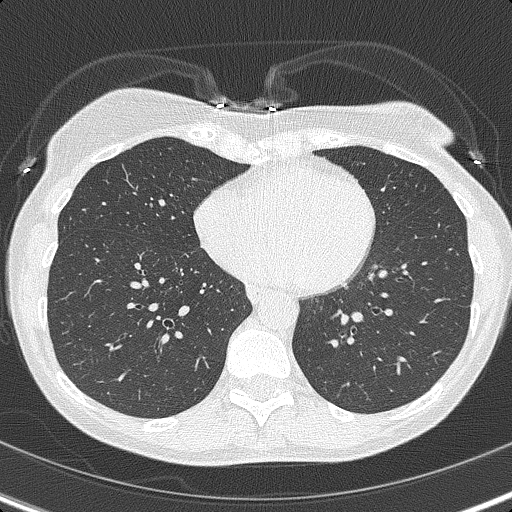
[im 166/366  lung]
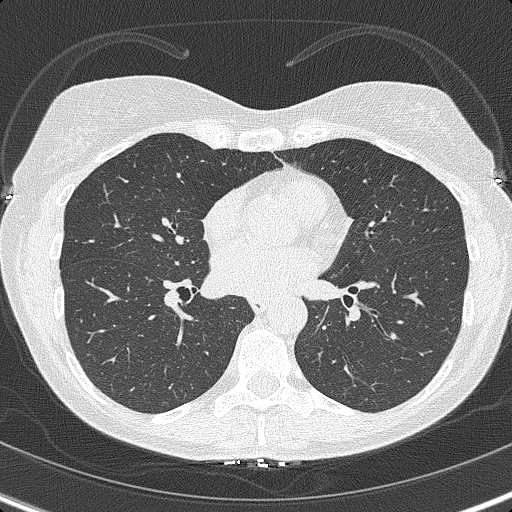
[im 200/366  lung]
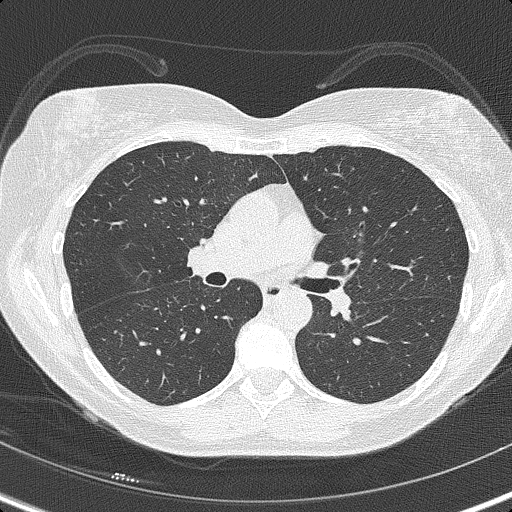
[im 233/366  lung]
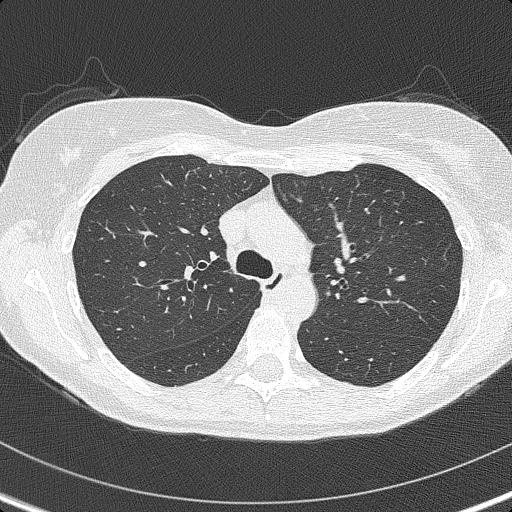
[im 249/366  mediastinal]
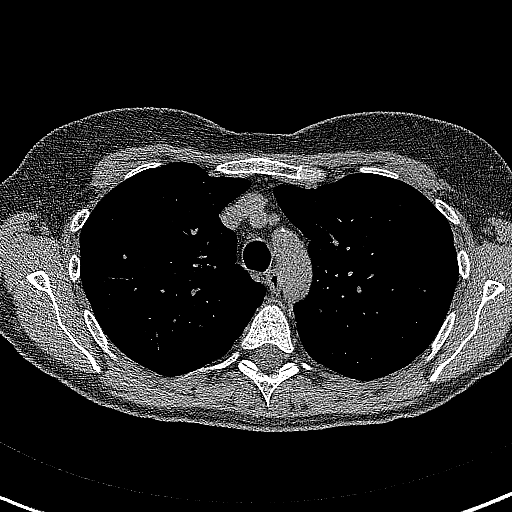
[im 249/366  lung]
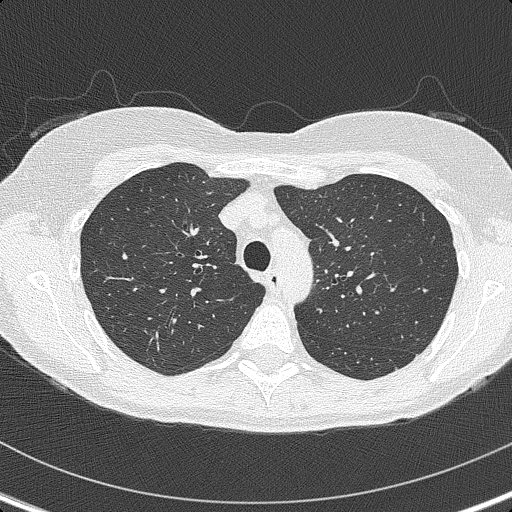
[im 283/366  lung]
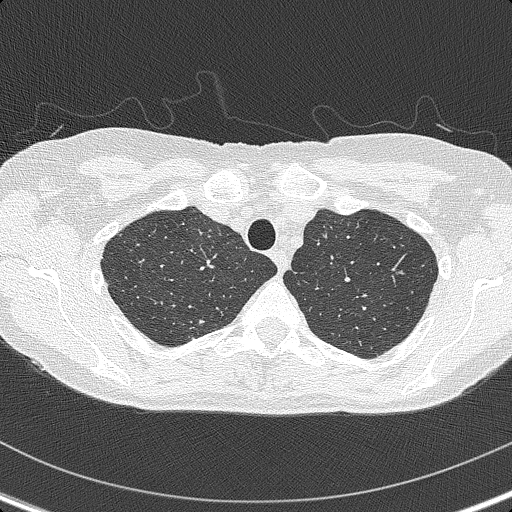
[im 316/366  lung]
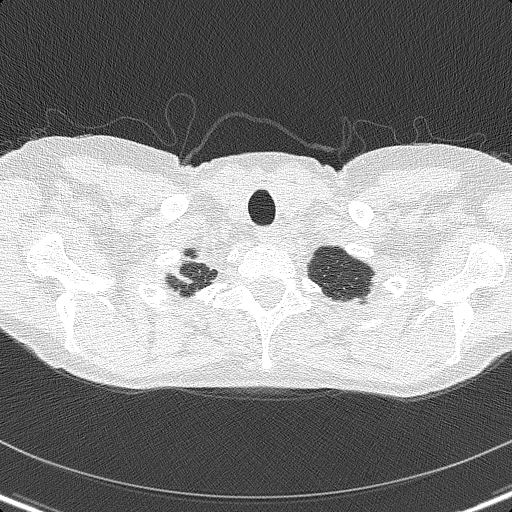
[im 349/366  lung]
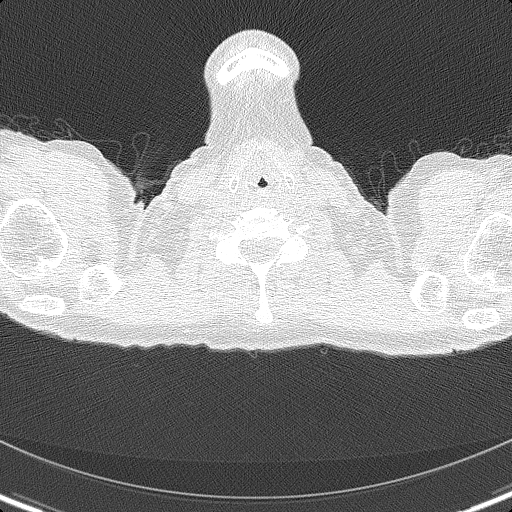

[Series 4: lung · coronal · 0.58mm/px · 3 of 291 slices shown (2 of 2)]
[im 59/291  lung]
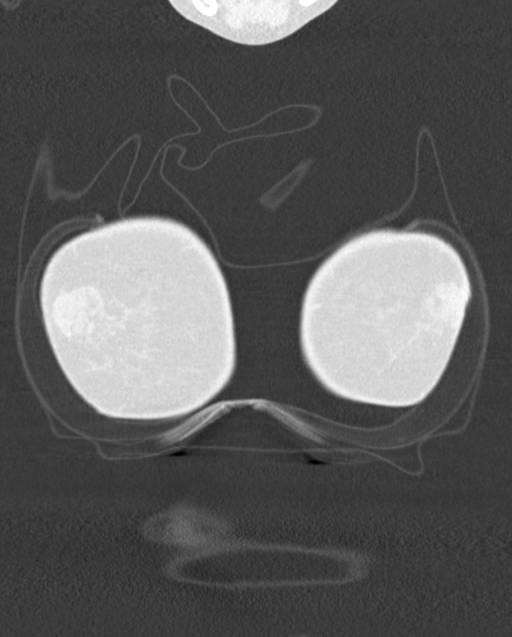
[im 117/291  lung]
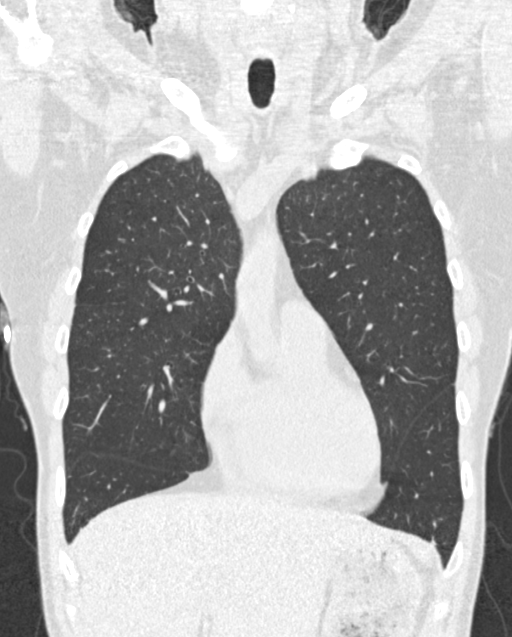
[im 175/291  lung]
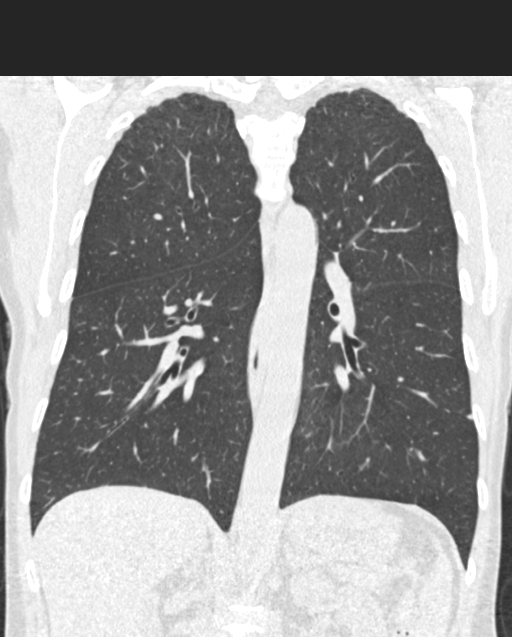

[15 of 40 positions shown; findings below may reference images not displayed]

FINDINGS: Cardiovascular: No significant vascular findings. Normal heart size.
No pericardial effusion.

Mediastinum/Nodes: No enlarged mediastinal, hilar, or axillary lymph
nodes. Thyroid gland, trachea, and esophagus demonstrate no
significant findings.

Lungs/Pleura: No pleural effusion. Mild to moderate changes of
emphysema. No airspace consolidation, atelectasis or pneumothorax. A
few small scattered noncalcified nodules are identified. The largest
is in the subpleural left lower lobe with an equivalent diameter of
3.4 mm.

Upper Abdomen: No acute abnormality.

Musculoskeletal: No chest wall mass or suspicious bone lesions
identified.
IMPRESSION: 1. Lung-RADS 2, benign appearance or behavior. Continue annual
screening with low-dose chest CT without contrast in 12 months.
2. Aortic Atherosclerosis (CO1Z1-0HA.A) and Emphysema (CO1Z1-HEE.Q).

## 2020-06-27 ENCOUNTER — Telehealth: Payer: Self-pay

## 2020-06-27 NOTE — Telephone Encounter (Signed)
Pt said last night she was petting a neighborhood cat; pt does not know which neighbor the cat belongs to and the cat bit the pt on rt hand at base of thumb; pt is not sure if cat has had its shots. Today the area is sore, red and angry looking. Pt has cleaned the area but wants abx. Pt said yrs ago was bitten by a cat and pt had to have surgery due to abscess on tendon of hand. Pt could not be seen today due to pts schedule but pt scheduled appt with Dr Silvio Pate on 06/28/20 at 8:30. UC & Ed precautions given and pt voiced understanding. Pt said if she gets more concerned later today pt will go to UC(pt will cb and cancel AM appt with Dr Silvio Pate if she goes to [facility] today) otherwise will keep appt in AM. FYI to Dr Silvio Pate.

## 2020-06-27 NOTE — Telephone Encounter (Signed)
Noted  

## 2020-06-27 NOTE — Telephone Encounter (Signed)
Noted I will be happy to see her tomorrow morning if she doesn't go somewhere later today

## 2020-06-28 ENCOUNTER — Other Ambulatory Visit: Payer: Self-pay

## 2020-06-28 ENCOUNTER — Ambulatory Visit: Payer: 59 | Admitting: Internal Medicine

## 2020-06-28 ENCOUNTER — Encounter: Payer: Self-pay | Admitting: Internal Medicine

## 2020-06-28 DIAGNOSIS — S61452A Open bite of left hand, initial encounter: Secondary | ICD-10-CM | POA: Insufficient documentation

## 2020-06-28 DIAGNOSIS — W5501XA Bitten by cat, initial encounter: Secondary | ICD-10-CM

## 2020-06-28 HISTORY — DX: Open bite of left hand, initial encounter: S61.452A

## 2020-06-28 MED ORDER — AMOXICILLIN-POT CLAVULANATE 875-125 MG PO TABS
1.0000 | ORAL_TABLET | Freq: Two times a day (BID) | ORAL | 0 refills | Status: DC
Start: 2020-06-28 — End: 2020-07-20

## 2020-06-28 NOTE — Progress Notes (Signed)
Subjective:    Patient ID: Amber Roberts, female    DOB: 1961/08/11, 59 y.o.   MRN: [id number]  HPI Here due to a cat bite This visit occurred during the SARS-CoV-2 public health emergency.  Safety protocols were in place, including screening questions prior to the visit, additional usage of staff PPE, and extensive cleaning of exam room while observing appropriate contact time as indicated for disinfecting solutions.   Got bit by cat in front yard The cat belongs to one of the neighbors--and she has seen it before Was petting the cat---and she has pet it before without a problem  Some redness started yesterday Not worse now Chronic pain due to arthritis--but noticed more pain  Current Outpatient Medications on File Prior to Visit  Medication Sig Dispense Refill  . lansoprazole (PREVACID) 15 MG capsule     . mirtazapine (REMERON) 15 MG tablet Take 0.5-1 tablets (7.5-15 mg total) by mouth at bedtime. For sleep and appetite. 30 tablet 0  . Vitamin D, Ergocalciferol, (DRISDOL) 1.25 MG (50000 UNIT) CAPS capsule Take 1 capsule by mouth once weekly. 12 capsule 1   No current facility-administered medications on file prior to visit.    Allergies  Allergen Reactions  . Darvon [Propoxyphene Hcl] Nausea And Vomiting    Past Medical History:  Diagnosis Date  . Abnormal Pap smear of vagina    abnl paps of vaginal cuff; last pap 11/2003  . Cervical cancer ([place]) early 20's  . Chest pain   . Fibromyalgia   . GERD (gastroesophageal reflux disease)   . Hyperlipidemia   . Osteopenia   . Premature menopause [age]  . Seasonal allergies    now mostly yearlong  . Seizure ([place]) 2010   per pt, related to lowered seizure threshold from imipramine and tramadol)--saw Dr. Jannifer Franklin  . Smoker     Past Surgical History:  Procedure Laterality Date  . surgery for cervical cancer    . VAGINAL HYSTERECTOMY     fibroids  . WRIST SURGERY     deQuervains    Family History  Problem Relation Age  of Onset  . Scoliosis Mother   . Cancer Father 41       testicular cancer  . Autism Son   . Cancer Maternal Uncle        esophageal (smoker)  . Macular degeneration Maternal Grandmother        wet  . Hypertension Maternal Grandmother   . Hyperlipidemia Maternal Grandmother   . Depression Maternal Grandmother        related to age, loss of vision  . Colitis Maternal Grandmother   . Breast cancer Maternal Grandmother   . Breast cancer Paternal Grandfather   . Diabetes Neg Hx     Social History   Socioeconomic History  . Marital status: Single    Spouse name: Not on file  . Number of children: 1  . Years of education: Not on file  . Highest education level: Not on file  Occupational History  . 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 Smoking status: Current Every Day Smoker    Packs/day: 1.00    Years: 41.00    Pack years: 41.00    Types: Cigarettes  . Smokeless tobacco: Never Used  Vaping Use  . Vaping Use: Never used  Substance and Sexual Activity  . Alcohol use: Yes    Alcohol/week: 14.0 standard drinks    Types: 14 Cans of beer per  week    Comment: 2-3 beers per day.  . Drug use: No  . Sexual activity: Not Currently  Other Topics Concern  . Not on file  Social History Narrative   Lives at home with 59 year old autistic son.  5 cats, 1 dog.     Social Determinants of Health   Financial Resource Strain:   . Difficulty of Paying Living Expenses: Not on file  Food Insecurity:   . Worried About Charity fundraiser in the Last Year: Not on file  . Ran Out of Food in the Last Year: Not on file  Transportation Needs:   . Lack of Transportation (Medical): Not on file  . Lack of Transportation (Non-Medical): Not on file  Physical Activity:   . Days of Exercise per Week: Not on file  . Minutes of Exercise per Session: Not on file  Stress:   . Feeling of Stress : Not on file  Social Connections:   . Frequency of Communication with Friends  and Family: Not on file  . Frequency of Social Gatherings with Friends and Family: Not on file  . Attends Religious Services: Not on file  . Active Member of Clubs or Organizations: Not on file  . Attends Archivist Meetings: Not on file  . Marital Status: Not on file  Intimate Partner Violence:   . Fear of Current or Ex-Partner: Not on file  . Emotionally Abused: Not on file  . Physically Abused: Not on file  . Sexually Abused: Not on file   Review of Systems  No fever Not sick     Objective:   Physical Exam Constitutional:      Appearance: Normal appearance.  Skin:    Comments: Small puncture wounds on either side of right thumb--mild redness Another one over extensor side of thumb--not particularly inflamed  Neurological:     Mental Status: She is alert.            Assessment & Plan:

## 2020-06-28 NOTE — Assessment & Plan Note (Signed)
Mild inflammation but in hand, and puncture Will give augmentin for 1 week Discussed Td booster---is sure it was 2013 (wishes to hold off on this)

## 2020-07-15 ENCOUNTER — Emergency Department (HOSPITAL_COMMUNITY): Payer: 59

## 2020-07-15 ENCOUNTER — Inpatient Hospital Stay (HOSPITAL_COMMUNITY)
Admission: EM | Admit: 2020-07-15 | Discharge: 2020-07-20 | DRG: 438 | Disposition: A | Discharge status: Home / Self Care | Payer: 59 | Attending: Internal Medicine | Admitting: Internal Medicine

## 2020-07-15 ENCOUNTER — Encounter (HOSPITAL_COMMUNITY): Payer: Self-pay

## 2020-07-15 ENCOUNTER — Other Ambulatory Visit: Payer: Self-pay

## 2020-07-15 DIAGNOSIS — R778 Other specified abnormalities of plasma proteins: Secondary | ICD-10-CM | POA: Diagnosis not present

## 2020-07-15 DIAGNOSIS — Z20822 Contact with and (suspected) exposure to covid-19: Secondary | ICD-10-CM | POA: Diagnosis present

## 2020-07-15 DIAGNOSIS — I2510 Atherosclerotic heart disease of native coronary artery without angina pectoris: Secondary | ICD-10-CM | POA: Diagnosis present

## 2020-07-15 DIAGNOSIS — D7589 Other specified diseases of blood and blood-forming organs: Secondary | ICD-10-CM | POA: Diagnosis present

## 2020-07-15 DIAGNOSIS — F1020 Alcohol dependence, uncomplicated: Secondary | ICD-10-CM | POA: Diagnosis present

## 2020-07-15 DIAGNOSIS — K859 Acute pancreatitis without necrosis or infection, unspecified: Secondary | ICD-10-CM | POA: Diagnosis present

## 2020-07-15 DIAGNOSIS — I10 Essential (primary) hypertension: Secondary | ICD-10-CM | POA: Diagnosis present

## 2020-07-15 DIAGNOSIS — Z90710 Acquired absence of both cervix and uterus: Secondary | ICD-10-CM

## 2020-07-15 DIAGNOSIS — K8500 Idiopathic acute pancreatitis without necrosis or infection: Secondary | ICD-10-CM | POA: Diagnosis not present

## 2020-07-15 DIAGNOSIS — G8929 Other chronic pain: Secondary | ICD-10-CM | POA: Diagnosis present

## 2020-07-15 DIAGNOSIS — R109 Unspecified abdominal pain: Secondary | ICD-10-CM | POA: Diagnosis present

## 2020-07-15 DIAGNOSIS — M797 Fibromyalgia: Secondary | ICD-10-CM | POA: Diagnosis present

## 2020-07-15 DIAGNOSIS — E538 Deficiency of other specified B group vitamins: Secondary | ICD-10-CM | POA: Diagnosis present

## 2020-07-15 DIAGNOSIS — F17200 Nicotine dependence, unspecified, uncomplicated: Secondary | ICD-10-CM | POA: Diagnosis present

## 2020-07-15 DIAGNOSIS — J9601 Acute respiratory failure with hypoxia: Secondary | ICD-10-CM | POA: Diagnosis not present

## 2020-07-15 DIAGNOSIS — Z8541 Personal history of malignant neoplasm of cervix uteri: Secondary | ICD-10-CM | POA: Diagnosis not present

## 2020-07-15 DIAGNOSIS — D696 Thrombocytopenia, unspecified: Secondary | ICD-10-CM | POA: Diagnosis present

## 2020-07-15 DIAGNOSIS — E785 Hyperlipidemia, unspecified: Secondary | ICD-10-CM | POA: Diagnosis present

## 2020-07-15 DIAGNOSIS — K852 Alcohol induced acute pancreatitis without necrosis or infection: Secondary | ICD-10-CM | POA: Diagnosis present

## 2020-07-15 DIAGNOSIS — I252 Old myocardial infarction: Secondary | ICD-10-CM

## 2020-07-15 DIAGNOSIS — F17210 Nicotine dependence, cigarettes, uncomplicated: Secondary | ICD-10-CM | POA: Diagnosis present

## 2020-07-15 DIAGNOSIS — R636 Underweight: Secondary | ICD-10-CM | POA: Diagnosis present

## 2020-07-15 DIAGNOSIS — E876 Hypokalemia: Secondary | ICD-10-CM | POA: Diagnosis present

## 2020-07-15 DIAGNOSIS — E871 Hypo-osmolality and hyponatremia: Secondary | ICD-10-CM | POA: Diagnosis present

## 2020-07-15 DIAGNOSIS — E8770 Fluid overload, unspecified: Secondary | ICD-10-CM | POA: Diagnosis not present

## 2020-07-15 DIAGNOSIS — R079 Chest pain, unspecified: Secondary | ICD-10-CM | POA: Diagnosis not present

## 2020-07-15 DIAGNOSIS — K219 Gastro-esophageal reflux disease without esophagitis: Secondary | ICD-10-CM | POA: Diagnosis present

## 2020-07-15 HISTORY — DX: Acute pancreatitis without necrosis or infection, unspecified: K85.90

## 2020-07-15 LAB — COMPREHENSIVE METABOLIC PANEL
ALT: 25 U/L (ref 0–44)
AST: 75 U/L — ABNORMAL HIGH (ref 15–41)
Albumin: 4.7 g/dL (ref 3.5–5.0)
Alkaline Phosphatase: 111 U/L (ref 38–126)
Anion gap: 16 — ABNORMAL HIGH (ref 5–15)
BUN: 7 mg/dL (ref 6–20)
CO2: 21 mmol/L — ABNORMAL LOW (ref 22–32)
Calcium: 9.6 mg/dL (ref 8.9–10.3)
Chloride: 98 mmol/L (ref 98–111)
Creatinine, Ser: 0.6 mg/dL (ref 0.44–1.00)
GFR calc Af Amer: >60 mL/min (ref >60)
GFR calc non Af Amer: >60 mL/min (ref >60)
Glucose, Bld: 151 mg/dL — ABNORMAL HIGH (ref 70–99)
Potassium: 3.8 mmol/L (ref 3.5–5.1)
Sodium: 135 mmol/L (ref 135–145)
Total Bilirubin: 2.2 mg/dL — ABNORMAL HIGH (ref 0.3–1.2)
Total Protein: 8 g/dL (ref 6.5–8.1)

## 2020-07-15 LAB — D-DIMER, QUANTITATIVE: D-Dimer, Quant: 1.75 ug/mL-FEU — ABNORMAL HIGH (ref 0.00–0.50)

## 2020-07-15 LAB — CBC WITH DIFFERENTIAL/PLATELET
Abs Immature Granulocytes: 0.03 10*3/uL (ref 0.00–0.07)
Basophils Absolute: 0 10*3/uL (ref 0.0–0.1)
Basophils Relative: 0 %
Eosinophils Absolute: 0 10*3/uL (ref 0.0–0.5)
Eosinophils Relative: 0 %
HCT: 42.6 % (ref 36.0–46.0)
Hemoglobin: 14.8 g/dL (ref 12.0–15.0)
Immature Granulocytes: 0 %
Lymphocytes Relative: 4 %
Lymphs Abs: 0.5 10*3/uL — ABNORMAL LOW (ref 0.7–4.0)
MCH: 35.9 pg — ABNORMAL HIGH (ref 26.0–34.0)
MCHC: 34.7 g/dL (ref 30.0–36.0)
MCV: 103.4 fL — ABNORMAL HIGH (ref 80.0–100.0)
Monocytes Absolute: 0.5 10*3/uL (ref 0.1–1.0)
Monocytes Relative: 5 %
Neutro Abs: 9.4 10*3/uL — ABNORMAL HIGH (ref 1.7–7.7)
Neutrophils Relative %: 91 %
Platelets: 156 10*3/uL (ref 150–400)
RBC: 4.12 MIL/uL (ref 3.87–5.11)
RDW: 14.8 % (ref 11.5–15.5)
WBC: 10.4 10*3/uL (ref 4.0–10.5)
nRBC: 0 % (ref 0.0–0.2)

## 2020-07-15 LAB — SARS CORONAVIRUS 2 BY RT PCR (HOSPITAL ORDER, PERFORMED IN ~~LOC~~ HOSPITAL LAB): SARS Coronavirus 2: NEGATIVE

## 2020-07-15 LAB — LIPASE, BLOOD: Lipase: 654 U/L — ABNORMAL HIGH (ref 11–51)

## 2020-07-15 LAB — TROPONIN I (HIGH SENSITIVITY)
Troponin I (High Sensitivity): 27 ng/L — ABNORMAL HIGH (ref <18)
Troponin I (High Sensitivity): 35 ng/L — ABNORMAL HIGH (ref <18)

## 2020-07-15 MED ORDER — ENOXAPARIN SODIUM 40 MG/0.4ML ~~LOC~~ SOLN
40.0000 mg | SUBCUTANEOUS | Status: DC
  Administered 2020-07-15 – 2020-07-19 (×4): 40 mg via SUBCUTANEOUS
  Filled 2020-07-15 (×5): qty 0.4

## 2020-07-15 MED ORDER — LORAZEPAM 2 MG/ML IJ SOLN: 1.0000 mg | INTRAMUSCULAR | Status: DC | PRN

## 2020-07-15 MED ORDER — HYDROMORPHONE HCL 1 MG/ML IJ SOLN
0.5000 mg | Freq: Once | INTRAMUSCULAR | Status: AC
  Administered 2020-07-15: 0.5 mg via INTRAVENOUS
  Filled 2020-07-15: qty 1

## 2020-07-15 MED ORDER — ONDANSETRON HCL 4 MG PO TABS: 4.0000 mg | ORAL_TABLET | Freq: Four times a day (QID) | ORAL | Status: DC | PRN

## 2020-07-15 MED ORDER — IOHEXOL 350 MG/ML SOLN
100.0000 mL | Freq: Once | INTRAVENOUS | Status: AC | PRN
  Administered 2020-07-15: 75 mL via INTRAVENOUS

## 2020-07-15 MED ORDER — LORAZEPAM 1 MG PO TABS: 1.0000 mg | ORAL_TABLET | ORAL | Status: DC | PRN

## 2020-07-15 MED ORDER — HYDROMORPHONE HCL 1 MG/ML IJ SOLN
1.0000 mg | INTRAMUSCULAR | Status: DC | PRN
  Administered 2020-07-15 – 2020-07-18 (×14): 1 mg via INTRAVENOUS
  Filled 2020-07-15 (×14): qty 1

## 2020-07-15 MED ORDER — PANTOPRAZOLE SODIUM 40 MG IV SOLR
40.0000 mg | INTRAVENOUS | Status: DC
  Administered 2020-07-15 – 2020-07-19 (×5): 40 mg via INTRAVENOUS
  Filled 2020-07-15 (×5): qty 40

## 2020-07-15 MED ORDER — THIAMINE HCL 100 MG/ML IJ SOLN
Freq: Once | INTRAVENOUS | Status: AC
  Filled 2020-07-15: qty 1000

## 2020-07-15 MED ORDER — ONDANSETRON HCL 4 MG/2ML IJ SOLN
4.0000 mg | Freq: Four times a day (QID) | INTRAMUSCULAR | Status: DC | PRN
  Administered 2020-07-15 – 2020-07-18 (×8): 4 mg via INTRAVENOUS
  Filled 2020-07-15 (×8): qty 2

## 2020-07-15 MED ORDER — SODIUM CHLORIDE 0.45 % IV SOLN: INTRAVENOUS | Status: DC

## 2020-07-15 MED ORDER — ONDANSETRON HCL 4 MG/2ML IJ SOLN
4.0000 mg | Freq: Once | INTRAMUSCULAR | Status: AC
  Administered 2020-07-15: 4 mg via INTRAVENOUS
  Filled 2020-07-15: qty 2

## 2020-07-15 MED ORDER — NITROGLYCERIN 0.4 MG SL SUBL
0.4000 mg | SUBLINGUAL_TABLET | Freq: Once | SUBLINGUAL | Status: AC
  Administered 2020-07-15: 0.4 mg via SUBLINGUAL
  Filled 2020-07-15: qty 1

## 2020-07-15 NOTE — Progress Notes (Signed)
ED TO INPATIENT HANDOFF REPORT  ED Nurse Name and Phone #: Bretta Bang [phone]   Name/Age/Gender Amber Roberts 59 y.o. female Room/Bed: WA09/WA09  Code Status   Code Status: Full Code  Home/SNF/Other Home Patient oriented to: self, place, time and situation Is this baseline? Yes   Triage Complete: Triage complete  Chief Complaint Acute pancreatitis [K85.90]  Triage Note Patient c/o mid chest pain since last night. Patient states she has been when she takes a deep breath. Patient also c/o emesis since around midnight.     Allergies Allergies  Allergen Reactions  . Darvon [Propoxyphene Hcl] Nausea And Vomiting    Level of Care/Admitting Diagnosis ED Disposition    ED Disposition Condition Comment   Admit  Hospital Area: [facility] [[id number]]  Level of Care: Telemetry [5]  Admit to tele based on following criteria: Monitor for Ischemic changes  May admit patient to [facility] or [facility] if equivalent level of care is available:: Yes  Covid Evaluation: Asymptomatic Screening Protocol (No Symptoms)  Diagnosis: Acute pancreatitis [577.0.ICD-9-CM]  Admitting Physician: Gary Fleet  Attending Physician: Gary Fleet  Estimated length of stay: 3 - 4 days  Certification:: I certify this patient will need inpatient services for at least 2 midnights       B Medical/Surgery History Past Medical History:  Diagnosis Date  . Abnormal Pap smear of vagina    abnl paps of vaginal cuff; last pap 11/2003  . Cervical cancer ([place]) early 20's  . Chest pain   . Fibromyalgia   . GERD (gastroesophageal reflux disease)   . Hyperlipidemia   . MI (myocardial infarction) ([place])   . Osteopenia   . Premature menopause [age]  . Seasonal allergies    now mostly yearlong  . Seizure ([place]) 2010   per pt, related to lowered seizure threshold from imipramine and tramadol)--saw Dr. Jannifer Franklin  . Smoker    Past Surgical History:  Procedure  Laterality Date  . surgery for cervical cancer    . VAGINAL HYSTERECTOMY     fibroids  . WRIST SURGERY     deQuervains     A IV Location/Drains/Wounds Patient Lines/Drains/Airways Status    Active Line/Drains/Airways    Name Placement date Placement time Site Days   Peripheral IV 07/15/20 Left Antecubital 07/15/20  1106  Antecubital  less than 1          Intake/Output Last 24 hours  Intake/Output Summary (Last 24 hours) at 07/15/2020 2142 Last data filed at 07/15/2020 1905 Gross per 24 hour  Intake 693.47 ml  Output --  Net 693.47 ml    Labs/Imaging Results for orders placed or performed during the hospital encounter of 07/15/20 (from the past 48 hour(s))  Troponin I (High Sensitivity)     Status: Abnormal   Collection Time: 07/15/20  8:52 AM  Result Value Ref Range   Troponin I (High Sensitivity) 27 (H) <18 ng/L    Comment: (NOTE) Elevated high sensitivity troponin I (hsTnI) values and significant  changes across serial measurements may suggest ACS but many other  chronic and acute conditions are known to elevate hsTnI results.  Refer to the "Links" section for chest pain algorithms and additional  guidance. Performed at [facility], [place] [address] [place], [place] [address]   D-dimer, quantitative     Status: Abnormal   Collection Time: 07/15/20  8:52 AM  Result Value Ref Range   D-Dimer, Quant 1.75 (H) 0.00 - 0.50 ug/mL-FEU  Comment: (NOTE) At the manufacturer cut-off of 0.50 ug/mL FEU, this assay has been documented to exclude PE with a sensitivity and negative predictive value of 97 to 99%.  At this time, this assay has not been approved by the FDA to exclude DVT/VTE. Results should be correlated with clinical presentation. Performed at [facility], [place] [address], [place], [place] [address]   CBC with Differential     Status: Abnormal   Collection Time: 07/15/20  8:52 AM  Result Value Ref Range   WBC 10.4 4.0 -  10.5 K/uL   RBC 4.12 3.87 - 5.11 MIL/uL   Hemoglobin 14.8 12.0 - 15.0 g/dL   HCT 42.6 36 - 46 %   MCV 103.4 (H) 80.0 - 100.0 fL   MCH 35.9 (H) 26.0 - 34.0 pg   MCHC 34.7 30.0 - 36.0 g/dL   RDW 14.8 11.5 - 15.5 %   Platelets 156 150 - 400 K/uL   nRBC 0.0 0.0 - 0.2 %   Neutrophils Relative % 91 %   Neutro Abs 9.4 (H) 1.7 - 7.7 K/uL   Lymphocytes Relative 4 %   Lymphs Abs 0.5 (L) 0.7 - 4.0 K/uL   Monocytes Relative 5 %   Monocytes Absolute 0.5 0 - 1 K/uL   Eosinophils Relative 0 %   Eosinophils Absolute 0.0 0 - 0 K/uL   Basophils Relative 0 %   Basophils Absolute 0.0 0 - 0 K/uL   Immature Granulocytes 0 %   Abs Immature Granulocytes 0.03 0.00 - 0.07 K/uL    Comment: Performed at [facility], [place] [address] [place], [place] [address]  Comprehensive metabolic panel     Status: Abnormal   Collection Time: 07/15/20  8:52 AM  Result Value Ref Range   Sodium 135 135 - 145 mmol/L   Potassium 3.8 3.5 - 5.1 mmol/L   Chloride 98 98 - 111 mmol/L   CO2 21 (L) 22 - 32 mmol/L   Glucose, Bld 151 (H) 70 - 99 mg/dL    Comment: Glucose reference range applies only to samples taken after fasting for at least 8 hours.   BUN 7 6 - 20 mg/dL   Creatinine, Ser 0.60 0.44 - 1.00 mg/dL   Calcium 9.6 8.9 - 10.3 mg/dL   Total Protein 8.0 6.5 - 8.1 g/dL   Albumin 4.7 3.5 - 5.0 g/dL   AST 75 (H) 15 - 41 U/L   ALT 25 0 - 44 U/L   Alkaline Phosphatase 111 38 - 126 U/L   Total Bilirubin 2.2 (H) 0.3 - 1.2 mg/dL   GFR calc non Af Amer >60 >60 mL/min   GFR calc Af Amer >60 >60 mL/min   Anion gap 16 (H) 5 - 15    Comment: Performed at [facility], [place] [address] [place], [place] [address]  Troponin I (High Sensitivity)     Status: Abnormal   Collection Time: 07/15/20 11:11 AM  Result Value Ref Range   Troponin I (High Sensitivity) 35 (H) <18 ng/L    Comment: (NOTE) Elevated high sensitivity troponin I (hsTnI) values and significant  changes across serial  measurements may suggest ACS but many other  chronic and acute conditions are known to elevate hsTnI results.  Refer to the "Links" section for chest pain algorithms and additional  guidance. Performed at [facility], [place] [address] [place], [place] [address]   Lipase, blood     Status: Abnormal  Collection Time: 07/15/20 11:11 AM  Result Value Ref Range   Lipase 654 (H) 11 - 51 U/L    Comment: RESULTS CONFIRMED BY MANUAL DILUTION Performed at [facility], [place] [address], [place], [place] [address]   SARS Coronavirus 2 by RT PCR (hospital order, performed in [facility] hospital lab) Nasopharyngeal Nasopharyngeal Swab     Status: None   Collection Time: 07/15/20 11:37 AM   Specimen: Nasopharyngeal Swab  Result Value Ref Range   SARS Coronavirus 2 NEGATIVE NEGATIVE    Comment: (NOTE) SARS-CoV-2 target nucleic acids are NOT DETECTED.  The SARS-CoV-2 RNA is generally detectable in upper and lower respiratory specimens during the acute phase of infection. The lowest concentration of SARS-CoV-2 viral copies this assay can detect is 250 copies / mL. A negative result does not preclude SARS-CoV-2 infection and should not be used as the sole basis for treatment or other patient management decisions.  A negative result may occur with improper specimen collection / handling, submission of specimen other than nasopharyngeal swab, presence of viral mutation(s) within the areas targeted by this assay, and inadequate number of viral copies (<250 copies / mL). A negative result must be combined with clinical observations, patient history, and epidemiological information.  Fact Sheet for Patients:   StrictlyIdeas.no  Fact Sheet for Healthcare Providers: BankingDealers.co.za  This test is not yet approved or  cleared by the [place] FDA and has been authorized for detection and/or diagnosis of  SARS-CoV-2 by FDA under an Emergency Use Authorization (EUA).  This EUA will remain in effect (meaning this test can be used) for the duration of the COVID-19 declaration under Section 564(b)(1) of the Act, 21 U.S.C. section 360bbb-3(b)(1), unless the authorization is terminated or revoked sooner.  Performed at [facility], [place] [address], [place], [place] [address]    CT Angio Chest PE W and/or Wo Contrast  Result Date: 07/15/2020 CLINICAL DATA:  Chest pain since last evening. Positive D-dimer. EXAM: CT ANGIOGRAPHY CHEST WITH CONTRAST TECHNIQUE: Multidetector CT imaging of the chest was performed using the standard protocol during bolus administration of intravenous contrast. Multiplanar CT image reconstructions and MIPs were obtained to evaluate the vascular anatomy. CONTRAST:  47mL OMNIPAQUE IOHEXOL 350 MG/ML SOLN COMPARISON:  09/23/2018 FINDINGS: Cardiovascular: The heart is normal in size. No pericardial effusion. Mild tortuosity of the thoracic aorta but no aneurysm or dissection. The branch vessels are patent. No definite coronary artery calcifications. The pulmonary arterial tree is well opacified. No filling defects to suggest pulmonary embolism. Mediastinum/Nodes: No mediastinal or hilar mass or adenopathy. The esophagus is grossly normal. The thyroid gland is unremarkable. Lungs/Pleura: The lungs are clear of an acute process. No pulmonary lesions or pleural effusions. Upper Abdomen: There appears to be some inflammatory type changes around the pancreatic tail and near the spleen. There is a small amount of fluid in the left anterior pararenal space. Recommend correlation with any symptoms of pancreatitis. Correlation with amylase level may be helpful. Musculoskeletal: No breast masses, supraclavicular or axillary adenopathy. No significant bony findings. Review of the MIP images confirms the above findings. IMPRESSION: 1. No CT findings for pulmonary embolism. 2. Normal  thoracic aorta. 3. No acute pulmonary findings. 4. Inflammatory type changes around the pancreatic tail and near the spleen. Recommend correlation with any symptoms of pancreatitis. Correlation with amylase level may be helpful. Electronically Signed   By: Marijo Sanes M.D.   On: 07/15/2020 12:11   DG Chest Portable 1 View  Result Date: 07/15/2020 CLINICAL DATA:  Chest pain. EXAM: PORTABLE CHEST 1 VIEW COMPARISON:  10/20/2011 FINDINGS: The heart size and mediastinal contours are within normal limits. Stable bilateral pulmonary hyperinflation and emphysematous disease. There is no evidence of pulmonary edema, consolidation, pneumothorax, nodule or pleural fluid. The visualized skeletal structures are unremarkable. IMPRESSION: Stable emphysematous lung disease. Electronically Signed   By: Aletta Edouard M.D.   On: 07/15/2020 08:33    Pending Labs Unresulted Labs (From admission, onward)          Start     Ordered   07/22/20 0500  Creatinine, serum  (enoxaparin (LOVENOX)    CrCl >/= 30 ml/min)  Weekly,   R     Comments: while on enoxaparin therapy    07/15/20 1448   07/16/20 1950  Basic metabolic panel  Tomorrow morning,   R        07/15/20 1448   07/16/20 0500  CBC  Tomorrow morning,   R        07/15/20 1448          Vitals/Pain Today's Vitals   07/15/20 2000 07/15/20 2011 07/15/20 2100 07/15/20 2108  BP: (!) 152/84  136/85   Pulse: 81  76   Resp: 19  12   Temp:      TempSrc:      SpO2: 97%  96%   Weight:      Height:      PainSc:  9   Asleep    Isolation Precautions No active isolations  Medications Medications  enoxaparin (LOVENOX) injection 40 mg (40 mg Subcutaneous Given 07/15/20 1724)  0.45 % sodium chloride infusion ( Intravenous Rate/Dose Verify 07/15/20 1905)  HYDROmorphone (DILAUDID) injection 1 mg (1 mg Intravenous Given 07/15/20 2013)  ondansetron (ZOFRAN) tablet 4 mg ( Oral See Alternative 07/15/20 2013)    Or  ondansetron (ZOFRAN) injection 4 mg (4 mg  Intravenous Given 07/15/20 2013)  pantoprazole (PROTONIX) injection 40 mg (40 mg Intravenous Given 07/15/20 1516)  LORazepam (ATIVAN) tablet 1-4 mg (has no administration in time range)    Or  LORazepam (ATIVAN) injection 1-4 mg (has no administration in time range)  ondansetron (ZOFRAN) injection 4 mg (4 mg Intravenous Given 07/15/20 1111)  nitroGLYCERIN (NITROSTAT) SL tablet 0.4 mg (0.4 mg Sublingual Given 07/15/20 1103)  iohexol (OMNIPAQUE) 350 MG/ML injection 100 mL (75 mLs Intravenous Contrast Given 07/15/20 1138)  HYDROmorphone (DILAUDID) injection 0.5 mg (0.5 mg Intravenous Given 07/15/20 1442)  dextrose 5 % and 0.45% NaCl 1,000 mL with thiamine 932 mg, folic acid 1 mg, multivitamins adult 10 mL infusion ( Intravenous Rate/Dose Verify 07/15/20 1905)    Mobility walks Low fall risk   Focused Assessments     Recommendations: See Admitting Provider Note  Report given to: Susa Griffins [phone]  Additional Notes:

## 2020-07-15 NOTE — ED Provider Notes (Signed)
[facility] DEPT Provider Note   CSN: [id number] Arrival date & time: 07/15/20  0745     History Chief Complaint  Patient presents with  . Chest Pain  . Emesis  . rib cage pain    Amber Roberts is a 59 y.o. female.  HPI Patient presents with chest pain.  States began around 4 in the morning yesterday morning.  Has been more than 24 hours now.  States that around midnight developed some nausea and vomiting.  States pain started in her back and she did not do anything she thought it was just her bone spurs.  Now involves the front part of her chest.  States it does hurt to breathe and may be feel short of breath.  No swelling her legs.  She does smoke.  States she has been told she had a heart attack before but states that may have come from stress and has not had a heart catheterization.  States that happened when she was 64.  No fevers.  No blood in the emesis.  States it hurts to get up and move.  States she thinks it is both because of the mechanics and the exertion.    Past Medical History:  Diagnosis Date  . Abnormal Pap smear of vagina    abnl paps of vaginal cuff; last pap 11/2003  . Cervical cancer ([place]) early 20's  . Chest pain   . Fibromyalgia   . GERD (gastroesophageal reflux disease)   . Hyperlipidemia   . MI (myocardial infarction) ([place])   . Osteopenia   . Premature menopause [age]  . Seasonal allergies    now mostly yearlong  . Seizure ([place]) 2010   per pt, related to lowered seizure threshold from imipramine and tramadol)--saw Dr. Jannifer Franklin  . Smoker     Patient Active Problem List   Diagnosis Date Noted  . Cat bite of left hand 06/28/2020  . Fatigue 05/31/2020  . Aortic atherosclerosis ([place]) 05/31/2020  . Decreased appetite 05/31/2020  . Chronic thoracic back pain 10/25/2019  . Chronic neck pain 04/01/2018  . Pain in both lower extremities 04/01/2018  . Palpitations 09/29/2017  . Osteoarthritis 08/13/2017  . Varicose  veins of lower extremity 08/13/2017  . Preventative health care 08/13/2017  . Abnormal EKG 08/12/2017  . Osteopenia 09/14/2012  . Metatarsal fracture 09/14/2012  . Tobacco use disorder 09/14/2012  . GERD (gastroesophageal reflux disease) 09/14/2012  . CAD (coronary artery disease) 09/14/2012  . Pure hypercholesterolemia 09/14/2012    Past Surgical History:  Procedure Laterality Date  . surgery for cervical cancer    . VAGINAL HYSTERECTOMY     fibroids  . WRIST SURGERY     deQuervains     OB History   No obstetric history on file.     Family History  Problem Relation Age of Onset  . Scoliosis Mother   . Cancer Father 53       testicular cancer  . Autism Son   . Cancer Maternal Uncle        esophageal (smoker)  . Macular degeneration Maternal Grandmother        wet  . Hypertension Maternal Grandmother   . Hyperlipidemia Maternal Grandmother   . Depression Maternal Grandmother        related to age, loss of vision  . Colitis Maternal Grandmother   . Breast cancer Maternal Grandmother   . Breast cancer Paternal Grandfather   . Diabetes Neg Hx     Social  History   Tobacco Use  . Smoking status: Current Every Day Smoker    Packs/day: 1.00    Years: 41.00    Pack years: 41.00    Types: Cigarettes  . Smokeless tobacco: Never Used  Vaping Use  . Vaping Use: Never used  Substance Use Topics  . Alcohol use: Yes    Alcohol/week: 14.0 standard drinks    Types: 14 Cans of beer per week    Comment: 2-3 beers per day.  . Drug use: No    Home Medications Prior to Admission medications   Medication Sig Start Date End Date Taking? Authorizing Provider  amoxicillin-clavulanate (AUGMENTIN) 875-125 MG tablet Take 1 tablet by mouth 2 (two) times daily. 06/28/20   Venia Carbon, MD  lansoprazole (PREVACID) 15 MG capsule  05/19/20   [provider]  mirtazapine (REMERON) 15 MG tablet Take 0.5-1 tablets (7.5-15 mg total) by mouth at bedtime. For sleep and  appetite. 05/31/20   [name], NP  Vitamin D, Ergocalciferol, (DRISDOL) 1.25 MG (50000 UNIT) CAPS capsule Take 1 capsule by mouth once weekly. 06/01/20   [name], NP    Allergies    Darvon [propoxyphene hcl]  Review of Systems   Review of Systems  Constitutional: Negative for appetite change and fever.  HENT: Negative for congestion.   Respiratory: Positive for shortness of breath. Negative for wheezing.   Cardiovascular: Positive for chest pain.  Gastrointestinal: Positive for nausea and vomiting.  Genitourinary: Negative for flank pain.  Musculoskeletal: Positive for back pain.  Skin: Negative for rash.  Neurological: Negative for weakness.  Psychiatric/Behavioral: Negative for confusion.    Physical Exam Updated Vital Signs BP (!) 157/99   Pulse 82   Temp 98.7 F (37.1 C) (Oral)   Resp 13   Ht 5\' 3"  (1.6 m)   Wt 47.6 kg   SpO2 99%   BMI 18.60 kg/m   Physical Exam Vitals and nursing note reviewed.  Constitutional:      Comments: Patient appears uncomfortable  HENT:     Head: Atraumatic.  Cardiovascular:     Rate and Rhythm: Normal rate and regular rhythm.  Pulmonary:     Breath sounds: No wheezing, rhonchi or rales.  Chest:     Chest wall: Tenderness present.     Comments: Mild tenderness to anterior lower chest wall. Abdominal:     Tenderness: There is no abdominal tenderness.  Musculoskeletal:     Cervical back: Neck supple.     Right lower leg: No edema.     Left lower leg: No edema.  Skin:    General: Skin is warm.     Capillary Refill: Capillary refill takes less than 2 seconds.  Neurological:     Mental Status: She is alert and oriented to person, place, and time.     ED Results / Procedures / Treatments   Labs (all labs ordered are listed, but only abnormal results are displayed) Labs Reviewed  D-DIMER, QUANTITATIVE (NOT AT [facility]) - Abnormal; Notable for the following components:      Result Value   D-Dimer, Quant 1.75 (*)     All other components within normal limits  CBC WITH DIFFERENTIAL/PLATELET - Abnormal; Notable for the following components:   MCV 103.4 (*)    MCH 35.9 (*)    Neutro Abs 9.4 (*)    Lymphs Abs 0.5 (*)    All other components within normal limits  COMPREHENSIVE METABOLIC PANEL - Abnormal; Notable for the  following components:   CO2 21 (*)    Glucose, Bld 151 (*)    AST 75 (*)    Total Bilirubin 2.2 (*)    Anion gap 16 (*)    All other components within normal limits  LIPASE, BLOOD - Abnormal; Notable for the following components:   Lipase 654 (*)    All other components within normal limits  TROPONIN I (HIGH SENSITIVITY) - Abnormal; Notable for the following components:   Troponin I (High Sensitivity) 27 (*)    All other components within normal limits  TROPONIN I (HIGH SENSITIVITY) - Abnormal; Notable for the following components:   Troponin I (High Sensitivity) 35 (*)    All other components within normal limits  SARS CORONAVIRUS 2 BY RT PCR [facility] ORDER, [place] [facility])    EKG EKG Interpretation  Date/Time:  Monday July 15 2020 07:54:24 EDT Ventricular Rate:  93 PR Interval:    QRS Duration: 93 QT Interval:  368 QTC Calculation: 458 R Axis:   20 Text Interpretation: Sinus rhythm Biatrial enlargement Anteroseptal infarct, age indeterminate 12 Lead; [place]-Lik[place] nonspecific ST changes. Potential elevation in AVR and V1 Confirmed by Davonna Belling [phone]) on 07/15/2020 8:03:37 AM   Radiology CT Angio Chest PE W and/or Wo Contrast  Result Date: 07/15/2020 CLINICAL DATA:  Chest pain since last evening. Positive D-dimer. EXAM: CT ANGIOGRAPHY CHEST WITH CONTRAST TECHNIQUE: Multidetector CT imaging of the chest was performed using the standard protocol during bolus administration of intravenous contrast. Multiplanar CT image reconstructions and MIPs were obtained to evaluate the vascular anatomy. CONTRAST:  50mL OMNIPAQUE IOHEXOL 350 MG/ML SOLN  COMPARISON:  09/23/2018 FINDINGS: Cardiovascular: The heart is normal in size. No pericardial effusion. Mild tortuosity of the thoracic aorta but no aneurysm or dissection. The branch vessels are patent. No definite coronary artery calcifications. The pulmonary arterial tree is well opacified. No filling defects to suggest pulmonary embolism. Mediastinum/Nodes: No mediastinal or hilar mass or adenopathy. The esophagus is grossly normal. The thyroid gland is unremarkable. Lungs/Pleura: The lungs are clear of an acute process. No pulmonary lesions or pleural effusions. Upper Abdomen: There appears to be some inflammatory type changes around the pancreatic tail and near the spleen. There is a small amount of fluid in the left anterior pararenal space. Recommend correlation with any symptoms of pancreatitis. Correlation with amylase level may be helpful. Musculoskeletal: No breast masses, supraclavicular or axillary adenopathy. No significant bony findings. Review of the MIP images confirms the above findings. IMPRESSION: 1. No CT findings for pulmonary embolism. 2. Normal thoracic aorta. 3. No acute pulmonary findings. 4. Inflammatory type changes around the pancreatic tail and near the spleen. Recommend correlation with any symptoms of pancreatitis. Correlation with amylase level may be helpful. Electronically Signed   By: Marijo Sanes M.D.   On: 07/15/2020 12:11   DG Chest Portable 1 View  Result Date: 07/15/2020 CLINICAL DATA:  Chest pain. EXAM: PORTABLE CHEST 1 VIEW COMPARISON:  10/20/2011 FINDINGS: The heart size and mediastinal contours are within normal limits. Stable bilateral pulmonary hyperinflation and emphysematous disease. There is no evidence of pulmonary edema, consolidation, pneumothorax, nodule or pleural fluid. The visualized skeletal structures are unremarkable. IMPRESSION: Stable emphysematous lung disease. Electronically Signed   By: Aletta Edouard M.D.   On: 07/15/2020 08:33     Procedures Procedures (including critical care time)  Medications Ordered in ED Medications  ondansetron (ZOFRAN) injection 4 mg (4 mg Intravenous Given 07/15/20 1111)  nitroGLYCERIN (NITROSTAT) SL tablet  0.4 mg (0.4 mg Sublingual Given 07/15/20 1103)  iohexol (OMNIPAQUE) 350 MG/ML injection 100 mL (75 mLs Intravenous Contrast Given 07/15/20 1138)    ED Course  I have reviewed the triage vital signs and the nursing notes.  Pertinent labs & imaging results that were available during my care of the patient were reviewed by me and considered in my medical decision making (see chart for details).    MDM Rules/Calculators/A&P                          Patient with chest pain nausea and vomiting.  Has had for the last 2 days.  Troponin mildly elevated but overall grossly stable.  EKG nonspecific changes.  However D-dimer elevated.  CT scan done and showed pancreatitis.  Lipase done after this and elevated.  Patient states she only drinks occasionally.  States she is "small" however.  Discussed with Dr. Loletha Grayer from cardiology.  Does not think that patient needs anticoagulation at this time.  With pancreatitis nausea vomiting abdominal pain along with mildly elevated troponin will admit to hospitalist.  Will need further work-up of the pancreatitis and fluids and pain control. Final Clinical Impression(s) / ED Diagnoses Final diagnoses:  Acute pancreatitis, unspecified complication status, unspecified pancreatitis type  Chest pain, unspecified type    Rx / DC Orders ED Discharge Orders    None       Davonna Belling, MD 07/15/20 1347

## 2020-07-15 NOTE — H&P (Signed)
History and Physical    [name] GYI:[id number] DOB: December 25, 1960 DOA: 07/15/2020  PCP: [name], NP   Patient coming from: Home  I have personally briefly reviewed patient's old medical records in [place]  Chief Complaint: Chest pain  HPI: Amber Roberts is a 59 y.o. female with medical history significant for cervical cancer, GERD, fibromyalgia who presents to the ER for evaluation of chest pain which started about 4am in the morning 24 hours prior to her presentation.  Chest pain was mostly over her anterior chest wall and lower sternum and associated with nausea and vomiting of bilious material.  The patient her pain initially started in her back and radiated to the front part of the lower portion of her chest. She also complains of abdominal pain mostly in the epigastrium and rates her pain a 6 x 10 in intensity at its worst.  She denies having any changes in her bowel habits She denies having any shortness of breath, no fever, no chills, no dizziness or lightheadedness. Patient noted to have an elevated D-dimer as well as mild elevated troponin and had a CT angiogram done which showed pancreatitis.  She also has an elevated lipase level Labs show sodium 135, potassium 3.8, chloride 98, bicarb 21, glucose 151, BUN 7, creatinine 0.61, calcium 9.6, alkaline phosphatase 111, albumin 4.7, lipase 654, AST 75, ALT 25, troponin 27 >> 35 White count 10.4, hemoglobin 14.8, hematocrit 42.6, MCV 100.4, RDW 14.8, platelet count 156 CT angiogram of the chest shows no CT findings for pulmonary embolism but inflammatory changes around the pancreatic tail and near the spleen, recommend correlation with any symptoms of pancreatitis. Chest x-ray reviewed by me shows hyperinflated lungs.  Chronic changes. Twelve-lead EKG showed nonspecific ST elevation, AVR and VI    ED Course: Patient is a 59 year old female presents for evaluation of chest and abdominal pain and is found to  have acute pancreatitis.  She admits to alcohol use.  She will be admitted to the hospital for further evaluation.  Review of Systems: As per HPI otherwise 10 point review of systems negative.    Past Medical History:  Diagnosis Date  . Abnormal Pap smear of vagina    abnl paps of vaginal cuff; last pap 11/2003  . Cervical cancer ([place]) early 20's  . Chest pain   . Fibromyalgia   . GERD (gastroesophageal reflux disease)   . Hyperlipidemia   . MI (myocardial infarction) ([place])   . Osteopenia   . Premature menopause 37-38  . Seasonal allergies    now mostly yearlong  . Seizure ([place]) 2010   per pt, related to lowered seizure threshold from imipramine and tramadol)--saw Dr. Jannifer Franklin  . Smoker     Past Surgical History:  Procedure Laterality Date  . surgery for cervical cancer    . VAGINAL HYSTERECTOMY     fibroids  . WRIST SURGERY     deQuervains     reports that she has been smoking cigarettes. She has a 41.00 pack-year smoking history. She has never used smokeless tobacco. She reports current alcohol use of about 14.0 standard drinks of alcohol per week. She reports that she does not use drugs.  Allergies  Allergen Reactions  . Darvon [Propoxyphene Hcl] Nausea And Vomiting    Family History  Problem Relation Age of Onset  . Scoliosis Mother   . Cancer Father 31       testicular cancer  . Autism Son   . Cancer Maternal Uncle  esophageal (smoker)  . Macular degeneration Maternal Grandmother        wet  . Hypertension Maternal Grandmother   . Hyperlipidemia Maternal Grandmother   . Depression Maternal Grandmother        related to age, loss of vision  . Colitis Maternal Grandmother   . Breast cancer Maternal Grandmother   . Breast cancer Paternal Grandfather   . Diabetes Neg Hx      Prior to Admission medications   Medication Sig Start Date End Date Taking? Authorizing Provider  Vitamin D, Ergocalciferol, (DRISDOL) 1.25 MG (50000 UNIT) CAPS capsule Take 1  capsule by mouth once weekly. 06/01/20  Yes [name], NP  amoxicillin-clavulanate (AUGMENTIN) 875-125 MG tablet Take 1 tablet by mouth 2 (two) times daily. Patient not taking: Reported on 07/15/2020 06/28/20   Viviana Simpler I, MD  lansoprazole (PREVACID) 15 MG capsule Take 15 mg by mouth daily.  05/19/20   [provider]  mirtazapine (REMERON) 15 MG tablet Take 0.5-1 tablets (7.5-15 mg total) by mouth at bedtime. For sleep and appetite. Patient not taking: Reported on 07/15/2020 05/31/20   [name], NP    Physical Exam: Vitals:   07/15/20 1100 07/15/20 1130 07/15/20 1230 07/15/20 1330  BP: (!) 165/103 (!) 156/90 (!) 157/99 (!) 165/90  Pulse: 80 81 82 82  Resp: 18 13 13 17   Temp:      TempSrc:      SpO2: 99% 98% 99% 97%  Weight:      Height:         Vitals:   07/15/20 1100 07/15/20 1130 07/15/20 1230 07/15/20 1330  BP: (!) 165/103 (!) 156/90 (!) 157/99 (!) 165/90  Pulse: 80 81 82 82  Resp: 18 13 13 17   Temp:      TempSrc:      SpO2: 99% 98% 99% 97%  Weight:      Height:        Constitutional: NAD, alert and oriented x 3 Eyes: PERRL, lids and conjunctivae normal ENMT: Mucous membranes are moist.  Neck: normal, supple, no masses, no thyromegaly Respiratory: clear to auscultation bilaterally, no wheezing, no crackles. Normal respiratory effort. No accessory muscle use.  Cardiovascular: Regular rate and rhythm,no murmurs / rubs / gallops. No extremity edema. 2+ pedal pulses. No carotid bruits.  Abdomen: Epigastric tenderness, no masses palpated. No hepatosplenomegaly. Bowel sounds positive.  Musculoskeletal: no clubbing / cyanosis. No joint deformity upper and lower extremities.  Skin: no rashes, lesions, ulcers.  Neurologic: No gross focal neurologic deficit. Psychiatric: Normal mood and affect.   Labs on Admission: I have personally reviewed following labs and imaging studies  CBC: Recent Labs  Lab 07/15/20 0852  WBC 10.4  NEUTROABS 9.4*   HGB 14.8  HCT 42.6  MCV 103.4*  PLT 643   Basic Metabolic Panel: Recent Labs  Lab 07/15/20 0852  NA 135  K 3.8  CL 98  CO2 21*  GLUCOSE 151*  BUN 7  CREATININE 0.60  CALCIUM 9.6   GFR: Estimated Creatinine Clearance: 58.3 mL/min (by C-G formula based on SCr of 0.6 mg/dL). Liver Function Tests: Recent Labs  Lab 07/15/20 0852  AST 75*  ALT 25  ALKPHOS 111  BILITOT 2.2*  PROT 8.0  ALBUMIN 4.7   Recent Labs  Lab 07/15/20 1111  LIPASE 654*   No results for input(s): AMMONIA in the last 168 hours. Coagulation Profile: No results for input(s): INR, PROTIME in the last 168 hours. Cardiac Enzymes: No results  for input(s): CKTOTAL, CKMB, CKMBINDEX, TROPONINI in the last 168 hours. BNP (last 3 results) No results for input(s): PROBNP in the last 8760 hours. HbA1C: No results for input(s): HGBA1C in the last 72 hours. CBG: No results for input(s): GLUCAP in the last 168 hours. Lipid Profile: No results for input(s): CHOL, HDL, LDLCALC, TRIG, CHOLHDL, LDLDIRECT in the last 72 hours. Thyroid Function Tests: No results for input(s): TSH, T4TOTAL, FREET4, T3FREE, THYROIDAB in the last 72 hours. Anemia Panel: No results for input(s): VITAMINB12, FOLATE, FERRITIN, TIBC, IRON, RETICCTPCT in the last 72 hours. Urine analysis:    Component Value Date/Time   BILIRUBINUR Negative 05/31/2020 1000   PROTEINUR Negative 05/31/2020 1000   UROBILINOGEN 0.2 05/31/2020 1000   NITRITE Negative 05/31/2020 1000   LEUKOCYTESUR Negative 05/31/2020 1000    Radiological Exams on Admission: CT Angio Chest PE W and/or Wo Contrast  Result Date: 07/15/2020 CLINICAL DATA:  Chest pain since last evening. Positive D-dimer. EXAM: CT ANGIOGRAPHY CHEST WITH CONTRAST TECHNIQUE: Multidetector CT imaging of the chest was performed using the standard protocol during bolus administration of intravenous contrast. Multiplanar CT image reconstructions and MIPs were obtained to evaluate the vascular  anatomy. CONTRAST:  65mL OMNIPAQUE IOHEXOL 350 MG/ML SOLN COMPARISON:  09/23/2018 FINDINGS: Cardiovascular: The heart is normal in size. No pericardial effusion. Mild tortuosity of the thoracic aorta but no aneurysm or dissection. The branch vessels are patent. No definite coronary artery calcifications. The pulmonary arterial tree is well opacified. No filling defects to suggest pulmonary embolism. Mediastinum/Nodes: No mediastinal or hilar mass or adenopathy. The esophagus is grossly normal. The thyroid gland is unremarkable. Lungs/Pleura: The lungs are clear of an acute process. No pulmonary lesions or pleural effusions. Upper Abdomen: There appears to be some inflammatory type changes around the pancreatic tail and near the spleen. There is a small amount of fluid in the left anterior pararenal space. Recommend correlation with any symptoms of pancreatitis. Correlation with amylase level may be helpful. Musculoskeletal: No breast masses, supraclavicular or axillary adenopathy. No significant bony findings. Review of the MIP images confirms the above findings. IMPRESSION: 1. No CT findings for pulmonary embolism. 2. Normal thoracic aorta. 3. No acute pulmonary findings. 4. Inflammatory type changes around the pancreatic tail and near the spleen. Recommend correlation with any symptoms of pancreatitis. Correlation with amylase level may be helpful. Electronically Signed   By: Marijo Sanes M.D.   On: 07/15/2020 12:11   DG Chest Portable 1 View  Result Date: 07/15/2020 CLINICAL DATA:  Chest pain. EXAM: PORTABLE CHEST 1 VIEW COMPARISON:  10/20/2011 FINDINGS: The heart size and mediastinal contours are within normal limits. Stable bilateral pulmonary hyperinflation and emphysematous disease. There is no evidence of pulmonary edema, consolidation, pneumothorax, nodule or pleural fluid. The visualized skeletal structures are unremarkable. IMPRESSION: Stable emphysematous lung disease. Electronically Signed   By:  Aletta Edouard M.D.   On: 07/15/2020 08:33    EKG: Independently reviewed.  Sinus rhythm Nonspecific ST elevation  Assessment/Plan Principal Problem:   Acute pancreatitis Active Problems:   GERD (gastroesophageal reflux disease)   CAD (coronary artery disease)   Nicotine dependence     Acute pancreatitis Most likely secondary to alcohol use Keep patient n.p.o. Supportive care with IV fluid hydration, IV PPI pain control and antiemetics   Nicotine dependence Smoking cessation has been discussed with patient She declines a nicotine transdermal patch   Alcohol dependence Monitor patient closely for signs and symptoms of alcohol withdrawal Administer lorazepam for CIWA score of  8 or greater   Coronary artery disease Patient presents with complaint of chest pain and noted to have an abnormal EKG with slightly elevated troponin levels Chest pain may be atypical and related to her known history of GERD Cardiology has been consulted Continue low-dose aspirin       DVT prophylaxis: Lovenox Code Status: Full Family Communication: Greater than 50% of time was spent discussing plan of care with patient at the bedside.  All questions and concerns have been addressed.  She verbalizes understanding and agrees with the plan Disposition Plan: Back to previous home environment Consults called: None    Shishir Krantz MD [facility]     07/15/2020, 2:42 PM

## 2020-07-15 NOTE — ED Triage Notes (Signed)
Patient c/o mid chest pain since last night. Patient states she has been when she takes a deep breath. Patient also c/o emesis since around midnight.

## 2020-07-15 NOTE — ED Notes (Signed)
Patient transported to CT 

## 2020-07-15 NOTE — ED Notes (Signed)
Pt reports decrease in chest pain after taking Nitro.  Refusing additional Nitro.

## 2020-07-15 NOTE — ED Notes (Signed)
Pt c/o chest pain since 0400 Sunday morning and n/v since midnight.  Currently, c/o generalized abdominal pain d/t excessive vomiting.

## 2020-07-15 NOTE — Plan of Care (Signed)

## 2020-07-16 ENCOUNTER — Inpatient Hospital Stay (HOSPITAL_COMMUNITY): Payer: 59

## 2020-07-16 DIAGNOSIS — D696 Thrombocytopenia, unspecified: Secondary | ICD-10-CM

## 2020-07-16 DIAGNOSIS — K859 Acute pancreatitis without necrosis or infection, unspecified: Secondary | ICD-10-CM

## 2020-07-16 DIAGNOSIS — R778 Other specified abnormalities of plasma proteins: Secondary | ICD-10-CM

## 2020-07-16 DIAGNOSIS — E871 Hypo-osmolality and hyponatremia: Secondary | ICD-10-CM

## 2020-07-16 DIAGNOSIS — R079 Chest pain, unspecified: Secondary | ICD-10-CM

## 2020-07-16 LAB — BASIC METABOLIC PANEL
Anion gap: 8 (ref 5–15)
BUN: 7 mg/dL (ref 6–20)
CO2: 25 mmol/L (ref 22–32)
Calcium: 8.2 mg/dL — ABNORMAL LOW (ref 8.9–10.3)
Chloride: 95 mmol/L — ABNORMAL LOW (ref 98–111)
Creatinine, Ser: 0.42 mg/dL — ABNORMAL LOW (ref 0.44–1.00)
GFR calc Af Amer: >60 mL/min (ref >60)
GFR calc non Af Amer: >60 mL/min (ref >60)
Glucose, Bld: 96 mg/dL (ref 70–99)
Potassium: 3.7 mmol/L (ref 3.5–5.1)
Sodium: 128 mmol/L — ABNORMAL LOW (ref 135–145)

## 2020-07-16 LAB — CBC
HCT: 34.6 % — ABNORMAL LOW (ref 36.0–46.0)
Hemoglobin: 12 g/dL (ref 12.0–15.0)
MCH: 36.4 pg — ABNORMAL HIGH (ref 26.0–34.0)
MCHC: 34.7 g/dL (ref 30.0–36.0)
MCV: 104.8 fL — ABNORMAL HIGH (ref 80.0–100.0)
Platelets: 124 10*3/uL — ABNORMAL LOW (ref 150–400)
RBC: 3.3 MIL/uL — ABNORMAL LOW (ref 3.87–5.11)
RDW: 14.6 % (ref 11.5–15.5)
WBC: 7.4 10*3/uL (ref 4.0–10.5)
nRBC: 0 % (ref 0.0–0.2)

## 2020-07-16 LAB — HEPATIC FUNCTION PANEL
ALT: 16 U/L (ref 0–44)
AST: 35 U/L (ref 15–41)
Albumin: 3.4 g/dL — ABNORMAL LOW (ref 3.5–5.0)
Alkaline Phosphatase: 72 U/L (ref 38–126)
Bilirubin, Direct: 0.3 mg/dL — ABNORMAL HIGH (ref 0.0–0.2)
Indirect Bilirubin: 1.4 mg/dL — ABNORMAL HIGH (ref 0.3–0.9)
Total Bilirubin: 1.7 mg/dL — ABNORMAL HIGH (ref 0.3–1.2)
Total Protein: 6 g/dL — ABNORMAL LOW (ref 6.5–8.1)

## 2020-07-16 MED ORDER — SODIUM CHLORIDE 0.9 % IV SOLN: INTRAVENOUS | Status: DC

## 2020-07-16 MED ORDER — METOPROLOL TARTRATE 25 MG PO TABS
12.5000 mg | ORAL_TABLET | Freq: Two times a day (BID) | ORAL | Status: DC
  Administered 2020-07-16 – 2020-07-20 (×9): 12.5 mg via ORAL
  Filled 2020-07-16 (×9): qty 1

## 2020-07-16 NOTE — Consult Note (Addendum)
Cardiology Consultation:   Patient ID: Amber Roberts; [id number]; 1960-12-07   Admit date: 07/15/2020 Date of Consult: 07/16/2020  Primary Care Provider: [name], NP Primary Cardiologist: Dr. Percival Spanish, MD  Patient Profile:   Amber Roberts is a 59 y.o. female with a hx of cervical cancer, GERD, ongoing tobacco use, hyperlipidemia not on medication, chronic back pain, fibromyalgia and known abnormal EKG who is being seen today for the evaluation of chest pain at the request of Dr. Starla Link.  History of Present Illness:   Amber Roberts is a 59 year old female with a history stated above who presented to [facility] 07/15/2020 with chest pain which initially began 1 day prior to ED presentation. Pt reports that she woke early Sunday morning about 4 am with persistent anterior chest pain/pressure with no associated diaphoresis, nausea or vomiting. She initially felt that the pain was secondary to her chronic back pain as she has persistent cervical and thoracic pain due to bone spurs. Unfortunately, her pain continued and eventually went to her lower abdomen. At that time, she presented to the ED for further evaluation. She denies prior hx of angina. She states that she had an MI while living in [facility] due to stress however never underwent cardiac catheterization. She has no exertional qualities. She has some knowledge of cardiology as she assisted with nuclear stress tests while in [place]. She continues to smoke and does state she drinks on occasion.   On ED presentation, D-dimer found to be elevated along with mildly elevated troponin at which time chest CTA was performed that was negative for PE however showed presumed pancreatitis. Lipase levels were elevated at 654 with AST/ALT levels at 75/25. hsT at 27>>> 35. CXR with hyperinflated lungs consistent with chronic changes. She was admitted to hospitalist service for acute pancreatitis with unknown etiology. She was treated with IV fluid hydration  along with pain management and antiemetics. Cardiology was consulted for chest pain symptoms.  She has previously been seen by Dr. Percival Spanish, initially referred 08/11/2017 for the evaluation of reported CAD and tachycardia.  At the time of his evaluation, there was mention of CAD in chart review however she had never undergone a cardiac catheterization for definitive evaluation.  She reported an [place] and was [place] by Dr. Mare Ferrari at which time patient reported a negative stress test however these records are not in our system. She underwent an exercise treadmill study which was found to be normal. EKG showed anterior Q waves however patient reported those have been present for years.  She has not been seen since this time.  Past Medical History:  Diagnosis Date  . Abnormal Pap smear of vagina    abnl paps of vaginal cuff; last pap 11/2003  . Cervical cancer ([place]) early 20's  . Chest pain   . Fibromyalgia   . GERD (gastroesophageal reflux disease)   . Hyperlipidemia   . MI (myocardial infarction) ([place])   . Osteopenia   . Premature menopause [age]  . Seasonal allergies    now mostly yearlong  . Seizure ([place]) 2010   per pt, related to lowered seizure threshold from imipramine and tramadol)--saw Dr. Jannifer Franklin  . Smoker     Past Surgical History:  Procedure Laterality Date  . surgery for cervical cancer    . VAGINAL HYSTERECTOMY     fibroids  . WRIST SURGERY     deQuervains     Prior to Admission medications   Medication Sig Start  Date End Date Taking? Authorizing Provider  Vitamin D, Ergocalciferol, (DRISDOL) 1.25 MG (50000 UNIT) CAPS capsule Take 1 capsule by mouth once weekly. 06/01/20  Yes [name], NP  amoxicillin-clavulanate (AUGMENTIN) 875-125 MG tablet Take 1 tablet by mouth 2 (two) times daily. Patient not taking: Reported on 07/15/2020 06/28/20   Viviana Simpler I, MD  lansoprazole (PREVACID) 15 MG capsule Take 15 mg by mouth daily.   05/19/20   [provider]  mirtazapine (REMERON) 15 MG tablet Take 0.5-1 tablets (7.5-15 mg total) by mouth at bedtime. For sleep and appetite. Patient not taking: Reported on 07/15/2020 05/31/20   [name], NP    Inpatient Medications: Scheduled Meds: . enoxaparin (LOVENOX) injection  40 mg Subcutaneous Q24H  . pantoprazole (PROTONIX) IV  40 mg Intravenous Q24H   Continuous Infusions: . sodium chloride 125 mL/hr at 07/16/20 1030   PRN Meds: HYDROmorphone (DILAUDID) injection, LORazepam **OR** LORazepam, ondansetron **OR** ondansetron (ZOFRAN) IV  Allergies:    Allergies  Allergen Reactions  . Darvon [Propoxyphene Hcl] Nausea And Vomiting    Social History:   Social History   Socioeconomic History  . Marital status: Single    Spouse name: Not on file  . Number of children: 1  . Years of education: Not on file  . Highest education level: Not on file  Occupational History  . 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 Smoking status: Current Every Day Smoker    Packs/day: 1.00    Years: 41.00    Pack years: 41.00    Types: Cigarettes  . Smokeless tobacco: Never Used  Vaping Use  . Vaping Use: Never used  Substance and Sexual Activity  . Alcohol use: Yes    Alcohol/week: 14.0 standard drinks    Types: 14 Cans of beer per week    Comment: 2-3 beers per day.  . Drug use: No  . Sexual activity: Not Currently  Other Topics Concern  . Not on file  Social History Narrative   Lives at home with 71 year old autistic son.  5 cats, 1 dog.     Social Determinants of Health   Financial Resource Strain:   . Difficulty of Paying Living Expenses: Not on file  Food Insecurity:   . Worried About Charity fundraiser in the Last Year: Not on file  . Ran Out of Food in the Last Year: Not on file  Transportation Needs:   . Lack of Transportation (Medical): Not on file  . Lack of Transportation (Non-Medical): Not on file  Physical Activity:    . Days of Exercise per Week: Not on file  . Minutes of Exercise per Session: Not on file  Stress:   . Feeling of Stress : Not on file  Social Connections:   . Frequency of Communication with Friends and Family: Not on file  . Frequency of Social Gatherings with Friends and Family: Not on file  . Attends Religious Services: Not on file  . Active Member of Clubs or Organizations: Not on file  . Attends Archivist Meetings: Not on file  . Marital Status: Not on file  Intimate Partner Violence:   . Fear of Current or Ex-Partner: Not on file  . Emotionally Abused: Not on file  . Physically Abused: Not on file  . Sexually Abused: Not on file    Family History:   Family History  Problem Relation Age of Onset  . Scoliosis Mother   .  Cancer Father 31       testicular cancer  . Autism Son   . Cancer Maternal Uncle        esophageal (smoker)  . Macular degeneration Maternal Grandmother        wet  . Hypertension Maternal Grandmother   . Hyperlipidemia Maternal Grandmother   . Depression Maternal Grandmother        related to age, loss of vision  . Colitis Maternal Grandmother   . Breast cancer Maternal Grandmother   . Breast cancer Paternal Grandfather   . Diabetes Neg Hx    Family Status:  Family Status  Relation Name Status  . Mother  Alive  . Father  Deceased at age 13       drowning, fishing accident  . Son  Alive  . Mat Uncle  (Not Specified)  . MGM  Deceased  . MGF  Deceased  . PGM  Deceased  . PGF  Deceased  . Neg Hx  (Not Specified)    ROS:  Please see the history of present illness.  All other ROS reviewed and negative.     Physical Exam/Data:   Vitals:   07/15/20 2219 07/16/20 0211 07/16/20 0627 07/16/20 0940  BP: 134/77 126/82 132/83 132/82  Pulse: 72 77 74 90  Resp: 18 18 18 14   Temp: 98.4 F (36.9 C) 98.2 F (36.8 C) 98.4 F (36.9 C) 98.4 F (36.9 C)  TempSrc: Oral Oral Oral Oral  SpO2: 99% 99% 99% 98%  Weight:      Height:         Intake/Output Summary (Last 24 hours) at 07/16/2020 1253 Last data filed at 07/16/2020 0200 Gross per 24 hour  Intake 1383.41 ml  Output --  Net 1383.41 ml   Filed Weights   07/15/20 0755 07/15/20 2200  Weight: 47.6 kg 47.4 kg   Body mass index is 16.61 kg/m.   General: Well developed, well nourished, NAD Neck: Negative for carotid bruits. No JVD Lungs:Clear to ausculation bilaterally. No wheezes, rales, or rhonchi. Breathing is unlabored. Cardiovascular: RRR with S1 S2. No murmurs Abdomen: Soft, tender to palpation, non-distended. No obvious abdominal masses. Extremities: No edema. Radial pulses 2+ bilaterally Neuro: Alert and oriented. No focal deficits. No facial asymmetry. MAE spontaneously. Psych: Responds to questions appropriately with normal affect.    EKG:  The EKG was personally reviewed and demonstrates:  07/15/20 NSR with Q waves in lead V3 with no acute ST segment changes. T Waves appear to be hyperacute  Telemetry:  Telemetry was personally reviewed and demonstrates: 07/16/20 NSR with rates in the 60-80 range   Relevant CV Studies:  Exercise tolerance test 08/20/2017:   Blood pressure demonstrated a normal response to exercise.  There was no ST segment deviation noted during stress.   ETT with fair exercise tolerance (7:00); no chest pain; normal BP response; no ST changes; negative adequate ETT; Duke treadmill score 7.  Laboratory Data:  Chemistry Recent Labs  Lab 07/15/20 0852 07/16/20 0432  NA 135 128*  K 3.8 3.7  CL 98 95*  CO2 21* 25  GLUCOSE 151* 96  BUN 7 7  CREATININE 0.60 0.42*  CALCIUM 9.6 8.2*  GFRNONAA >60 >60  GFRAA >60 >60  ANIONGAP 16* 8    Total Protein  Date Value Ref Range Status  07/16/2020 6.0 (L) 6.5 - 8.1 g/dL Final   Albumin  Date Value Ref Range Status  07/16/2020 3.4 (L) 3.5 - 5.0 g/dL Final   AST  Date Value Ref Range Status  07/16/2020 35 15 - 41 U/L Final   ALT  Date Value Ref Range Status  07/16/2020  16 0 - 44 U/L Final   Alkaline Phosphatase  Date Value Ref Range Status  07/16/2020 72 38 - 126 U/L Final   Total Bilirubin  Date Value Ref Range Status  07/16/2020 1.7 (H) 0.3 - 1.2 mg/dL Final   Hematology Recent Labs  Lab 07/15/20 0852 07/16/20 0432  WBC 10.4 7.4  RBC 4.12 3.30*  HGB 14.8 12.0  HCT 42.6 34.6*  MCV 103.4* 104.8*  MCH 35.9* 36.4*  MCHC 34.7 34.7  RDW 14.8 14.6  PLT 156 124*   Cardiac EnzymesNo results for input(s): TROPONINI in the last 168 hours. No results for input(s): TROPIPOC in the last 168 hours.  BNPNo results for input(s): BNP, PROBNP in the last 168 hours.  DDimer  Recent Labs  Lab 07/15/20 0852  DDIMER 1.75*   TSH:  Lab Results  Component Value Date   TSH 1.17 05/31/2020   Lipids: Lab Results  Component Value Date   CHOL 183 05/31/2020   HDL 85.00 05/31/2020   LDLCALC 64 05/31/2020   LDLDIRECT 114.0 08/26/2018   TRIG 171.0 (H) 05/31/2020   CHOLHDL 2 05/31/2020   HgbA1c: Lab Results  Component Value Date   HGBA1C 5.3 07/09/2017    Radiology/Studies:  CT Angio Chest PE W and/or Wo Contrast  Result Date: 07/15/2020 CLINICAL DATA:  Chest pain since last evening. Positive D-dimer. EXAM: CT ANGIOGRAPHY CHEST WITH CONTRAST TECHNIQUE: Multidetector CT imaging of the chest was performed using the standard protocol during bolus administration of intravenous contrast. Multiplanar CT image reconstructions and MIPs were obtained to evaluate the vascular anatomy. CONTRAST:  12mL OMNIPAQUE IOHEXOL 350 MG/ML SOLN COMPARISON:  09/23/2018 FINDINGS: Cardiovascular: The heart is normal in size. No pericardial effusion. Mild tortuosity of the thoracic aorta but no aneurysm or dissection. The branch vessels are patent. No definite coronary artery calcifications. The pulmonary arterial tree is well opacified. No filling defects to suggest pulmonary embolism. Mediastinum/Nodes: No mediastinal or hilar mass or adenopathy. The esophagus is grossly normal.  The thyroid gland is unremarkable. Lungs/Pleura: The lungs are clear of an acute process. No pulmonary lesions or pleural effusions. Upper Abdomen: There appears to be some inflammatory type changes around the pancreatic tail and near the spleen. There is a small amount of fluid in the left anterior pararenal space. Recommend correlation with any symptoms of pancreatitis. Correlation with amylase level may be helpful. Musculoskeletal: No breast masses, supraclavicular or axillary adenopathy. No significant bony findings. Review of the MIP images confirms the above findings. IMPRESSION: 1. No CT findings for pulmonary embolism. 2. Normal thoracic aorta. 3. No acute pulmonary findings. 4. Inflammatory type changes around the pancreatic tail and near the spleen. Recommend correlation with any symptoms of pancreatitis. Correlation with amylase level may be helpful. Electronically Signed   By: Marijo Sanes M.D.   On: 07/15/2020 12:11   DG Chest Portable 1 View  Result Date: 07/15/2020 CLINICAL DATA:  Chest pain. EXAM: PORTABLE CHEST 1 VIEW COMPARISON:  10/20/2011 FINDINGS: The heart size and mediastinal contours are within normal limits. Stable bilateral pulmonary hyperinflation and emphysematous disease. There is no evidence of pulmonary edema, consolidation, pneumothorax, nodule or pleural fluid. The visualized skeletal structures are unremarkable. IMPRESSION: Stable emphysematous lung disease. Electronically Signed   By: Aletta Edouard M.D.   On: 07/15/2020 08:33   Assessment and Plan:   1.  Chest  pain: -Patient has a reported history of CAD per chart review however has never undergone cardiac catheterization.  She was initially referred to Dr. Percival Spanish in 2018 for the evaluation of palpitations at which time she underwent an exercise treadmill study which was found to be normal.  EKG during that time showed anterior Q waves which patient reported as present for many years.  She presented to [facility] with  anterior chest pain.  EKG performed which showed persistent anterior Q waves, similar to prior tracing from 07/09/2017.  There were no acute ST changes. hsT levels drawn which were mildly elevated at 27>>35.  Abdominal imaging on presentation consistent with acute pancreatitis of unknown etiology.  AST/ALT levels elevated at 75/25 with a lipase at 654 -Would recommend one additional hsT to follow peak and potentially obtain echo. May be beneficial to obtain OP coronary CT for more definitive evaluation if persistent symptoms after treatment of acute illness.  -CV risk factors include tobacco use  -Reassured given stable exercise tolerance test in 2018 -Likely pain radiation secondary to acute pancreatitis -Would recommend adding metoprolol 12.5mg  PO BID   2.  Acute pancreatitis: -Unknown etiology per primary team notes -Plan for right upper quadrant ultrasound>> CT on presentation with inflammatory changes around the pancreatic tail -Lipase elevated at 654 -Plan for IV fluid hydration with pain management and antiemetics as needed  3.  Dyslipidemia: -Plan for lipid panel on AM labs  4.  Hypertension: -Stable, 131/78>>132/82>>132/83 -Not currently on OP antihypertensive therapy   5.  Tobacco use: -Cessation strongly encouraged  Other issues per primary team include: -Thrombocytopenia -Hyponatremia -Microcytosis   For questions or updates, please contact [place] Please consult [email or web address] for contact info under Cardiology/STEMI.   SignedKathyrn Drown NP-C [place] Pager: [phone] 07/16/2020 12:53 PM  I have seen and examined the patient along with Kathyrn Drown NP-C.  I have reviewed the chart, notes and new data.  I agree with PA/NP's note.  Key new complaints: chest symptoms are atypical and readily explained by acute pancreatitis Key examination changes: CV exam is normal Key new findings / data: ECG changes are not those of an acute coronary event and are  stable for many years. The troponin I elevation is very mild, readily explained by severe noncardiac illness. Normal treadmill stress test 2 years ago. No coronary artery calcification is seen on chest CT during this admission. Suspicion for meaningful CAD is low.  PLAN: No plan for additional coronary evaluation at this time. Identify etiology of acute pancreatitis and treat accordingly. Echo is reasonable since all the ECGs show surprisingly high P wave voltages, possible biatrial enlargement and incomplete RBBB (consider ASD). It is not urgent and is not likely to shed a lot of light on the current acute illness.  Sanda Klein, MD, [place] [phone] 07/16/2020, 2:52 PM

## 2020-07-16 NOTE — Progress Notes (Signed)
Patient ID: Amber Roberts, female   DOB: Mar 01, 1961, 59 y.o.   MRN: [id number]  PROGRESS NOTE    Keirston Saephanh  HEN:[id number] DOB: [birth year] DOA: 07/15/2020 PCP: [name], NP   Brief Narrative:  59 year old female with history of cervical cancer, gout, fibromyalgia presented with chest pain/upper abdominal pain with nausea and vomiting.  On presentation, she had elevated D-dimer along with mildly elevated troponin.  CT angiogram showed no findings of pulmonary embolism but inflammatory changes around the pancreatic tail and near the spleen, recommend correlation with any symptoms of normal pancreatitis.  Lipase was 654.  Chest x-ray showed hyperinflated lungs with chronic changes.  EKG showed nonspecific ST elevation, aVR and V1  Assessment & Plan:   Acute pancreatitis -Questionable cause.  Patient states that she does not drink that much alcohol and does not think that it is because of alcohol use. -will check right upper quadrant ultrasound.  CT angio of the chest showed inflammatory changes around the pancreatic tail and near the spleen, recommend correlation with any symptoms of normal pancreatitis.  Lipase 654 on presentation -Continue IV fluids/pain management/antiemetics as needed -Still intermittently nauseous but wants to try clear liquid diet.  Start clear liquid diet -Check triglyceride level in a.m.  Chest pain -Presented with chest pain with only very minimally elevated troponin which did not trend up.  EKG showed nonspecific ST elevation, aVR and V1.  Patient has had a history of abnormal EKG for years. -Consult cardiology.  Currently still having mild intermittent chest pain  Hyponatremia -Switch IV fluids to normal saline at 125 cc an hour.  Monitor  Thrombocytopenia -Questionable cause.  No signs of bleeding.  Monitor  Macrocytosis -Questionable cause.  Check T61, folic acid and TSH levels in a.m.   Dyslipidemia -Check lipid panel in a.m.  Tobacco  abuse -Smoking cessation was discussed by admitting provider with the patient  Alcohol use -Patient denies history of alcohol dependence or abuse.   DVT prophylaxis: Lovenox Code Status: Full Family Communication: None at bedside Disposition Plan: Status is: Inpatient  Remains inpatient appropriate because:Inpatient level of care appropriate due to severity of illness   Dispo: The patient is from: Home              Anticipated d/c is to: Home              Anticipated d/c date is: 2 days              Patient currently is not medically stable to d/c.   Consultants: Consulted cardiology  Procedures: None  Antimicrobials: None   Subjective: Patient seen and examined at bedside.  Still complains of intermittent abdominal pain with nausea requiring IV pain medication and nausea medication.  Still having some intermittent chest pain.  No overnight worsening shortness breath, dysuria or diarrhea.  Objective: Vitals:   07/15/20 2219 07/16/20 0211 07/16/20 0627 07/16/20 0940  BP: 134/77 126/82 132/83 132/82  Pulse: 72 77 74 90  Resp: 18 18 18 14   Temp: 98.4 F (36.9 C) 98.2 F (36.8 C) 98.4 F (36.9 C) 98.4 F (36.9 C)  TempSrc: Oral Oral Oral Oral  SpO2: 99% 99% 99% 98%  Weight:      Height:        Intake/Output Summary (Last 24 hours) at 07/16/2020 1132 Last data filed at 07/16/2020 0200 Gross per 24 hour  Intake 1383.41 ml  Output --  Net 1383.41 ml   Filed Weights   07/15/20 0755 07/15/20 2200  Weight: 47.6 kg 47.4 kg    Examination:  General exam: Appears calm and comfortable  Respiratory system: Bilateral decreased breath sounds at bases Cardiovascular system: S1 & S2 heard, Rate controlled Gastrointestinal system: Abdomen is nondistended, soft and mildly tender in the epigastric and periumbilical regions.  Normal bowel sounds heard. Extremities: No cyanosis, clubbing, edema  Central nervous system: Alert and oriented. No focal neurological deficits.  Moving extremities Skin: No rashes, lesions or ulcers Psychiatry: Judgement and insight appear normal. Mood & affect appropriate.     Data Reviewed: I have personally reviewed following labs and imaging studies  CBC: Recent Labs  Lab 07/15/20 0852 07/16/20 0432  WBC 10.4 7.4  NEUTROABS 9.4*  --   HGB 14.8 12.0  HCT 42.6 34.6*  MCV 103.4* 104.8*  PLT 156 782*   Basic Metabolic Panel: Recent Labs  Lab 07/15/20 0852 07/16/20 0432  NA 135 128*  K 3.8 3.7  CL 98 95*  CO2 21* 25  GLUCOSE 151* 96  BUN 7 7  CREATININE 0.60 0.42*  CALCIUM 9.6 8.2*   GFR: Estimated Creatinine Clearance: 58.1 mL/min (A) (by C-G formula based on SCr of 0.42 mg/dL (L)). Liver Function Tests: Recent Labs  Lab 07/15/20 0852 07/16/20 0432  AST 75* 35  ALT 25 16  ALKPHOS 111 72  BILITOT 2.2* 1.7*  PROT 8.0 6.0*  ALBUMIN 4.7 3.4*   Recent Labs  Lab 07/15/20 1111  LIPASE 654*   No results for input(s): AMMONIA in the last 168 hours. Coagulation Profile: No results for input(s): INR, PROTIME in the last 168 hours. Cardiac Enzymes: No results for input(s): CKTOTAL, CKMB, CKMBINDEX, TROPONINI in the last 168 hours. BNP (last 3 results) No results for input(s): PROBNP in the last 8760 hours. HbA1C: No results for input(s): HGBA1C in the last 72 hours. CBG: No results for input(s): GLUCAP in the last 168 hours. Lipid Profile: No results for input(s): CHOL, HDL, LDLCALC, TRIG, CHOLHDL, LDLDIRECT in the last 72 hours. Thyroid Function Tests: No results for input(s): TSH, T4TOTAL, FREET4, T3FREE, THYROIDAB in the last 72 hours. Anemia Panel: No results for input(s): VITAMINB12, FOLATE, FERRITIN, TIBC, IRON, RETICCTPCT in the last 72 hours. Sepsis Labs: No results for input(s): PROCALCITON, LATICACIDVEN in the last 168 hours.  Recent Results (from the past 240 hour(s))  SARS Coronavirus 2 by RT PCR (hospital order, performed in [facility] hospital lab) Nasopharyngeal Nasopharyngeal Swab      Status: None   Collection Time: 07/15/20 11:37 AM   Specimen: Nasopharyngeal Swab  Result Value Ref Range Status   SARS Coronavirus 2 NEGATIVE NEGATIVE Final    Comment: (NOTE) SARS-CoV-2 target nucleic acids are NOT DETECTED.  The SARS-CoV-2 RNA is generally detectable in upper and lower respiratory specimens during the acute phase of infection. The lowest concentration of SARS-CoV-2 viral copies this assay can detect is 250 copies / mL. A negative result does not preclude SARS-CoV-2 infection and should not be used as the sole basis for treatment or other patient management decisions.  A negative result may occur with improper specimen collection / handling, submission of specimen other than nasopharyngeal swab, presence of viral mutation(s) within the areas targeted by this assay, and inadequate number of viral copies (<250 copies / mL). A negative result must be combined with clinical observations, patient history, and epidemiological information.  Fact Sheet for Patients:   StrictlyIdeas.no  Fact Sheet for Healthcare Providers: BankingDealers.co.za  This test is not yet approved or  cleared by the  [place] Architectural technologist and has been authorized for detection and/or diagnosis of SARS-CoV-2 by FDA under an Print production planner (EUA).  This EUA will remain in effect (meaning this test can be used) for the duration of the COVID-19 declaration under Section 564(b)(1) of the Act, 21 U.S.C. section 360bbb-3(b)(1), unless the authorization is terminated or revoked sooner.  Performed at [facility], [place] [address], [place], [place] [address]          Radiology Studies: CT Angio Chest PE W and/or Wo Contrast  Result Date: 07/15/2020 CLINICAL DATA:  Chest pain since last evening. Positive D-dimer. EXAM: CT ANGIOGRAPHY CHEST WITH CONTRAST TECHNIQUE: Multidetector CT imaging of the chest was performed using  the standard protocol during bolus administration of intravenous contrast. Multiplanar CT image reconstructions and MIPs were obtained to evaluate the vascular anatomy. CONTRAST:  74mL OMNIPAQUE IOHEXOL 350 MG/ML SOLN COMPARISON:  09/23/2018 FINDINGS: Cardiovascular: The heart is normal in size. No pericardial effusion. Mild tortuosity of the thoracic aorta but no aneurysm or dissection. The branch vessels are patent. No definite coronary artery calcifications. The pulmonary arterial tree is well opacified. No filling defects to suggest pulmonary embolism. Mediastinum/Nodes: No mediastinal or hilar mass or adenopathy. The esophagus is grossly normal. The thyroid gland is unremarkable. Lungs/Pleura: The lungs are clear of an acute process. No pulmonary lesions or pleural effusions. Upper Abdomen: There appears to be some inflammatory type changes around the pancreatic tail and near the spleen. There is a small amount of fluid in the left anterior pararenal space. Recommend correlation with any symptoms of pancreatitis. Correlation with amylase level may be helpful. Musculoskeletal: No breast masses, supraclavicular or axillary adenopathy. No significant bony findings. Review of the MIP images confirms the above findings. IMPRESSION: 1. No CT findings for pulmonary embolism. 2. Normal thoracic aorta. 3. No acute pulmonary findings. 4. Inflammatory type changes around the pancreatic tail and near the spleen. Recommend correlation with any symptoms of pancreatitis. Correlation with amylase level may be helpful. Electronically Signed   By: Marijo Sanes M.D.   On: 07/15/2020 12:11   DG Chest Portable 1 View  Result Date: 07/15/2020 CLINICAL DATA:  Chest pain. EXAM: PORTABLE CHEST 1 VIEW COMPARISON:  10/20/2011 FINDINGS: The heart size and mediastinal contours are within normal limits. Stable bilateral pulmonary hyperinflation and emphysematous disease. There is no evidence of pulmonary edema, consolidation,  pneumothorax, nodule or pleural fluid. The visualized skeletal structures are unremarkable. IMPRESSION: Stable emphysematous lung disease. Electronically Signed   By: Aletta Edouard M.D.   On: 07/15/2020 08:33        Scheduled Meds: . enoxaparin (LOVENOX) injection  40 mg Subcutaneous Q24H  . pantoprazole (PROTONIX) IV  40 mg Intravenous Q24H   Continuous Infusions: . sodium chloride 125 mL/hr at 07/16/20 1030          Kewon Statler, MD [facility] 07/16/2020, 11:32 AM

## 2020-07-17 ENCOUNTER — Inpatient Hospital Stay (HOSPITAL_COMMUNITY): Payer: 59

## 2020-07-17 DIAGNOSIS — R079 Chest pain, unspecified: Secondary | ICD-10-CM

## 2020-07-17 DIAGNOSIS — K8500 Idiopathic acute pancreatitis without necrosis or infection: Secondary | ICD-10-CM

## 2020-07-17 DIAGNOSIS — F17210 Nicotine dependence, cigarettes, uncomplicated: Secondary | ICD-10-CM

## 2020-07-17 LAB — CBC WITH DIFFERENTIAL/PLATELET
Abs Immature Granulocytes: 0.04 10*3/uL (ref 0.00–0.07)
Basophils Absolute: 0 10*3/uL (ref 0.0–0.1)
Basophils Relative: 0 %
Eosinophils Absolute: 0.1 10*3/uL (ref 0.0–0.5)
Eosinophils Relative: 2 %
HCT: 30.7 % — ABNORMAL LOW (ref 36.0–46.0)
Hemoglobin: 10.3 g/dL — ABNORMAL LOW (ref 12.0–15.0)
Immature Granulocytes: 1 %
Lymphocytes Relative: 13 %
Lymphs Abs: 0.9 10*3/uL (ref 0.7–4.0)
MCH: 36.4 pg — ABNORMAL HIGH (ref 26.0–34.0)
MCHC: 33.6 g/dL (ref 30.0–36.0)
MCV: 108.5 fL — ABNORMAL HIGH (ref 80.0–100.0)
Monocytes Absolute: 0.5 10*3/uL (ref 0.1–1.0)
Monocytes Relative: 7 %
Neutro Abs: 5.5 10*3/uL (ref 1.7–7.7)
Neutrophils Relative %: 77 %
Platelets: 95 10*3/uL — ABNORMAL LOW (ref 150–400)
RBC: 2.83 MIL/uL — ABNORMAL LOW (ref 3.87–5.11)
RDW: 14.8 % (ref 11.5–15.5)
WBC: 7.1 10*3/uL (ref 4.0–10.5)
nRBC: 0 % (ref 0.0–0.2)

## 2020-07-17 LAB — LIPID PANEL
Cholesterol: 154 mg/dL (ref 0–200)
HDL: 37 mg/dL — ABNORMAL LOW (ref >40)
LDL Cholesterol: 100 mg/dL — ABNORMAL HIGH (ref 0–99)
Total CHOL/HDL Ratio: 4.2 RATIO
Triglycerides: 84 mg/dL (ref <150)
VLDL: 17 mg/dL (ref 0–40)

## 2020-07-17 LAB — COMPREHENSIVE METABOLIC PANEL
ALT: 12 U/L (ref 0–44)
AST: 26 U/L (ref 15–41)
Albumin: 2.9 g/dL — ABNORMAL LOW (ref 3.5–5.0)
Alkaline Phosphatase: 71 U/L (ref 38–126)
Anion gap: 6 (ref 5–15)
BUN: 5 mg/dL — ABNORMAL LOW (ref 6–20)
CO2: 24 mmol/L (ref 22–32)
Calcium: 7.8 mg/dL — ABNORMAL LOW (ref 8.9–10.3)
Chloride: 101 mmol/L (ref 98–111)
Creatinine, Ser: 0.39 mg/dL — ABNORMAL LOW (ref 0.44–1.00)
GFR calc Af Amer: >60 mL/min (ref >60)
GFR calc non Af Amer: >60 mL/min (ref >60)
Glucose, Bld: 92 mg/dL (ref 70–99)
Potassium: 3 mmol/L — ABNORMAL LOW (ref 3.5–5.1)
Sodium: 131 mmol/L — ABNORMAL LOW (ref 135–145)
Total Bilirubin: 1.8 mg/dL — ABNORMAL HIGH (ref 0.3–1.2)
Total Protein: 5.7 g/dL — ABNORMAL LOW (ref 6.5–8.1)

## 2020-07-17 LAB — ECHOCARDIOGRAM COMPLETE
Area-P 1/2: 2.83 cm2
Height: 66.5 in
MV M vel: 5.71 m/s
MV Peak grad: 130.4 mmHg
S' Lateral: 2.5 cm
Weight: 1671.97 oz

## 2020-07-17 LAB — MAGNESIUM: Magnesium: 1.5 mg/dL — ABNORMAL LOW (ref 1.7–2.4)

## 2020-07-17 MED ORDER — POTASSIUM CHLORIDE 2 MEQ/ML IV SOLN
INTRAVENOUS | Status: DC
  Filled 2020-07-17 (×5): qty 1000

## 2020-07-17 MED ORDER — MAGNESIUM SULFATE 2 GM/50ML IV SOLN
2.0000 g | Freq: Once | INTRAVENOUS | Status: AC
  Administered 2020-07-17: 2 g via INTRAVENOUS
  Filled 2020-07-17: qty 50

## 2020-07-17 NOTE — Progress Notes (Signed)
PROGRESS NOTE  Amber Roberts  AVW:[id number] DOB: [birth year] DOA: 07/15/2020 PCP: [name], NP   Brief Narrative: Amber Roberts is a 59 y.o. female with a history of cervical CA, gout, fibromyalgia, tobacco use who presented on 9/13 with chest/upper abdominal pain with nausea and intractable vomiting. On presentation, she had elevated d-dimer and mildly elevated troponin. CT angiogram showed no findings of pulmonary embolism but lungs were hyperinflated and inflammatory changes around the pancreatic tail and near the spleen. Lipase was 654. She was admitted with her first episode of acute pancreatitis and chest pain with cardiology consultation.  Assessment & Plan: Principal Problem:   Acute pancreatitis Active Problems:   GERD (gastroesophageal reflux disease)   CAD (coronary artery disease)   Nicotine dependence  Acute pancreatitis: Has persistent but not abusive alcohol intake, long time smoking hx, no evidence of gallstones, triglycerides wnl, and not recent started on typical provocative agents.  - Continue clear liquids as she has not taken a sufficient amount by mouth. Decrease IVF volume with elevated pressures on echo.  - Continue antiemetics, analgesics - Would recommend repeat dedicated imaging following resolution of this episode to rule out underlying mass.  - Alcohol cessation and smoking cessation counseling provided.   Chest pain: No calcifications. No regional wall motion abnormalities on echo. - Cardiology started beta blocker.   Tobacco use:  - Cessation counseling provided  Hypokalemia:  - Supplement in IVF  Hypomagnesemia:  - Supplement and monitor  Underweight:  - Dietitian consulted.  - Advancing diet as tolerated.   Hyponatremia:  - Continue isotonic IVF  Macrocytosis:  - Check anemia panel   Thrombocytopenia: Decreasing - Monitor in AM. Check immature fraction. Ok to continue lovenox at this time.   Dyslipidemia:  - LDL 100,  HDL 37.   DVT prophylaxis: Lovenox Code Status: Full Family Communication: None at bedside Disposition Plan:  Status is: Inpatient  Remains inpatient appropriate because:IV treatments appropriate due to intensity of illness or inability to take PO   Dispo: The patient is from: Home              Anticipated d/c is to: Home              Anticipated d/c date is: 2 days              Patient currently is not medically stable to d/c.  Consultants:   Cardiology  Procedures:   Echocardiogram   Antimicrobials:  None   Subjective: Had some soreness across both sides, lower ribs that radiates to the back, moderate, improved with IV dilaudid. Minimal nausea, but not taking much (a few bites of yellow Jell-o, some sprite).   Objective: Vitals:   07/16/20 1308 07/16/20 2038 07/17/20 0618 07/17/20 1219  BP: 131/78 129/83 (!) 152/83 136/79  Pulse: 69 71 80 77  Resp: 15 18 18 18   Temp: 98.7 F (37.1 C) 98.9 F (37.2 C) 98.6 F (37 C) 98.3 F (36.8 C)  TempSrc: Oral Oral Oral Oral  SpO2: 100% 97% 99% 100%  Weight:      Height:        Intake/Output Summary (Last 24 hours) at 07/17/2020 1759 Last data filed at 07/17/2020 0600 Gross per 24 hour  Intake 2472.41 ml  Output --  Net 2472.41 ml   Filed Weights   07/15/20 0755 07/15/20 2200  Weight: 47.6 kg 47.4 kg    Gen: 59 y.o. female in no distress Pulm: Non-labored breathing. Clear to auscultation bilaterally.  CV: Regular rate and rhythm. No murmur, rub, or gallop. No JVD, no pitting pedal edema. GI: Abdomen soft, tender most in epigastrium with some guarding, non-distended, with normoactive bowel sounds. No organomegaly or masses felt. Ext: Warm, no deformities Skin: No rashes, lesions or ulcers Neuro: Alert and oriented. No focal neurological deficits. Psych: Judgement and insight appear normal. Mood & affect appropriate.   Data Reviewed: I have personally reviewed following labs and imaging studies  CBC: Recent  Labs  Lab 07/15/20 0852 07/16/20 0432 07/17/20 0435  WBC 10.4 7.4 7.1  NEUTROABS 9.4*  --  5.5  HGB 14.8 12.0 10.3*  HCT 42.6 34.6* 30.7*  MCV 103.4* 104.8* 108.5*  PLT 156 124* 95*   Basic Metabolic Panel: Recent Labs  Lab 07/15/20 0852 07/16/20 0432 07/17/20 0435  NA 135 128* 131*  K 3.8 3.7 3.0*  CL 98 95* 101  CO2 21* 25 24  GLUCOSE 151* 96 92  BUN 7 7 5*  CREATININE 0.60 0.42* 0.39*  CALCIUM 9.6 8.2* 7.8*  MG  --   --  1.5*   GFR: Estimated Creatinine Clearance: 58.1 mL/min (A) (by C-G formula based on SCr of 0.39 mg/dL (L)). Liver Function Tests: Recent Labs  Lab 07/15/20 0852 07/16/20 0432 07/17/20 0435  AST 75* 35 26  ALT 25 16 12   ALKPHOS 111 72 71  BILITOT 2.2* 1.7* 1.8*  PROT 8.0 6.0* 5.7*  ALBUMIN 4.7 3.4* 2.9*   Recent Labs  Lab 07/15/20 1111  LIPASE 654*   No results for input(s): AMMONIA in the last 168 hours. Coagulation Profile: No results for input(s): INR, PROTIME in the last 168 hours. Cardiac Enzymes: No results for input(s): CKTOTAL, CKMB, CKMBINDEX, TROPONINI in the last 168 hours. BNP (last 3 results) No results for input(s): PROBNP in the last 8760 hours. HbA1C: No results for input(s): HGBA1C in the last 72 hours. CBG: No results for input(s): GLUCAP in the last 168 hours. Lipid Profile: Recent Labs    07/17/20 0435  CHOL 154  HDL 37*  LDLCALC 100*  TRIG 84  CHOLHDL 4.2   Thyroid Function Tests: No results for input(s): TSH, T4TOTAL, FREET4, T3FREE, THYROIDAB in the last 72 hours. Anemia Panel: No results for input(s): VITAMINB12, FOLATE, FERRITIN, TIBC, IRON, RETICCTPCT in the last 72 hours. Urine analysis:    Component Value Date/Time   BILIRUBINUR Negative 05/31/2020 1000   PROTEINUR Negative 05/31/2020 1000   UROBILINOGEN 0.2 05/31/2020 1000   NITRITE Negative 05/31/2020 1000   LEUKOCYTESUR Negative 05/31/2020 1000   Recent Results (from the past 240 hour(s))  SARS Coronavirus 2 by RT PCR (hospital order,  performed in [facility] hospital lab) Nasopharyngeal Nasopharyngeal Swab     Status: None   Collection Time: 07/15/20 11:37 AM   Specimen: Nasopharyngeal Swab  Result Value Ref Range Status   SARS Coronavirus 2 NEGATIVE NEGATIVE Final    Comment: (NOTE) SARS-CoV-2 target nucleic acids are NOT DETECTED.  The SARS-CoV-2 RNA is generally detectable in upper and lower respiratory specimens during the acute phase of infection. The lowest concentration of SARS-CoV-2 viral copies this assay can detect is 250 copies / mL. A negative result does not preclude SARS-CoV-2 infection and should not be used as the sole basis for treatment or other patient management decisions.  A negative result may occur with improper specimen collection / handling, submission of specimen other than nasopharyngeal swab, presence of viral mutation(s) within the areas targeted by this assay, and inadequate number of viral copies (<  250 copies / mL). A negative result must be combined with clinical observations, patient history, and epidemiological information.  Fact Sheet for Patients:   StrictlyIdeas.no  Fact Sheet for Healthcare Providers: BankingDealers.co.za  This test is not yet approved or  cleared by the [place] FDA and has been authorized for detection and/or diagnosis of SARS-CoV-2 by FDA under an Emergency Use Authorization (EUA).  This EUA will remain in effect (meaning this test can be used) for the duration of the COVID-19 declaration under Section 564(b)(1) of the Act, 21 U.S.C. section 360bbb-3(b)(1), unless the authorization is terminated or revoked sooner.  Performed at [facility], [place] [address] [place], [place] [address]       Radiology Studies: ECHOCARDIOGRAM COMPLETE  Result Date: 07/17/2020    ECHOCARDIOGRAM REPORT   Patient Name:   LYNDSIE WALLMAN Date of Exam: 07/17/2020 Medical Rec #:  [id number]          Height:       66.5 in Accession #:    [id number]        Weight:       104.5 lb Date of Birth:  11-23-1960         BSA:          1.526 m Patient Age:    54 years          BP:           152/83 mmHg Patient Gender: F                 HR:           80 bpm. Exam Location:  Inpatient Procedure: 2D Echo Indications:    chest pain 786.50  History:        Patient has no prior history of Echocardiogram examinations.                 Previous Myocardial Infarction; Risk Factors:Dyslipidemia and                 Current Smoker.  Sonographer:    Jannett Celestine RDCS (AE) Referring Phys: [phone] [facility] CROITORU  Sonographer Comments: Suboptimal parasternal window. very low parasternal window IMPRESSIONS  1. Left ventricular ejection fraction, by estimation, is 60 to 65%. The left ventricle has normal function. The left ventricle has no regional wall motion abnormalities. Left ventricular diastolic parameters are consistent with Grade II diastolic dysfunction (pseudonormalization). Elevated left atrial pressure.  2. Right ventricular systolic function is normal. The right ventricular size is normal. Tricuspid regurgitation signal is inadequate for assessing PA pressure.  3. The mitral valve is normal in structure. Mild to moderate mitral valve regurgitation. No evidence of mitral stenosis.  4. The aortic valve is normal in structure. Aortic valve regurgitation is not visualized. No aortic stenosis is present.  5. The inferior vena cava is dilated in size with <50% respiratory variability, suggesting right atrial pressure of 15 mmHg. FINDINGS  Left Ventricle: Left ventricular ejection fraction, by estimation, is 60 to 65%. The left ventricle has normal function. The left ventricle has no regional wall motion abnormalities. The left ventricular internal cavity size was normal in size. There is  no left ventricular hypertrophy. Left ventricular diastolic parameters are consistent with Grade II diastolic dysfunction (pseudonormalization).  Elevated left atrial pressure. Right Ventricle: The right ventricular size is normal. No increase in right ventricular wall thickness. Right ventricular systolic function is normal. Tricuspid regurgitation signal is inadequate for assessing PA pressure. Left Atrium: Left atrial  size was normal in size. Right Atrium: Right atrial size was normal in size. Pericardium: There is no evidence of pericardial effusion. Mitral Valve: The mitral valve is normal in structure. There is mild thickening of the mitral valve leaflet(s). Mild to moderate mitral valve regurgitation, with centrally-directed jet. No evidence of mitral valve stenosis. Tricuspid Valve: The tricuspid valve is normal in structure. Tricuspid valve regurgitation is not demonstrated. No evidence of tricuspid stenosis. Aortic Valve: The aortic valve is normal in structure. Aortic valve regurgitation is not visualized. No aortic stenosis is present. Pulmonic Valve: The pulmonic valve was normal in structure. Pulmonic valve regurgitation is not visualized. No evidence of pulmonic stenosis. Aorta: The aortic root is normal in size and structure. Venous: The inferior vena cava is dilated in size with less than 50% respiratory variability, suggesting right atrial pressure of 15 mmHg. IAS/Shunts: No atrial level shunt detected by color flow Doppler.  LEFT VENTRICLE PLAX 2D LVIDd:         3.70 cm  Diastology LVIDs:         2.50 cm  LV e' medial:    7.29 cm/s LV PW:         0.90 cm  LV E/e' medial:  16.0 LV IVS:        0.80 cm  LV e' lateral:   9.57 cm/s LVOT diam:     1.80 cm  LV E/e' lateral: 12.2 LV SV:         49 LV SV Index:   32 LVOT Area:     2.54 cm  RIGHT VENTRICLE RV S prime:     14.70 cm/s TAPSE (M-mode): 1.4 cm LEFT ATRIUM           Index LA diam:      3.00 cm 1.97 cm/m LA Vol (A2C): 26.1 ml 17.10 ml/m LA Vol (A4C): 21.3 ml 13.96 ml/m  AORTIC VALVE LVOT Vmax:   98.80 cm/s LVOT Vmean:  68.200 cm/s LVOT VTI:    0.191 m  AORTA Ao Root diam: 2.40 cm MITRAL  VALVE MV Area (PHT): 2.83 cm     SHUNTS MV Decel Time: 268 msec     Systemic VTI:  0.19 m MR Peak grad: 130.4 mmHg    Systemic Diam: 1.80 cm MR Mean grad: 79.0 mmHg MR Vmax:      571.00 cm/s MR Vmean:     423.0 cm/s MV E velocity: 117.00 cm/s MV A velocity: 92.40 cm/s MV E/A ratio:  1.27 Mihai Croitoru MD Electronically signed by Sanda Klein MD Signature Date/Time: 07/17/2020/9:52:08 AM    Final    US Abdomen Limited RUQ  Result Date: 07/16/2020 CLINICAL DATA:  Acute pancreatitis EXAM: ULTRASOUND ABDOMEN LIMITED RIGHT UPPER QUADRANT COMPARISON:  Chest CT including portions of the upper abdomen July 15, 2020 FINDINGS: Gallbladder: No gallstones or wall thickening visualized. There is no pericholecystic fluid. No sonographic Murphy sign noted by sonographer. Common bile duct: Diameter: 7 mm, upper normal. No intrahepatic, common hepatic, or common bile duct dilatation. Liver: No focal liver lesions. Liver echogenicity is mildly inhomogeneous which may indicate foci of fatty infiltration. Portal vein is patent on color Doppler imaging with normal direction of blood flow towards the liver. Other: Mild ascites. IMPRESSION: 1.  Mild ascites. 2. Inhomogeneous echotexture in the liver, a finding likely indicative of focal fatty infiltration. No focal liver lesions evident. 3.  Study otherwise unremarkable. Electronically Signed   By: Lowella Grip III M.D.   On: 07/16/2020 16:38  Scheduled Meds: . enoxaparin (LOVENOX) injection  40 mg Subcutaneous Q24H  . metoprolol tartrate  12.5 mg Oral BID  . pantoprazole (PROTONIX) IV  40 mg Intravenous Q24H   Continuous Infusions: . sodium chloride 125 mL/hr at 07/17/20 0936     LOS: 2 days   Time spent: 25 minutes.  Patrecia Pour, MD [facility] [email or web address] 07/17/2020, 5:59 PM

## 2020-07-17 NOTE — Progress Notes (Signed)
Resumed care for this pt at 0300. I agree with previous RN's assessment.

## 2020-07-17 NOTE — Plan of Care (Signed)

## 2020-07-17 NOTE — Progress Notes (Signed)
°  Echocardiogram 2D Echocardiogram has been performed.  Amber Roberts 07/17/2020, 9:01 AM

## 2020-07-17 NOTE — Plan of Care (Signed)
  Problem: Pain Managment: Goal: General experience of comfort will improve Outcome: Not Progressing   

## 2020-07-17 NOTE — Progress Notes (Addendum)
Progress Note  Patient Name: Amber Roberts Date of Encounter: 07/17/2020  [facility] Cardiologist: Minus Breeding, MD   Subjective   She had some transient chest discomfort as she was getting IV dilaudid that resolved quickly. Continues to have chest discomfort across her chest with "sore ribs."  Inpatient Medications    Scheduled Meds:  enoxaparin (LOVENOX) injection  40 mg Subcutaneous Q24H   metoprolol tartrate  12.5 mg Oral BID   pantoprazole (PROTONIX) IV  40 mg Intravenous Q24H   Continuous Infusions:  sodium chloride 125 mL/hr at 07/17/20 0600   magnesium sulfate bolus IVPB     PRN Meds: HYDROmorphone (DILAUDID) injection, LORazepam **OR** LORazepam, ondansetron **OR** ondansetron (ZOFRAN) IV   Vital Signs    Vitals:   07/16/20 0940 07/16/20 1308 07/16/20 2038 07/17/20 0618  BP: 132/82 131/78 129/83 (!) 152/83  Pulse: 90 69 71 80  Resp: 14 15 18 18   Temp: 98.4 F (36.9 C) 98.7 F (37.1 C) 98.9 F (37.2 C) 98.6 F (37 C)  TempSrc: Oral Oral Oral Oral  SpO2: 98% 100% 97% 99%  Weight:      Height:        Intake/Output Summary (Last 24 hours) at 07/17/2020 0832 Last data filed at 07/17/2020 0600 Gross per 24 hour  Intake 2472.41 ml  Output --  Net 2472.41 ml   Last 3 Weights 07/15/2020 07/15/2020 06/28/2020  Weight (lbs) 104 lb 8 oz 105 lb 107 lb  Weight (kg) 47.4 kg 47.628 kg 48.535 kg      Telemetry    Sinus rhythm HR 70s, PACs - Personally Reviewed  ECG    No new tracings - Personally Reviewed  Physical Exam   GEN: No acute distress.   Neck: No JVD Cardiac: RRR, no murmurs, rubs, or gallops.  Respiratory: Clear to auscultation bilaterally. GI: Soft, nontender, non-distended  MS: No edema; No deformity. Neuro:  Nonfocal  Psych: Normal affect   Labs    High Sensitivity Troponin:   Recent Labs  Lab 07/15/20 0852 07/15/20 1111  TROPONINIHS 27* 35*      Chemistry Recent Labs  Lab 07/15/20 0852 07/16/20 0432 07/17/20 0435   NA 135 128* 131*  K 3.8 3.7 3.0*  CL 98 95* 101  CO2 21* 25 24  GLUCOSE 151* 96 92  BUN 7 7 5*  CREATININE 0.60 0.42* 0.39*  CALCIUM 9.6 8.2* 7.8*  PROT 8.0 6.0* 5.7*  ALBUMIN 4.7 3.4* 2.9*  AST 75* 35 26  ALT 25 16 12   ALKPHOS 111 72 71  BILITOT 2.2* 1.7* 1.8*  GFRNONAA >60 >60 >60  GFRAA >60 >60 >60  ANIONGAP 16* 8 6     Hematology Recent Labs  Lab 07/15/20 0852 07/16/20 0432 07/17/20 0435  WBC 10.4 7.4 7.1  RBC 4.12 3.30* 2.83*  HGB 14.8 12.0 10.3*  HCT 42.6 34.6* 30.7*  MCV 103.4* 104.8* 108.5*  MCH 35.9* 36.4* 36.4*  MCHC 34.7 34.7 33.6  RDW 14.8 14.6 14.8  PLT 156 124* 95*    BNPNo results for input(s): BNP, PROBNP in the last 168 hours.   DDimer  Recent Labs  Lab 07/15/20 [phone]  DDIMER 1.75*     Radiology    CT Angio Chest PE W and/or Wo Contrast  Result Date: 07/15/2020 CLINICAL DATA:  Chest pain since last evening. Positive D-dimer. EXAM: CT ANGIOGRAPHY CHEST WITH CONTRAST TECHNIQUE: Multidetector CT imaging of the chest was performed using the standard protocol during bolus administration of intravenous contrast. Multiplanar  CT image reconstructions and MIPs were obtained to evaluate the vascular anatomy. CONTRAST:  89mL OMNIPAQUE IOHEXOL 350 MG/ML SOLN COMPARISON:  09/23/2018 FINDINGS: Cardiovascular: The heart is normal in size. No pericardial effusion. Mild tortuosity of the thoracic aorta but no aneurysm or dissection. The branch vessels are patent. No definite coronary artery calcifications. The pulmonary arterial tree is well opacified. No filling defects to suggest pulmonary embolism. Mediastinum/Nodes: No mediastinal or hilar mass or adenopathy. The esophagus is grossly normal. The thyroid gland is unremarkable. Lungs/Pleura: The lungs are clear of an acute process. No pulmonary lesions or pleural effusions. Upper Abdomen: There appears to be some inflammatory type changes around the pancreatic tail and near the spleen. There is a small amount of  fluid in the left anterior pararenal space. Recommend correlation with any symptoms of pancreatitis. Correlation with amylase level may be helpful. Musculoskeletal: No breast masses, supraclavicular or axillary adenopathy. No significant bony findings. Review of the MIP images confirms the above findings. IMPRESSION: 1. No CT findings for pulmonary embolism. 2. Normal thoracic aorta. 3. No acute pulmonary findings. 4. Inflammatory type changes around the pancreatic tail and near the spleen. Recommend correlation with any symptoms of pancreatitis. Correlation with amylase level may be helpful. Electronically Signed   By: Marijo Sanes M.D.   On: 07/15/2020 12:11   US Abdomen Limited RUQ  Result Date: 07/16/2020 CLINICAL DATA:  Acute pancreatitis EXAM: ULTRASOUND ABDOMEN LIMITED RIGHT UPPER QUADRANT COMPARISON:  Chest CT including portions of the upper abdomen July 15, 2020 FINDINGS: Gallbladder: No gallstones or wall thickening visualized. There is no pericholecystic fluid. No sonographic Murphy sign noted by sonographer. Common bile duct: Diameter: 7 mm, upper normal. No intrahepatic, common hepatic, or common bile duct dilatation. Liver: No focal liver lesions. Liver echogenicity is mildly inhomogeneous which may indicate foci of fatty infiltration. Portal vein is patent on color Doppler imaging with normal direction of blood flow towards the liver. Other: Mild ascites. IMPRESSION: 1.  Mild ascites. 2. Inhomogeneous echotexture in the liver, a finding likely indicative of focal fatty infiltration. No focal liver lesions evident. 3.  Study otherwise unremarkable. Electronically Signed   By: Lowella Grip III M.D.   On: 07/16/2020 16:38    Cardiac Studies   Echo in progress  Patient Profile     59 y.o. female  with a hx of cervical cancer, GERD, ongoing tobacco use, hyperlipidemia not on medication, chronic back pain, fibromyalgia and known abnormal EKG who is being seen today for the evaluation  of chest pain  Assessment & Plan    Chest pain - third troponin not drawn - will await echocardiogram results - do not anticipate further inpatient ischemic evaluation given acute illness with pacreatitis - have started low dose BB  - no calcifications seen on CTA 07/15/20   Hyperlipidemia 07/17/2020: Cholesterol 154; HDL 37; LDL Cholesterol 100; Triglycerides 84; VLDL 17   Hypertension - pressure elevated today compared to yesterday - follow this morning, could add 5 mg amlodipine for pressure control - results of echo will guide medication selection   Tobacco use - encouraged cessation  Will message the office for cardiology follow up.    For questions or updates, please contact [facility] Please consult [email or web address] for contact info under        Signed, Ledora Bottcher, PA  07/17/2020, 8:32 AM    I have seen and examined the patient along with Ledora Bottcher, PA ;  I have reviewed the chart, notes and  new data.  I agree with PA/NP's note.  Key new complaints: no new CV complaints Key examination changes: normal CV exam Key new findings / data: normal echo - no wall motion abnormalities, no ASD, 2+ MR. Does appear volume overloaded (both RA and LA pressures are high) by echo. Limit IV fluids.  PLAN: [facility] will sign off.   Medication Recommendations:  reduce IV fluids Other recommendations (labs, testing, etc):  n/a Follow up as an outpatient:  as previously scheduled   Sanda Klein, MD, [facility] [phone] 07/17/2020, 2:57 PM

## 2020-07-18 LAB — COMPREHENSIVE METABOLIC PANEL
ALT: 15 U/L (ref 0–44)
AST: 30 U/L (ref 15–41)
Albumin: 3 g/dL — ABNORMAL LOW (ref 3.5–5.0)
Alkaline Phosphatase: 101 U/L (ref 38–126)
Anion gap: 8 (ref 5–15)
BUN: <5 mg/dL — ABNORMAL LOW (ref 6–20)
CO2: 24 mmol/L (ref 22–32)
Calcium: 8.2 mg/dL — ABNORMAL LOW (ref 8.9–10.3)
Chloride: 95 mmol/L — ABNORMAL LOW (ref 98–111)
Creatinine, Ser: 0.41 mg/dL — ABNORMAL LOW (ref 0.44–1.00)
GFR calc Af Amer: >60 mL/min (ref >60)
GFR calc non Af Amer: >60 mL/min (ref >60)
Glucose, Bld: 73 mg/dL (ref 70–99)
Potassium: 3.1 mmol/L — ABNORMAL LOW (ref 3.5–5.1)
Sodium: 127 mmol/L — ABNORMAL LOW (ref 135–145)
Total Bilirubin: 2.3 mg/dL — ABNORMAL HIGH (ref 0.3–1.2)
Total Protein: 5.8 g/dL — ABNORMAL LOW (ref 6.5–8.1)

## 2020-07-18 LAB — RETICULOCYTES
Immature Retic Fract: 23.4 % — ABNORMAL HIGH (ref 2.3–15.9)
RBC.: 2.72 MIL/uL — ABNORMAL LOW (ref 3.87–5.11)
Retic Count, Absolute: 37.3 10*3/uL (ref 19.0–186.0)
Retic Ct Pct: 1.4 % (ref 0.4–3.1)

## 2020-07-18 LAB — CBC
HCT: 28.8 % — ABNORMAL LOW (ref 36.0–46.0)
Hemoglobin: 10.1 g/dL — ABNORMAL LOW (ref 12.0–15.0)
MCH: 37.1 pg — ABNORMAL HIGH (ref 26.0–34.0)
MCHC: 35.1 g/dL (ref 30.0–36.0)
MCV: 105.9 fL — ABNORMAL HIGH (ref 80.0–100.0)
Platelets: 97 10*3/uL — ABNORMAL LOW (ref 150–400)
RBC: 2.72 MIL/uL — ABNORMAL LOW (ref 3.87–5.11)
RDW: 14.6 % (ref 11.5–15.5)
WBC: 8.2 10*3/uL (ref 4.0–10.5)
nRBC: 0 % (ref 0.0–0.2)

## 2020-07-18 LAB — IRON AND TIBC
Iron: 29 ug/dL (ref 28–170)
Saturation Ratios: 13 % (ref 10.4–31.8)
TIBC: 224 ug/dL — ABNORMAL LOW (ref 250–450)
UIBC: 195 ug/dL

## 2020-07-18 LAB — FERRITIN: Ferritin: 318 ng/mL — ABNORMAL HIGH (ref 11–307)

## 2020-07-18 LAB — VITAMIN B12: Vitamin B-12: 467 pg/mL (ref 180–914)

## 2020-07-18 LAB — FOLATE: Folate: 8.5 ng/mL (ref >5.9)

## 2020-07-18 LAB — IMMATURE PLATELET FRACTION: Immature Platelet Fraction: 7.3 % (ref 1.2–8.6)

## 2020-07-18 MED ORDER — HYDROMORPHONE HCL 1 MG/ML IJ SOLN
1.0000 mg | INTRAMUSCULAR | Status: DC | PRN
  Administered 2020-07-18 – 2020-07-19 (×4): 1 mg via INTRAVENOUS
  Filled 2020-07-18 (×4): qty 1

## 2020-07-18 MED ORDER — OXYCODONE HCL 5 MG PO TABS
5.0000 mg | ORAL_TABLET | ORAL | Status: DC | PRN
  Administered 2020-07-18 – 2020-07-20 (×8): 5 mg via ORAL
  Filled 2020-07-18 (×8): qty 1

## 2020-07-18 NOTE — Progress Notes (Signed)
PROGRESS NOTE  Amber Roberts  LYY:[id number] DOB: 02-28-1961 DOA: 07/15/2020 PCP: [name], NP   Brief Narrative: Amber Roberts is a 59 y.o. female with a history of cervical CA, gout, fibromyalgia, tobacco use who presented on 9/13 with chest/upper abdominal pain with nausea and intractable vomiting. On presentation, she had elevated d-dimer and mildly elevated troponin. CT angiogram showed no findings of pulmonary embolism but lungs were hyperinflated and inflammatory changes around the pancreatic tail and near the spleen. Lipase was 654. She was admitted with her first episode of acute pancreatitis and chest pain with cardiology consultation.  Assessment & Plan: Principal Problem:   Acute pancreatitis Active Problems:   GERD (gastroesophageal reflux disease)   CAD (coronary artery disease)   Nicotine dependence  Acute pancreatitis: Has persistent but not abusive alcohol intake, long time smoking hx, no evidence of gallstones, triglycerides wnl, and not recent started on typical provocative agents.  - Advance to fulls, and if tolerated to soft over next 24 hrs.  - Continue antiemetics  - Would recommend repeat dedicated imaging following resolution of this episode to rule out underlying mass.  - Alcohol cessation and smoking cessation counseling provided.   Chest pain: No calcifications. No regional wall motion abnormalities on echo. - Cardiology started beta blocker.  - Echo also showed elevated filling pressures. IVF rate decreased.  Tobacco use:  - Cessation counseling provided  Hypokalemia:  - Supplement in IVF at 72mEq/L, check Mg, BMP in AM  Hypomagnesemia:  - Supplemented, recheck in AM.   Underweight:  - Dietitian consulted.  - Advancing diet as tolerated.   Hyponatremia:  - Changed to LR, lowered rate. Only po intake has essentially been water without much solute.   Macrocytic anemia: Folic acid, L27 wnl. Iron studies consistent with inflammatory  state.  - Monitor  Thrombocytopenia: Stabilizing. IPF is wnl.  - Would warrant further work up if platelet count remains suppressed outside of acute illness. - Ok to continue lovenox at this time.   Dyslipidemia:  - LDL 100, HDL 37.   DVT prophylaxis: Lovenox Code Status: Full Family Communication: None at bedside Disposition Plan:  Status is: Inpatient  Remains inpatient appropriate because:IV treatments appropriate due to intensity of illness or inability to take PO   Dispo: The patient is from: Home              Anticipated d/c is to: Home              Anticipated d/c date is: 1 - 2 days              Patient currently is not medically stable to d/c.  Consultants:   Cardiology  Procedures:   Echocardiogram   Antimicrobials:  None   Subjective: Abd pain not worsened by taking clear liquids, hungry to advance to fulls this AM. Some nausea improved with zofran, no vomiting. No fevers. No dyspnea.   Objective: Vitals:   07/17/20 2055 07/18/20 0455 07/18/20 0800 07/18/20 1312  BP: (!) 149/86 129/84 (!) 151/80 (!) 141/80  Pulse: 80 77 83 80  Resp: 16 18 16    Temp: 98.8 F (37.1 C) 98.3 F (36.8 C) 98.7 F (37.1 C) 99 F (37.2 C)  TempSrc: Oral Oral Oral Oral  SpO2: 99% 97% 98% 96%  Weight:      Height:        Intake/Output Summary (Last 24 hours) at 07/18/2020 1829 Last data filed at 07/18/2020 1739 Gross per 24 hour  Intake 783.08  ml  Output --  Net 783.08 ml   Filed Weights   07/15/20 0755 07/15/20 2200  Weight: 47.6 kg 47.4 kg   Gen: 59 y.o. female in no distress Pulm: Nonlabored breathing room air. Clear, no crackles. CV: Regular rate and rhythm. No murmur, rub, or gallop. No JVD, no dependent edema. GI: Abdomen soft, mildly tender along LUQ without rebound, non-distended, with normoactive bowel sounds.  Ext: Warm, no deformities Skin: No rashes, lesions or ulcers on visualized skin. Neuro: Alert and oriented. No focal neurological  deficits. Psych: Judgement and insight appear fair. Mood euthymic & affect congruent. Behavior is appropriate.    Data Reviewed: I have personally reviewed following labs and imaging studies  CBC: Recent Labs  Lab 07/15/20 0852 07/16/20 0432 07/17/20 0435 07/18/20 0454  WBC 10.4 7.4 7.1 8.2  NEUTROABS 9.4*  --  5.5  --   HGB 14.8 12.0 10.3* 10.1*  HCT 42.6 34.6* 30.7* 28.8*  MCV 103.4* 104.8* 108.5* 105.9*  PLT 156 124* 95* 97*   Basic Metabolic Panel: Recent Labs  Lab 07/15/20 0852 07/16/20 0432 07/17/20 0435 07/18/20 0454  NA 135 128* 131* 127*  K 3.8 3.7 3.0* 3.1*  CL 98 95* 101 95*  CO2 21* 25 24 24   GLUCOSE 151* 96 92 73  BUN 7 7 5* <5*  CREATININE 0.60 0.42* 0.39* 0.41*  CALCIUM 9.6 8.2* 7.8* 8.2*  MG  --   --  1.5*  --    GFR: Estimated Creatinine Clearance: 58.1 mL/min (A) (by C-G formula based on SCr of 0.41 mg/dL (L)). Liver Function Tests: Recent Labs  Lab 07/15/20 0852 07/16/20 0432 07/17/20 0435 07/18/20 0454  AST 75* 35 26 30  ALT 25 16 12 15   ALKPHOS 111 72 71 101  BILITOT 2.2* 1.7* 1.8* 2.3*  PROT 8.0 6.0* 5.7* 5.8*  ALBUMIN 4.7 3.4* 2.9* 3.0*   Recent Labs  Lab 07/15/20 1111  LIPASE 654*   Lipid Profile: Recent Labs    07/17/20 0435  CHOL 154  HDL 37*  LDLCALC 100*  TRIG 84  CHOLHDL 4.2   Thyroid Function Tests: No results for input(s): TSH, T4TOTAL, FREET4, T3FREE, THYROIDAB in the last 72 hours. Anemia Panel: Recent Labs    07/18/20 0454  VITAMINB12 467  FOLATE 8.5  FERRITIN 318*  TIBC 224*  IRON 29  RETICCTPCT 1.4   Urine analysis:    Component Value Date/Time   BILIRUBINUR Negative 05/31/2020 1000   PROTEINUR Negative 05/31/2020 1000   UROBILINOGEN 0.2 05/31/2020 1000   NITRITE Negative 05/31/2020 1000   LEUKOCYTESUR Negative 05/31/2020 1000   Recent Results (from the past 240 hour(s))  SARS Coronavirus 2 by RT PCR (hospital order, performed in [facility] hospital lab) Nasopharyngeal Nasopharyngeal Swab      Status: None   Collection Time: 07/15/20 11:37 AM   Specimen: Nasopharyngeal Swab  Result Value Ref Range Status   SARS Coronavirus 2 NEGATIVE NEGATIVE Final    Comment: (NOTE) SARS-CoV-2 target nucleic acids are NOT DETECTED.  The SARS-CoV-2 RNA is generally detectable in upper and lower respiratory specimens during the acute phase of infection. The lowest concentration of SARS-CoV-2 viral copies this assay can detect is 250 copies / mL. A negative result does not preclude SARS-CoV-2 infection and should not be used as the sole basis for treatment or other patient management decisions.  A negative result may occur with improper specimen collection / handling, submission of specimen other than nasopharyngeal swab, presence of viral mutation(s) within  the areas targeted by this assay, and inadequate number of viral copies (<250 copies / mL). A negative result must be combined with clinical observations, patient history, and epidemiological information.  Fact Sheet for Patients:   StrictlyIdeas.no  Fact Sheet for Healthcare Providers: BankingDealers.co.za  This test is not yet approved or  cleared by the [place] FDA and has been authorized for detection and/or diagnosis of SARS-CoV-2 by FDA under an Emergency Use Authorization (EUA).  This EUA will remain in effect (meaning this test can be used) for the duration of the COVID-19 declaration under Section 564(b)(1) of the Act, 21 U.S.C. section 360bbb-3(b)(1), unless the authorization is terminated or revoked sooner.  Performed at [facility], [place] [address] [place], [place] [address]       Radiology Studies: ECHOCARDIOGRAM COMPLETE  Result Date: 07/17/2020    ECHOCARDIOGRAM REPORT   Patient Name:   Amber Roberts Date of Exam: 07/17/2020 Medical Rec #:  [id number]         Height:       66.5 in Accession #:    [id number]        Weight:       104.5 lb  Date of Birth:  06-19-1961         BSA:          1.526 m Patient Age:    54 years          BP:           152/83 mmHg Patient Gender: F                 HR:           80 bpm. Exam Location:  Inpatient Procedure: 2D Echo Indications:    chest pain 786.50  History:        Patient has no prior history of Echocardiogram examinations.                 Previous Myocardial Infarction; Risk Factors:Dyslipidemia and                 Current Smoker.  Sonographer:    Jannett Celestine RDCS (AE) Referring Phys: [phone] [facility]  Sonographer Comments: Suboptimal parasternal window. very low parasternal window IMPRESSIONS  1. Left ventricular ejection fraction, by estimation, is 60 to 65%. The left ventricle has normal function. The left ventricle has no regional wall motion abnormalities. Left ventricular diastolic parameters are consistent with Grade II diastolic dysfunction (pseudonormalization). Elevated left atrial pressure.  2. Right ventricular systolic function is normal. The right ventricular size is normal. Tricuspid regurgitation signal is inadequate for assessing PA pressure.  3. The mitral valve is normal in structure. Mild to moderate mitral valve regurgitation. No evidence of mitral stenosis.  4. The aortic valve is normal in structure. Aortic valve regurgitation is not visualized. No aortic stenosis is present.  5. The inferior vena cava is dilated in size with <50% respiratory variability, suggesting right atrial pressure of 15 mmHg. FINDINGS  Left Ventricle: Left ventricular ejection fraction, by estimation, is 60 to 65%. The left ventricle has normal function. The left ventricle has no regional wall motion abnormalities. The left ventricular internal cavity size was normal in size. There is  no left ventricular hypertrophy. Left ventricular diastolic parameters are consistent with Grade II diastolic dysfunction (pseudonormalization). Elevated left atrial pressure. Right Ventricle: The right ventricular size is  normal. No increase in right ventricular wall thickness. Right ventricular systolic function is normal. Tricuspid  regurgitation signal is inadequate for assessing PA pressure. Left Atrium: Left atrial size was normal in size. Right Atrium: Right atrial size was normal in size. Pericardium: There is no evidence of pericardial effusion. Mitral Valve: The mitral valve is normal in structure. There is mild thickening of the mitral valve leaflet(s). Mild to moderate mitral valve regurgitation, with centrally-directed jet. No evidence of mitral valve stenosis. Tricuspid Valve: The tricuspid valve is normal in structure. Tricuspid valve regurgitation is not demonstrated. No evidence of tricuspid stenosis. Aortic Valve: The aortic valve is normal in structure. Aortic valve regurgitation is not visualized. No aortic stenosis is present. Pulmonic Valve: The pulmonic valve was normal in structure. Pulmonic valve regurgitation is not visualized. No evidence of pulmonic stenosis. Aorta: The aortic root is normal in size and structure. Venous: The inferior vena cava is dilated in size with less than 50% respiratory variability, suggesting right atrial pressure of 15 mmHg. IAS/Shunts: No atrial level shunt detected by color flow Doppler.  LEFT VENTRICLE PLAX 2D LVIDd:         3.70 cm  Diastology LVIDs:         2.50 cm  LV e' medial:    7.29 cm/s LV PW:         0.90 cm  LV E/e' medial:  16.0 LV IVS:        0.80 cm  LV e' lateral:   9.57 cm/s LVOT diam:     1.80 cm  LV E/e' lateral: 12.2 LV SV:         49 LV SV Index:   32 LVOT Area:     2.54 cm  RIGHT VENTRICLE RV S prime:     14.70 cm/s TAPSE (M-mode): 1.4 cm LEFT ATRIUM           Index LA diam:      3.00 cm 1.97 cm/m LA Vol (A2C): 26.1 ml 17.10 ml/m LA Vol (A4C): 21.3 ml 13.96 ml/m  AORTIC VALVE LVOT Vmax:   98.80 cm/s LVOT Vmean:  68.200 cm/s LVOT VTI:    0.191 m  AORTA Ao Root diam: 2.40 cm MITRAL VALVE MV Area (PHT): 2.83 cm     SHUNTS MV Decel Time: 268 msec     Systemic  VTI:  0.19 m MR Peak grad: 130.4 mmHg    Systemic Diam: 1.80 cm MR Mean grad: 79.0 mmHg MR Vmax:      571.00 cm/s MR Vmean:     423.0 cm/s MV E velocity: 117.00 cm/s MV A velocity: 92.40 cm/s MV E/A ratio:  1.27 Mihai Croitoru MD Electronically signed by Sanda Klein MD Signature Date/Time: 07/17/2020/9:52:08 AM    Final     Scheduled Meds: . enoxaparin (LOVENOX) injection  40 mg Subcutaneous Q24H  . metoprolol tartrate  12.5 mg Oral BID  . pantoprazole (PROTONIX) IV  40 mg Intravenous Q24H   Continuous Infusions: . lactated ringers with kcl 75 mL/hr at 07/18/20 1207     LOS: 3 days   Time spent: 25 minutes.  Patrecia Pour, MD [facility] [email or web address] 07/18/2020, 6:29 PM

## 2020-07-19 LAB — BASIC METABOLIC PANEL
Anion gap: 8 (ref 5–15)
BUN: <5 mg/dL — ABNORMAL LOW (ref 6–20)
CO2: 26 mmol/L (ref 22–32)
Calcium: 8.4 mg/dL — ABNORMAL LOW (ref 8.9–10.3)
Chloride: 95 mmol/L — ABNORMAL LOW (ref 98–111)
Creatinine, Ser: 0.47 mg/dL (ref 0.44–1.00)
GFR calc Af Amer: >60 mL/min (ref >60)
GFR calc non Af Amer: >60 mL/min (ref >60)
Glucose, Bld: 83 mg/dL (ref 70–99)
Potassium: 3.5 mmol/L (ref 3.5–5.1)
Sodium: 129 mmol/L — ABNORMAL LOW (ref 135–145)

## 2020-07-19 LAB — MAGNESIUM: Magnesium: 2 mg/dL (ref 1.7–2.4)

## 2020-07-19 MED ORDER — FUROSEMIDE 10 MG/ML IJ SOLN
40.0000 mg | Freq: Once | INTRAMUSCULAR | Status: AC
  Administered 2020-07-19: 40 mg via INTRAVENOUS
  Filled 2020-07-19: qty 4

## 2020-07-19 MED ORDER — IBUPROFEN 200 MG PO TABS
600.0000 mg | ORAL_TABLET | Freq: Four times a day (QID) | ORAL | Status: DC | PRN
  Administered 2020-07-19 – 2020-07-20 (×4): 600 mg via ORAL
  Filled 2020-07-19 (×4): qty 3

## 2020-07-19 MED ORDER — ACETAMINOPHEN 325 MG PO TABS: 650.0000 mg | ORAL_TABLET | ORAL | Status: DC | PRN

## 2020-07-19 MED ORDER — POTASSIUM CHLORIDE CRYS ER 20 MEQ PO TBCR
40.0000 meq | EXTENDED_RELEASE_TABLET | Freq: Once | ORAL | Status: AC
  Administered 2020-07-19: 40 meq via ORAL
  Filled 2020-07-19: qty 2

## 2020-07-19 NOTE — Progress Notes (Signed)
PROGRESS NOTE  Amber Roberts  ONG:[id number] DOB: Sep 01, 1961 DOA: 07/15/2020 PCP: [name], NP   Brief Narrative: Amber Roberts is a 59 y.o. female with a history of cervical CA, gout, fibromyalgia, tobacco use who presented on 9/13 with chest/upper abdominal pain with nausea and intractable vomiting. On presentation, she had elevated d-dimer and mildly elevated troponin. CT angiogram showed no findings of pulmonary embolism but lungs were hyperinflated and inflammatory changes around the pancreatic tail and near the spleen. Lipase was 654. She was admitted with her first episode of acute pancreatitis and chest pain with cardiology consultation.  Assessment & Plan: Principal Problem:   Acute pancreatitis Active Problems:   GERD (gastroesophageal reflux disease)   CAD (coronary artery disease)   Nicotine dependence  Acute pancreatitis: Has persistent but not abusive alcohol intake, long time smoking hx, no evidence of gallstones, triglycerides wnl, and not recent started on typical provocative agents.  - Tolerating po. Wean opioids.  - Continue antiemetics  - Would recommend repeat dedicated imaging following resolution of this episode to rule out underlying mass.  - Alcohol cessation and smoking cessation counseling provided.   Chest pain: No calcifications. No regional wall motion abnormalities on echo. - Cardiology started beta blocker. Defer ischemic evaluation.  Acute hypoxemic respiratory failure: Beginning to have dyspnea, orthopnea, had elevated filling pressures on echo and got IVF w/pancreatitis.  - Stop IVF, give lasix x1.  - Ambulatory pulse oximetry in AM.   Chronic pain: Exacerbated by hospital bed. Responsive to antiinflammatories usually.  - To minimize opioid use, will give tylenol (preferred) and/or ibuprofen as needed.   Tobacco use:  - Cessation counseling provided  Hypokalemia:  - Supplement again today, po. Mg ok. Will monitor in AM w/lasix.    Hypomagnesemia:  - Supplemented, recheck in AM.   Underweight:  - Dietitian consulted.  - Advancing diet as tolerated.   Hyponatremia:  - Continue monitoring.   Macrocytic anemia: Folic acid, G40 wnl. Iron studies consistent with inflammatory state.  - Monitor  Thrombocytopenia: Stabilizing. IPF is wnl.  - Ok to continue lovenox at this time.   Dyslipidemia:  - LDL 100, HDL 37.   DVT prophylaxis: Lovenox Code Status: Full Family Communication: None at bedside Disposition Plan:  Status is: Inpatient  Remains inpatient appropriate because:IV treatments appropriate due to intensity of illness or inability to take PO. New hypoxemia   Dispo: The patient is from: Home              Anticipated d/c is to: Home              Anticipated d/c date is: 1 day              Patient currently is not medically stable to d/c.  Consultants:   Cardiology  Procedures:   Echocardiogram   Antimicrobials:  None   Subjective: Abdominal pain resolved, tolerating soft diet without significant nausea. Pain across shoulders/back is chronic but acutely worse with laying in bed. She's laying on 3 pillows, having gradual onset of shortness of breath over the past 24 hours. No leg or other swelling.  Objective: Vitals:   07/18/20 1312 07/18/20 2013 07/19/20 0541 07/19/20 0848  BP: (!) 141/80 (!) 153/92 (!) 155/87 (!) 171/94  Pulse: 80 87 99 88  Resp:  16 16   Temp: 99 F (37.2 C) 99.1 F (37.3 C) 99.3 F (37.4 C)   TempSrc: Oral Oral Oral   SpO2: 96% 97% (!) 84% 94%  Weight:  Height:        Intake/Output Summary (Last 24 hours) at 07/19/2020 1254 Last data filed at 07/19/2020 0758 Gross per 24 hour  Intake 2209.71 ml  Output --  Net 2209.71 ml   Filed Weights   07/15/20 0755 07/15/20 2200  Weight: 47.6 kg 47.4 kg   Gen: 59 y.o. female in no distress Pulm: Nonlabored but tachypneic, mid-80%'s when supine. Crackles at bases. CV: Regular rate and rhythm. No murmur, rub,  or gallop. No JVD, no pitting dependent edema. GI: Abdomen soft, not very tender, non-distended, with normoactive bowel sounds.  Ext: Warm, no deformities Skin: No rashes, lesions or ulcers on visualized skin. Neuro: Alert and oriented. No focal neurological deficits. Psych: Judgement and insight appear fair. Mood euthymic & affect congruent. Behavior is appropriate.    Data Reviewed: I have personally reviewed following labs and imaging studies  CBC: Recent Labs  Lab 07/15/20 0852 07/16/20 0432 07/17/20 0435 07/18/20 0454  WBC 10.4 7.4 7.1 8.2  NEUTROABS 9.4*  --  5.5  --   HGB 14.8 12.0 10.3* 10.1*  HCT 42.6 34.6* 30.7* 28.8*  MCV 103.4* 104.8* 108.5* 105.9*  PLT 156 124* 95* 97*   Basic Metabolic Panel: Recent Labs  Lab 07/15/20 0852 07/16/20 0432 07/17/20 0435 07/18/20 0454 07/19/20 0514  NA 135 128* 131* 127* 129*  K 3.8 3.7 3.0* 3.1* 3.5  CL 98 95* 101 95* 95*  CO2 21* 25 24 24 26   GLUCOSE 151* 96 92 73 83  BUN 7 7 5* <5* <5*  CREATININE 0.60 0.42* 0.39* 0.41* 0.47  CALCIUM 9.6 8.2* 7.8* 8.2* 8.4*  MG  --   --  1.5*  --  2.0   GFR: Estimated Creatinine Clearance: 58.1 mL/min (by C-G formula based on SCr of 0.47 mg/dL). Liver Function Tests: Recent Labs  Lab 07/15/20 0852 07/16/20 0432 07/17/20 0435 07/18/20 0454  AST 75* 35 26 30  ALT 25 16 12 15   ALKPHOS 111 72 71 101  BILITOT 2.2* 1.7* 1.8* 2.3*  PROT 8.0 6.0* 5.7* 5.8*  ALBUMIN 4.7 3.4* 2.9* 3.0*   Recent Labs  Lab 07/15/20 1111  LIPASE 654*   Lipid Profile: Recent Labs    07/17/20 0435  CHOL 154  HDL 37*  LDLCALC 100*  TRIG 84  CHOLHDL 4.2   Thyroid Function Tests: No results for input(s): TSH, T4TOTAL, FREET4, T3FREE, THYROIDAB in the last 72 hours. Anemia Panel: Recent Labs    07/18/20 0454  VITAMINB12 467  FOLATE 8.5  FERRITIN 318*  TIBC 224*  IRON 29  RETICCTPCT 1.4   Urine analysis:    Component Value Date/Time   BILIRUBINUR Negative 05/31/2020 1000   PROTEINUR  Negative 05/31/2020 1000   UROBILINOGEN 0.2 05/31/2020 1000   NITRITE Negative 05/31/2020 1000   LEUKOCYTESUR Negative 05/31/2020 1000   Recent Results (from the past 240 hour(s))  SARS Coronavirus 2 by RT PCR (hospital order, performed in [place] hospital lab) Nasopharyngeal Nasopharyngeal Swab     Status: None   Collection Time: 07/15/20 11:37 AM   Specimen: Nasopharyngeal Swab  Result Value Ref Range Status   SARS Coronavirus 2 NEGATIVE NEGATIVE Final    Comment: (NOTE) SARS-CoV-2 target nucleic acids are NOT DETECTED.  The SARS-CoV-2 RNA is generally detectable in upper and lower respiratory specimens during the acute phase of infection. The lowest concentration of SARS-CoV-2 viral copies this assay can detect is 250 copies / mL. A negative result does not preclude SARS-CoV-2 infection and should  not be used as the sole basis for treatment or other patient management decisions.  A negative result may occur with improper specimen collection / handling, submission of specimen other than nasopharyngeal swab, presence of viral mutation(s) within the areas targeted by this assay, and inadequate number of viral copies (<250 copies / mL). A negative result must be combined with clinical observations, patient history, and epidemiological information.  Fact Sheet for Patients:   StrictlyIdeas.no  Fact Sheet for Healthcare Providers: BankingDealers.co.za  This test is not yet approved or  cleared by the [place] FDA and has been authorized for detection and/or diagnosis of SARS-CoV-2 by FDA under an Emergency Use Authorization (EUA).  This EUA will remain in effect (meaning this test can be used) for the duration of the COVID-19 declaration under Section 564(b)(1) of the Act, 21 U.S.C. section 360bbb-3(b)(1), unless the authorization is terminated or revoked sooner.  Performed at [facility], [place] [address], [place], [place] [address]       Radiology Studies: No results found.  Scheduled Meds:  enoxaparin (LOVENOX) injection  40 mg Subcutaneous Q24H   metoprolol tartrate  12.5 mg Oral BID   pantoprazole (PROTONIX) IV  40 mg Intravenous Q24H   Continuous Infusions:    LOS: 4 days   Time spent: 25 minutes.  Patrecia Pour, MD [facility] [email or web address] 07/19/2020, 12:54 PM

## 2020-07-20 ENCOUNTER — Encounter (HOSPITAL_COMMUNITY): Payer: Self-pay | Admitting: Internal Medicine

## 2020-07-20 LAB — BASIC METABOLIC PANEL
Anion gap: 11 (ref 5–15)
BUN: <5 mg/dL — ABNORMAL LOW (ref 6–20)
CO2: 28 mmol/L (ref 22–32)
Calcium: 8.7 mg/dL — ABNORMAL LOW (ref 8.9–10.3)
Chloride: 93 mmol/L — ABNORMAL LOW (ref 98–111)
Creatinine, Ser: 0.52 mg/dL (ref 0.44–1.00)
GFR calc Af Amer: >60 mL/min (ref >60)
GFR calc non Af Amer: >60 mL/min (ref >60)
Glucose, Bld: 108 mg/dL — ABNORMAL HIGH (ref 70–99)
Potassium: 3.3 mmol/L — ABNORMAL LOW (ref 3.5–5.1)
Sodium: 132 mmol/L — ABNORMAL LOW (ref 135–145)

## 2020-07-20 MED ORDER — METOPROLOL TARTRATE 25 MG PO TABS
12.5000 mg | ORAL_TABLET | Freq: Two times a day (BID) | ORAL | 2 refills | Status: DC
Start: 2020-07-20 — End: 2021-08-29

## 2020-07-20 MED ORDER — IBUPROFEN 600 MG PO TABS
600.0000 mg | ORAL_TABLET | Freq: Four times a day (QID) | ORAL | 0 refills | Status: DC | PRN
Start: 2020-07-20 — End: 2021-07-14

## 2020-07-20 MED ORDER — POTASSIUM CHLORIDE CRYS ER 20 MEQ PO TBCR
40.0000 meq | EXTENDED_RELEASE_TABLET | Freq: Once | ORAL | Status: AC
  Administered 2020-07-20: 40 meq via ORAL
  Filled 2020-07-20: qty 2

## 2020-07-20 MED ORDER — POTASSIUM CHLORIDE ER 10 MEQ PO TBCR: 10.0000 meq | EXTENDED_RELEASE_TABLET | Freq: Every day | ORAL | 0 refills | Status: DC

## 2020-07-20 MED ORDER — OXYCODONE HCL 5 MG PO TABS
5.0000 mg | ORAL_TABLET | ORAL | Status: DC | PRN
  Administered 2020-07-20: 5 mg via ORAL
  Filled 2020-07-20: qty 1

## 2020-07-20 MED ORDER — ONDANSETRON HCL 4 MG PO TABS: 4.0000 mg | ORAL_TABLET | Freq: Four times a day (QID) | ORAL | 0 refills | Status: DC | PRN

## 2020-07-20 NOTE — Plan of Care (Signed)

## 2020-07-20 NOTE — Progress Notes (Addendum)
Patient given discharge, follow up, and medication instructions, verbalized understanding, IV and telemetry monitor removed, personal belongings wit patient, family to transport home, patient stated she wanted to walk out vs wheelchair with family, no staff assist needed

## 2020-07-20 NOTE — Discharge Summary (Signed)
Physician Discharge Summary  Amber Roberts ENI:[id number] DOB: Nov 17, 1960 DOA: 07/15/2020  PCP: [name], NP  Admit date: 07/15/2020 Discharge date: 07/20/2020  Admitted From: Home  Discharge disposition: Home    Recommendations for Outpatient Follow-Up:   . Follow up with your primary care provider in one week.  . Check CBC, BMP, magnesium in the next visit . Patient did have elevated MCV and low platelets.  Please follow-up with methylmalonic acid and platelet count on the next visit.   Discharge Diagnosis:   Principal Problem:   Acute pancreatitis Active Problems:   GERD (gastroesophageal reflux disease)   CAD (coronary artery disease)   Nicotine dependence   Discharge Condition: Improved.  Diet recommendation: Low sodium, heart healthy.  Wound care: None.  Code status: Full.   History of Present Illness:   Amber Roberts is a 59 y.o. female with a history of cervical CA, gout, fibromyalgia, tobacco use presented to the hospital on 9/13 with chest/upper abdominal pain with nausea and intractable vomiting. On presentation, she had elevated d-dimer and mildly elevated troponin.CT angiogram showed no findings of pulmonary embolism but lungs were hyperinflated and inflammatory changes around the pancreatic tail and near the spleen. Lipase was 654. She was admitted with her first episode of acute pancreatitis and chest pain with cardiology consultation.   Hospital Course:   Following conditions were addressed during hospitalization as listed below,  Acute pancreatitis: Likely alcohol related.  Triglyceride within normal limits.  No evidence of gallstones.  Patient was counseled on cessation of alcohol.  Patient was managed conservatively and improved.   Chest pain: Atypical.  2D echocardiogram no regional wall motion abnormalities .  Cardiology was consulted and recommended beta-blockers  Acute hypoxemic respiratory failure: Transient secondary to  fluid overload.  Improved with IV Lasix.  Off oxygen at this time.   Chronic pain: On NSAIDs as needed.  Tobacco use:  Encouraged cessation on discharge.  Hypokalemia:  Mild.  Potassium 3.3 today.  Received 40 mEq today.  Will give 10 milliequivalents daily for the next 2 weeks.  Recheck with PCP.  Hypomagnesemia:  Resolved with replacement.  Hyponatremia:  Mild.  Improved.  Will need outpatient follow-up  Macrocytic anemia: Folic acid, I14 wnl. Iron studies consistent with inflammatory state.  Possibility of vitamin B12 deficiency , would recommend checking methylmalonic acid as outpatient.  Thrombocytopenia: Mild.  Follow-up as outpatient.  No evidence of bleeding or petechia..  Dyslipidemia:  - LDL 100, HDL 37.   Disposition.  At this time, patient is stable for disposition home.  Will need to follow-up with your primary care physician as outpatient.  Medical Consultants:    None.  Procedures:    None Subjective:   Today, patient was seen and examined at bedside.  Denies any nausea, vomiting, abdominal pain.  Discharge Exam:   Vitals:   07/19/20 2047 07/20/20 0522  BP: 140/89 (!) 151/90  Pulse: 77 76  Resp: 14 16  Temp: 98.7 F (37.1 C) 98.2 F (36.8 C)  SpO2: 97% 98%   Vitals:   07/19/20 0848 07/19/20 1556 07/19/20 2047 07/20/20 0522  BP: (!) 171/94 (!) 142/95 140/89 (!) 151/90  Pulse: 88 89 77 76  Resp:  16 14 16   Temp:  98.1 F (36.7 C) 98.7 F (37.1 C) 98.2 F (36.8 C)  TempSrc:  Oral Oral Oral  SpO2: 94% 94% 97% 98%  Weight:      Height:       General: Alert awake, not in  obvious distress, thinly built HENT: pupils equally reacting to light,  No scleral pallor or icterus noted. Oral mucosa is moist.  Chest:  Clear breath sounds.  Diminished breath sounds bilaterally. No crackles or wheezes.  CVS: S1 &S2 heard. No murmur.  Regular rate and rhythm. Abdomen: Soft, nontender, nondistended.  Bowel sounds are heard.   Extremities: No  cyanosis, clubbing or edema.  Peripheral pulses are palpable. Psych: Alert, awake and oriented, normal mood CNS:  No cranial nerve deficits.  Power equal in all extremities.   Skin: Warm and dry.  No rashes noted.  The results of significant diagnostics from this hospitalization (including imaging, microbiology, ancillary and laboratory) are listed below for reference.     Diagnostic Studies:   CT Angio Chest PE W and/or Wo Contrast  Result Date: 07/15/2020 CLINICAL DATA:  Chest pain since last evening. Positive D-dimer. EXAM: CT ANGIOGRAPHY CHEST WITH CONTRAST TECHNIQUE: Multidetector CT imaging of the chest was performed using the standard protocol during bolus administration of intravenous contrast. Multiplanar CT image reconstructions and MIPs were obtained to evaluate the vascular anatomy. CONTRAST:  72mL OMNIPAQUE IOHEXOL 350 MG/ML SOLN COMPARISON:  09/23/2018 FINDINGS: Cardiovascular: The heart is normal in size. No pericardial effusion. Mild tortuosity of the thoracic aorta but no aneurysm or dissection. The branch vessels are patent. No definite coronary artery calcifications. The pulmonary arterial tree is well opacified. No filling defects to suggest pulmonary embolism. Mediastinum/Nodes: No mediastinal or hilar mass or adenopathy. The esophagus is grossly normal. The thyroid gland is unremarkable. Lungs/Pleura: The lungs are clear of an acute process. No pulmonary lesions or pleural effusions. Upper Abdomen: There appears to be some inflammatory type changes around the pancreatic tail and near the spleen. There is a small amount of fluid in the left anterior pararenal space. Recommend correlation with any symptoms of pancreatitis. Correlation with amylase level may be helpful. Musculoskeletal: No breast masses, supraclavicular or axillary adenopathy. No significant bony findings. Review of the MIP images confirms the above findings. IMPRESSION: 1. No CT findings for pulmonary embolism. 2.  Normal thoracic aorta. 3. No acute pulmonary findings. 4. Inflammatory type changes around the pancreatic tail and near the spleen. Recommend correlation with any symptoms of pancreatitis. Correlation with amylase level may be helpful. Electronically Signed   By: Marijo Sanes M.D.   On: 07/15/2020 12:11   DG Chest Portable 1 View  Result Date: 07/15/2020 CLINICAL DATA:  Chest pain. EXAM: PORTABLE CHEST 1 VIEW COMPARISON:  10/20/2011 FINDINGS: The heart size and mediastinal contours are within normal limits. Stable bilateral pulmonary hyperinflation and emphysematous disease. There is no evidence of pulmonary edema, consolidation, pneumothorax, nodule or pleural fluid. The visualized skeletal structures are unremarkable. IMPRESSION: Stable emphysematous lung disease. Electronically Signed   By: Aletta Edouard M.D.   On: 07/15/2020 08:33   US Abdomen Limited RUQ  Result Date: 07/16/2020 CLINICAL DATA:  Acute pancreatitis EXAM: ULTRASOUND ABDOMEN LIMITED RIGHT UPPER QUADRANT COMPARISON:  Chest CT including portions of the upper abdomen July 15, 2020 FINDINGS: Gallbladder: No gallstones or wall thickening visualized. There is no pericholecystic fluid. No sonographic Murphy sign noted by sonographer. Common bile duct: Diameter: 7 mm, upper normal. No intrahepatic, common hepatic, or common bile duct dilatation. Liver: No focal liver lesions. Liver echogenicity is mildly inhomogeneous which may indicate foci of fatty infiltration. Portal vein is patent on color Doppler imaging with normal direction of blood flow towards the liver. Other: Mild ascites. IMPRESSION: 1.  Mild ascites. 2. Inhomogeneous echotexture in  the liver, a finding likely indicative of focal fatty infiltration. No focal liver lesions evident. 3.  Study otherwise unremarkable. Electronically Signed   By: Lowella Grip III M.D.   On: 07/16/2020 16:38     Labs:   Basic Metabolic Panel: Recent Labs  Lab 07/16/20 0432  07/16/20 0432 07/17/20 0435 07/17/20 0435 07/18/20 0454 07/18/20 0454 07/19/20 0514 07/20/20 0557  NA 128*  --  131*  --  127*  --  129* 132*  K 3.7   < > 3.0*   < > 3.1*   < > 3.5 3.3*  CL 95*  --  101  --  95*  --  95* 93*  CO2 25  --  24  --  24  --  26 28  GLUCOSE 96  --  92  --  73  --  83 108*  BUN 7  --  5*  --  <5*  --  <5* <5*  CREATININE 0.42*  --  0.39*  --  0.41*  --  0.47 0.52  CALCIUM 8.2*  --  7.8*  --  8.2*  --  8.4* 8.7*  MG  --   --  1.5*  --   --   --  2.0  --    < > = values in this interval not displayed.   GFR Estimated Creatinine Clearance: 58.1 mL/min (by C-G formula based on SCr of 0.52 mg/dL). Liver Function Tests: Recent Labs  Lab 07/15/20 0852 07/16/20 0432 07/17/20 0435 07/18/20 0454  AST 75* 35 26 30  ALT 25 16 12 15   ALKPHOS 111 72 71 101  BILITOT 2.2* 1.7* 1.8* 2.3*  PROT 8.0 6.0* 5.7* 5.8*  ALBUMIN 4.7 3.4* 2.9* 3.0*   Recent Labs  Lab 07/15/20 1111  LIPASE 654*   No results for input(s): AMMONIA in the last 168 hours. Coagulation profile No results for input(s): INR, PROTIME in the last 168 hours.  CBC: Recent Labs  Lab 07/15/20 0852 07/16/20 0432 07/17/20 0435 07/18/20 0454  WBC 10.4 7.4 7.1 8.2  NEUTROABS 9.4*  --  5.5  --   HGB 14.8 12.0 10.3* 10.1*  HCT 42.6 34.6* 30.7* 28.8*  MCV 103.4* 104.8* 108.5* 105.9*  PLT 156 124* 95* 97*   Cardiac Enzymes: No results for input(s): CKTOTAL, CKMB, CKMBINDEX, TROPONINI in the last 168 hours. BNP: Invalid input(s): POCBNP CBG: No results for input(s): GLUCAP in the last 168 hours. D-Dimer No results for input(s): DDIMER in the last 72 hours. Hgb A1c No results for input(s): HGBA1C in the last 72 hours. Lipid Profile No results for input(s): CHOL, HDL, LDLCALC, TRIG, CHOLHDL, LDLDIRECT in the last 72 hours. Thyroid function studies No results for input(s): TSH, T4TOTAL, T3FREE, THYROIDAB in the last 72 hours.  Invalid input(s): FREET3 Anemia work up Recent Labs     07/18/20 0454  VITAMINB12 467  FOLATE 8.5  FERRITIN 318*  TIBC 224*  IRON 29  RETICCTPCT 1.4   Microbiology Recent Results (from the past 240 hour(s))  SARS Coronavirus 2 by RT PCR (hospital order, performed in [facility] hospital lab) Nasopharyngeal Nasopharyngeal Swab     Status: None   Collection Time: 07/15/20 11:37 AM   Specimen: Nasopharyngeal Swab  Result Value Ref Range Status   SARS Coronavirus 2 NEGATIVE NEGATIVE Final    Comment: (NOTE) SARS-CoV-2 target nucleic acids are NOT DETECTED.  The SARS-CoV-2 RNA is generally detectable in upper and lower respiratory specimens during the acute phase of infection.  The lowest concentration of SARS-CoV-2 viral copies this assay can detect is 250 copies / mL. A negative result does not preclude SARS-CoV-2 infection and should not be used as the sole basis for treatment or other patient management decisions.  A negative result may occur with improper specimen collection / handling, submission of specimen other than nasopharyngeal swab, presence of viral mutation(s) within the areas targeted by this assay, and inadequate number of viral copies (<250 copies / mL). A negative result must be combined with clinical observations, patient history, and epidemiological information.  Fact Sheet for Patients:   StrictlyIdeas.no  Fact Sheet for Healthcare Providers: BankingDealers.co.za  This test is not yet approved or  cleared by the [place] FDA and has been authorized for detection and/or diagnosis of SARS-CoV-2 by FDA under an Emergency Use Authorization (EUA).  This EUA will remain in effect (meaning this test can be used) for the duration of the COVID-19 declaration under Section 564(b)(1) of the Act, 21 U.S.C. section 360bbb-3(b)(1), unless the authorization is terminated or revoked sooner.  Performed at [facility], [place] [address], [place], [place]  [address]      Discharge Instructions:   Discharge Instructions    Diet - low sodium heart healthy   Complete by: As directed    Discharge instructions   Complete by: As directed    Follow up with your primary care provider in one week. Take medications as prescribed. NO  smoking or alcohol please.   Increase activity slowly   Complete by: As directed      Allergies as of 07/20/2020      Reactions   Darvon [propoxyphene Hcl] Nausea And Vomiting      Medication List    STOP taking these medications   amoxicillin-clavulanate 875-125 MG tablet Commonly known as: AUGMENTIN     TAKE these medications   ibuprofen 600 MG tablet Commonly known as: ADVIL Take 1 tablet (600 mg total) by mouth every 6 (six) hours as needed (musculoskeletal pain).   metoprolol tartrate 25 MG tablet Commonly known as: LOPRESSOR Take 0.5 tablets (12.5 mg total) by mouth 2 (two) times daily.   mirtazapine 15 MG tablet Commonly known as: Remeron Take 0.5-1 tablets (7.5-15 mg total) by mouth at bedtime. For sleep and appetite.   ondansetron 4 MG tablet Commonly known as: ZOFRAN Take 1 tablet (4 mg total) by mouth every 6 (six) hours as needed for nausea.   potassium chloride 10 MEQ tablet Commonly known as: KLOR-CON Take 1 tablet (10 mEq total) by mouth daily for 14 days.   Prevacid 15 MG capsule Generic drug: lansoprazole Take 15 mg by mouth daily.   Vitamin D (Ergocalciferol) 1.25 MG (50000 UNIT) Caps capsule Commonly known as: DRISDOL Take 1 capsule by mouth once weekly.       Follow-up Information    [name], NP. Schedule an appointment as soon as possible for a visit.   Specialty: Internal Medicine Why: for blood work checkup, blood pressure checkup Contact information: [place] [address] [phone]        Minus Breeding, MD .   Specialty: Cardiology Contact information: [address] [place] [address] [phone]                 Time coordinating discharge: 39 minutes  Signed:  Ormond Lazo  [facility] 07/20/2020, 10:03 AM

## 2020-07-22 ENCOUNTER — Telehealth: Payer: Self-pay

## 2020-07-22 ENCOUNTER — Telehealth: Payer: Self-pay | Admitting: *Deleted

## 2020-07-22 NOTE — Telephone Encounter (Signed)
1st attempt- Left message on voicemail to return my call. Need to complete TCM. Follow up visit scheduled for 07/25/2020 @ 11:20 am.

## 2020-07-22 NOTE — Telephone Encounter (Signed)
Patient left a voicemail stating that she called this morning to get a hospital follow-up for pancreatis with [name] NP and was not able to get an appointment until Thursday 07/25/20. Patient stated that she had planned on going back to work today but was not able to because she is still having pain. Patient stated that her boss wants to know when she will be able to come back to work. Patient stated that she is thinking about going back tomorrow for a 1/2 a day. Patient wants to know if this should be okay or what is recommended?

## 2020-07-22 NOTE — Telephone Encounter (Signed)
This needs to wait for pcp Looks like she is back tomorrow I will cc her

## 2020-07-23 NOTE — Telephone Encounter (Signed)
2nd/final attempt- Left message on voicemail to return my call. Need to complete TCM. Follow up visit scheduled for 07/25/2020 @ 11:20 am.

## 2020-07-23 NOTE — Telephone Encounter (Signed)
Left message to return call to our office.  

## 2020-07-23 NOTE — Telephone Encounter (Signed)
If she's feeling fine to return to work that's okay with me, but I would like to see her in the office for follow up as scheduled.

## 2020-07-25 ENCOUNTER — Other Ambulatory Visit: Payer: Self-pay

## 2020-07-25 ENCOUNTER — Ambulatory Visit: Payer: 59 | Admitting: Primary Care

## 2020-07-25 ENCOUNTER — Encounter: Payer: Self-pay | Admitting: Primary Care

## 2020-07-25 VITALS — BP 138/82 | HR 81 | Temp 96.2°F | Ht 66.5 in | Wt 108.0 lb

## 2020-07-25 DIAGNOSIS — K859 Acute pancreatitis without necrosis or infection, unspecified: Secondary | ICD-10-CM

## 2020-07-25 DIAGNOSIS — F17200 Nicotine dependence, unspecified, uncomplicated: Secondary | ICD-10-CM

## 2020-07-25 DIAGNOSIS — R1010 Upper abdominal pain, unspecified: Secondary | ICD-10-CM

## 2020-07-25 DIAGNOSIS — K3580 Unspecified acute appendicitis: Secondary | ICD-10-CM | POA: Diagnosis not present

## 2020-07-25 DIAGNOSIS — D539 Nutritional anemia, unspecified: Secondary | ICD-10-CM

## 2020-07-25 HISTORY — DX: Nutritional anemia, unspecified: D53.9

## 2020-07-25 LAB — BASIC METABOLIC PANEL
BUN: 4 mg/dL — ABNORMAL LOW (ref 6–23)
CO2: 29 mEq/L (ref 19–32)
Calcium: 9.3 mg/dL (ref 8.4–10.5)
Chloride: 99 mEq/L (ref 96–112)
Creatinine, Ser: 0.58 mg/dL (ref 0.40–1.20)
GFR: 106.92 mL/min (ref >60.00)
Glucose, Bld: 92 mg/dL (ref 70–99)
Potassium: 4.2 mEq/L (ref 3.5–5.1)
Sodium: 134 mEq/L — ABNORMAL LOW (ref 135–145)

## 2020-07-25 LAB — CBC
HCT: 31.1 % — ABNORMAL LOW (ref 36.0–46.0)
Hemoglobin: 10.2 g/dL — ABNORMAL LOW (ref 12.0–15.0)
MCHC: 33 g/dL (ref 30.0–36.0)
MCV: 106.5 fl — ABNORMAL HIGH (ref 78.0–100.0)
Platelets: 321 10*3/uL (ref 150.0–400.0)
RBC: 2.92 Mil/uL — ABNORMAL LOW (ref 3.87–5.11)
RDW: 16.8 % — ABNORMAL HIGH (ref 11.5–15.5)
WBC: 7 10*3/uL (ref 4.0–10.5)

## 2020-07-25 LAB — LIPASE: Lipase: 172 U/L — ABNORMAL HIGH (ref 11.0–59.0)

## 2020-07-25 LAB — MAGNESIUM: Magnesium: 1.7 mg/dL (ref 1.5–2.5)

## 2020-07-25 NOTE — Telephone Encounter (Signed)
Pt has app in office today

## 2020-07-25 NOTE — Assessment & Plan Note (Signed)
Commended her on smoking cessation, encouraged her to continue.

## 2020-07-25 NOTE — Addendum Note (Signed)
Addended by: Ellamae Sia on: 07/25/2020 01:04 PM   Modules accepted: Orders

## 2020-07-25 NOTE — Patient Instructions (Signed)
Stop by the lab prior to leaving today. I will notify you of your results once received.   Advance your diet slowly as tolerated.  Follow up with cardiology as scheduled.  Please notify me when your abdominal pain has resolved, we need to repeat your CT scan.  It was a pleasure to see you today!

## 2020-07-25 NOTE — Assessment & Plan Note (Signed)
Recent hospital admission for acute pancreatitis, although unclear cause for event.  Commended her on tobacco and alcohol cessation, although she hardly drink alcohol.  Exam today with tenderness noted, but appears stable.  Appetite good.  Repeat labs today including lipase, CBC, BMP, magnesium.  We will repeat abdominal CT scan once symptoms have completely resolved.  She will notify once this occurs.  Advance diet as tolerated. Follow-up with cardiology as scheduled.  Hospital notes, labs, imaging reviewed.

## 2020-07-25 NOTE — Assessment & Plan Note (Signed)
B12 level during hospital stay normal. Checking methylmalonic acid labs today.

## 2020-07-25 NOTE — Addendum Note (Signed)
Addended by: [name] on: 07/25/2020 01:19 PM   Modules accepted: Orders

## 2020-07-25 NOTE — Progress Notes (Signed)
Subjective:    Patient ID: Amber Roberts, female    DOB: 1960/11/26, 59 y.o.   MRN: [id number]  HPI  This visit occurred during the SARS-CoV-2 public health emergency.  Safety protocols were in place, including screening questions prior to the visit, additional usage of staff PPE, and extensive cleaning of exam room while observing appropriate contact time as indicated for disinfecting solutions.   Amber Roberts is a 59 year old female with a history of CAD, aortic atherosclerosis, GERD, tobacco abuse, chronic neck and back pain who presents today for [facility] hospital follow up.  She presented to [facility] on 07/15/20 with reports of chest pain that began the morning prior. Several hours prior to arrival she developed nausea and vomiting.   During her stay in the ED she underwent labs including Troponin which were mildly elevated. ECG with "nonspecific changes", D-dimer was elevated to they proceed with CTA. CT showed acute pancreatitis. Lipase then performed which was 654. She admitted to some alcohol intake, but nothing excessive. She was admitted for further evaluation.  During her hospital stay she underwent IV fluids, IV antiemetics and pain medication. Cardiology consulted given her medical history and recent elevated troponin levels. She underwent echocardiogram which showed elevated filling pressures. Cardiology recommended adding metoprolol 12.5 mg BID.  During her hospital stay she began to have dyspnea and orthopnea. IV fluids were discontinued and IV Lasix was provided once.   She was discharged home on 07/20/20 with recommendations for repeat CBC, BMP, magnesium. Also with recommendations for B12 and methylmalonic acid levels due to macrocytic cells noted on CBC. Repeat imaging of the abdomen was recommended to exclude abdominal mass given lack of clear etiology for pancreatitis. She was initiated on potassium chloride 10 mEq daily for 14 days.   Since her discharge home she's quit  smoking and drinking alcohol. She is compliant to her metoprolol tartrate 12.5 mg BID. She has an appointment scheduled with cardiology for late October 2021. She has noticed bilateral lower extremity edema, mostly to right ankle and lower extremity over the last several days.  She has been gradually increasing her hours at work, notices the swelling mostly after coming home from work.  Her appetite has returned and she's eating fresh vegetables and fruits. She's avoiding food to aggravate her abdomen. She continues to notice mid abdominal pain/pressure with bloating.  She is having daily bowel movements.  BP Readings from Last 3 Encounters:  07/25/20 138/82  07/20/20 (!) 151/90  06/28/20 140/88     Review of Systems  Constitutional: Negative for fever.  Respiratory: Negative for shortness of breath.   Cardiovascular: Positive for leg swelling. Negative for chest pain.  Gastrointestinal: Positive for abdominal pain. Negative for constipation and diarrhea.       Past Medical History:  Diagnosis Date  . Abnormal Pap smear of vagina    abnl paps of vaginal cuff; last pap 11/2003  . Cervical cancer ([place]) early 20's  . Chest pain   . Fibromyalgia   . GERD (gastroesophageal reflux disease)   . Hyperlipidemia   . MI (myocardial infarction) ([place])   . Osteopenia   . Premature menopause 37-38  . Seasonal allergies    now mostly yearlong  . Seizure ([place]) 2010   per pt, related to lowered seizure threshold from imipramine and tramadol)--saw Dr. Jannifer Franklin  . Smoker      Social History   Socioeconomic History  . Marital status: Single    Spouse name: Not on file  .  Number of children: 1  . Years of education: Not on file  . Highest education level: Not on file  Occupational History  . 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 Smoking status: Current Every Day Smoker    Packs/day: 1.00    Years: 41.00    Pack years: 41.00    Types: Cigarettes  . Smokeless  tobacco: Never Used  Vaping Use  . Vaping Use: Never used  Substance and Sexual Activity  . Alcohol use: Yes    Alcohol/week: 14.0 standard drinks    Types: 14 Cans of beer per week    Comment: 2-3 beers per day.  . Drug use: No  . Sexual activity: Not Currently  Other Topics Concern  . Not on file  Social History Narrative   Lives at home with 47 year old autistic son.  5 cats, 1 dog.     Social Determinants of Health   Financial Resource Strain:   . Difficulty of Paying Living Expenses: Not on file  Food Insecurity:   . Worried About Charity fundraiser in the Last Year: Not on file  . Ran Out of Food in the Last Year: Not on file  Transportation Needs:   . Lack of Transportation (Medical): Not on file  . Lack of Transportation (Non-Medical): Not on file  Physical Activity:   . Days of Exercise per Week: Not on file  . Minutes of Exercise per Session: Not on file  Stress:   . Feeling of Stress : Not on file  Social Connections:   . Frequency of Communication with Friends and Family: Not on file  . Frequency of Social Gatherings with Friends and Family: Not on file  . Attends Religious Services: Not on file  . Active Member of Clubs or Organizations: Not on file  . Attends Archivist Meetings: Not on file  . Marital Status: Not on file  Intimate Partner Violence:   . Fear of Current or Ex-Partner: Not on file  . Emotionally Abused: Not on file  . Physically Abused: Not on file  . Sexually Abused: Not on file    Past Surgical History:  Procedure Laterality Date  . surgery for cervical cancer    . VAGINAL HYSTERECTOMY     fibroids  . WRIST SURGERY     deQuervains    Family History  Problem Relation Age of Onset  . Scoliosis Mother   . Cancer Father 92       testicular cancer  . Autism Son   . Cancer Maternal Uncle        esophageal (smoker)  . Macular degeneration Maternal Grandmother        wet  . Hypertension Maternal Grandmother   .  Hyperlipidemia Maternal Grandmother   . Depression Maternal Grandmother        related to age, loss of vision  . Colitis Maternal Grandmother   . Breast cancer Maternal Grandmother   . Breast cancer Paternal Grandfather   . Diabetes Neg Hx     Allergies  Allergen Reactions  . Darvon [Propoxyphene Hcl] Nausea And Vomiting    Current Outpatient Medications on File Prior to Visit  Medication Sig Dispense Refill  . ibuprofen (ADVIL) 600 MG tablet Take 1 tablet (600 mg total) by mouth every 6 (six) hours as needed (musculoskeletal pain). 30 tablet 0  . lansoprazole (PREVACID) 15 MG capsule Take 15 mg by mouth daily.     Marland Kitchen  metoprolol tartrate (LOPRESSOR) 25 MG tablet Take 0.5 tablets (12.5 mg total) by mouth 2 (two) times daily. 60 tablet 2  . ondansetron (ZOFRAN) 4 MG tablet Take 1 tablet (4 mg total) by mouth every 6 (six) hours as needed for nausea. 20 tablet 0  . potassium chloride (KLOR-CON) 10 MEQ tablet Take 1 tablet (10 mEq total) by mouth daily for 14 days. 14 tablet 0  . Vitamin D, Ergocalciferol, (DRISDOL) 1.25 MG (50000 UNIT) CAPS capsule Take 1 capsule by mouth once weekly. 12 capsule 1   No current facility-administered medications on file prior to visit.    BP 138/82   Pulse 81   Temp (!) 96.2 F (35.7 C) (Temporal)   Ht 5' 6.5" (1.689 m)   Wt 108 lb (49 kg)   SpO2 99%   BMI 17.17 kg/m    Objective:   Physical Exam Constitutional:      General: She is not in acute distress.    Appearance: She is not ill-appearing.  Cardiovascular:     Rate and Rhythm: Normal rate and regular rhythm.  Pulmonary:     Effort: Pulmonary effort is normal.     Breath sounds: Normal breath sounds.  Abdominal:     General: Abdomen is flat. There is no distension.     Palpations: Abdomen is soft.     Tenderness: There is abdominal tenderness in the epigastric area, periumbilical area and left upper quadrant. There is no guarding.    Neurological:     Mental Status: She is alert.              Assessment & Plan:

## 2020-07-28 DIAGNOSIS — K859 Acute pancreatitis without necrosis or infection, unspecified: Secondary | ICD-10-CM

## 2020-07-28 DIAGNOSIS — D539 Nutritional anemia, unspecified: Secondary | ICD-10-CM

## 2020-07-28 LAB — METHYLMALONIC ACID, SERUM: Methylmalonic Acid, Quant: 353 nmol/L — ABNORMAL HIGH (ref 87–318)

## 2020-08-05 ENCOUNTER — Telehealth: Payer: Self-pay

## 2020-08-05 NOTE — Telephone Encounter (Signed)
I spoke with pt; pt said she will call cardiologist and see if can be seen if not will go to UC; pt does not want to go to ED for fear of being admitted to hospital. UC & ED precautions given and pt voiced understanding. Gentry Fitz NP is out of office; sending note to Avie Echevaria NP. Pt said she will cb later this week with update.

## 2020-08-05 NOTE — Telephone Encounter (Signed)
[place] Night - Client TELEPHONE ADVICE RECORD [facility] Patient Name: Amber Roberts N Gender: Female DOB: [birth year] Age: 59 Y 32 M 22 D Return Phone Number: [phone] (Primary) Address: City/State/Zip: [place] [address] Client [facility] Night - Client Client Site [place] Physician Alma Friendly - NP Contact Type Call Who Is Calling Patient / Member / Family / Caregiver Call Type Triage / Clinical Relationship To Patient Self Return Phone Number [phone] (Primary) Chief Complaint Chest Pain (non urgent symptoms) Reason for Call Symptomatic / Request for [name] states she is experiencing pain under the right breast. She has heart issues and she is very concerned. The suddenly appeared and will not go away. Translation No Nurse Assessment Nurse: Zenia Resides, RN, Diane Date/Time (Eastern Time): 08/04/2020 8:33:30 AM Confirm and document reason for call. If symptomatic, describe symptoms. ---Caller states she has pain under her left breast. Pt. was in the hospital for heart issues and pancreatitis. Will have a cardiology consult. Starting on Friday had a sharp pain under her left breastbecame severe Saturday morning. Eased off some and then became sharp again. Hx of MI and poss. PE. Pain is a 4 right now. Hurts to breathe or talk or sneeze. Radiates to shoulder, neck and arm. SOB last night Does the patient have any new or worsening symptoms? ---Yes Will a triage be completed? ---Yes Related visit to physician within the last 2 weeks? ---Yes Does the PT have any chronic conditions? (i.e. diabetes, asthma, this includes High risk factors for pregnancy, etc.) ---Yes List chronic conditions. ---MI, pancreatitis, blood issues Is this a behavioral health or substance abuse call? ---No Guidelines Guideline Title Affirmed Question Affirmed Notes  Nurse Date/Time (Eastern Time) Chest Pain Pain also in shoulder(s) or arm(s) or jaw (Exception: pain is clearly made worse by movement) Zenia Resides, RN, Diane 08/04/2020 8:38:03 AM Disp. Time Eilene Ghazi Time) Disposition Final User 08/04/2020 8:40:13 AM Go to ED Now Yes Zenia Resides, RN, Diane PLEASE NOTE: All timestamps contained within this report are represented as [place] Standard Time. CONFIDENTIALTY NOTICE: This fax transmission is intended only for the addressee. It contains information that is legally privileged, confidential or otherwise protected from use or disclosure. If you are not the intended recipient, you are strictly prohibited from reviewing, disclosing, copying using or disseminating any of this information or taking any action in reliance on or regarding this information. If you have received this fax in error, please notify us immediately by telephone so that we can arrange for its return to [place]. Phone: [phone], Toll-Free: [phone], Fax: [phone] Page: 2 of 2 Call Id: [id number] [name] Disagree/Comply Comply Caller Understands Yes PreDisposition Go to ED Care Advice Given Per Guideline GO TO ED NOW: * You need to be seen in the Emergency Department. CARE ADVICE given per Chest Pain (Adult) guideline. Referrals [facility] - ED

## 2020-08-05 NOTE — Telephone Encounter (Signed)
noted 

## 2020-08-16 ENCOUNTER — Other Ambulatory Visit (INDEPENDENT_AMBULATORY_CARE_PROVIDER_SITE_OTHER): Payer: 59

## 2020-08-16 ENCOUNTER — Other Ambulatory Visit: Payer: Self-pay

## 2020-08-16 DIAGNOSIS — K859 Acute pancreatitis without necrosis or infection, unspecified: Secondary | ICD-10-CM | POA: Diagnosis not present

## 2020-08-16 DIAGNOSIS — D539 Nutritional anemia, unspecified: Secondary | ICD-10-CM

## 2020-08-16 LAB — IBC + FERRITIN
Ferritin: 175.4 ng/mL (ref 10.0–291.0)
Iron: 98 ug/dL (ref 42–145)
Saturation Ratios: 22.1 % (ref 20.0–50.0)
Transferrin: 317 mg/dL (ref 212.0–360.0)

## 2020-08-16 LAB — VITAMIN B12: Vitamin B-12: 192 pg/mL — ABNORMAL LOW (ref 211–911)

## 2020-08-16 LAB — AMYLASE: Amylase: 29 U/L (ref 27–131)

## 2020-08-16 LAB — LIPASE: Lipase: 36 U/L (ref 11.0–59.0)

## 2020-08-19 LAB — CBC WITH DIFFERENTIAL/PLATELET
Absolute Monocytes: 1288 cells/uL — ABNORMAL HIGH (ref 200–950)
Basophils Absolute: 27 cells/uL (ref 0–200)
Basophils Relative: 0.2 %
Eosinophils Absolute: 14 cells/uL — ABNORMAL LOW (ref 15–500)
Eosinophils Relative: 0.1 %
HCT: 36.5 % (ref 35.0–45.0)
Hemoglobin: 12 g/dL (ref 11.7–15.5)
Lymphs Abs: 3535 cells/uL (ref 850–3900)
MCH: 32.9 pg (ref 27.0–33.0)
MCHC: 32.9 g/dL (ref 32.0–36.0)
MCV: 100 fL (ref 80.0–100.0)
MPV: 11.3 fL (ref 7.5–12.5)
Monocytes Relative: 9.4 %
Neutro Abs: 8837 cells/uL — ABNORMAL HIGH (ref 1500–7800)
Neutrophils Relative %: 64.5 %
Platelets: 250 10*3/uL (ref 140–400)
RBC: 3.65 10*6/uL — ABNORMAL LOW (ref 3.80–5.10)
RDW: 14.3 % (ref 11.0–15.0)
Total Lymphocyte: 25.8 %
WBC: 13.7 10*3/uL — ABNORMAL HIGH (ref 3.8–10.8)

## 2020-08-19 LAB — PATHOLOGIST SMEAR REVIEW

## 2020-08-20 DIAGNOSIS — Z8719 Personal history of other diseases of the digestive system: Secondary | ICD-10-CM

## 2020-08-20 DIAGNOSIS — D72829 Elevated white blood cell count, unspecified: Secondary | ICD-10-CM

## 2020-08-20 NOTE — Telephone Encounter (Signed)
Did you call her?

## 2020-08-20 NOTE — Telephone Encounter (Signed)
Spoke with patient regarding labs and prior CT which captured inflammation to the pancreatic tail. She is asymptomatic, denies fevers/chills/body aches/abdominal pain. We will repeat CBC with differential. We will also need to repeat CT to ensure that pancreatic inflammation was secondary to pancreatitis rather than another cause.

## 2020-08-21 NOTE — Telephone Encounter (Signed)
[place] Night - Client Nonclinical Telephone Record  [facility] Client [facility] Night - Client Client Site [place] Physician Alma Friendly - NP Contact Type Call Who Is Calling Patient / Member / Family / Caregiver Caller Name [place] Phone Number [phone] Call Type Message Only Information Provided Reason for Call Returning a Call from the Office Initial [place] is returning a call from the office. Disp. Time Disposition Final User 08/20/2020 5:03:06 PM General Information Provided Yes Tyler Deis Call Closed By: Tyler Deis Transaction Date/Time: 08/20/2020 5:01:32 PM (ET)   Provider has already contacted pt

## 2020-08-22 ENCOUNTER — Other Ambulatory Visit: Payer: Self-pay | Admitting: Primary Care

## 2020-08-22 DIAGNOSIS — D72829 Elevated white blood cell count, unspecified: Secondary | ICD-10-CM

## 2020-08-22 DIAGNOSIS — Z8719 Personal history of other diseases of the digestive system: Secondary | ICD-10-CM

## 2020-08-23 ENCOUNTER — Other Ambulatory Visit: Payer: Self-pay

## 2020-08-23 ENCOUNTER — Other Ambulatory Visit (INDEPENDENT_AMBULATORY_CARE_PROVIDER_SITE_OTHER): Payer: 59

## 2020-08-23 ENCOUNTER — Other Ambulatory Visit: Payer: Self-pay | Admitting: Primary Care

## 2020-08-23 DIAGNOSIS — D72829 Elevated white blood cell count, unspecified: Secondary | ICD-10-CM

## 2020-08-23 DIAGNOSIS — Z8719 Personal history of other diseases of the digestive system: Secondary | ICD-10-CM

## 2020-08-23 LAB — BASIC METABOLIC PANEL
BUN: 9 mg/dL (ref 6–23)
CO2: 24 mEq/L (ref 19–32)
Calcium: 8.9 mg/dL (ref 8.4–10.5)
Chloride: 99 mEq/L (ref 96–112)
Creatinine, Ser: 0.67 mg/dL (ref 0.40–1.20)
GFR: 96.84 mL/min (ref >60.00)
Glucose, Bld: 104 mg/dL — ABNORMAL HIGH (ref 70–99)
Potassium: 3.7 mEq/L (ref 3.5–5.1)
Sodium: 132 mEq/L — ABNORMAL LOW (ref 135–145)

## 2020-08-23 LAB — CBC WITH DIFFERENTIAL/PLATELET
Basophils Absolute: 0.1 10*3/uL (ref 0.0–0.1)
Basophils Relative: 0.6 % (ref 0.0–3.0)
Eosinophils Absolute: 0.2 10*3/uL (ref 0.0–0.7)
Eosinophils Relative: 1.9 % (ref 0.0–5.0)
HCT: 37.5 % (ref 36.0–46.0)
Hemoglobin: 12.5 g/dL (ref 12.0–15.0)
Lymphocytes Relative: 36.4 % (ref 12.0–46.0)
Lymphs Abs: 3.1 10*3/uL (ref 0.7–4.0)
MCHC: 33.3 g/dL (ref 30.0–36.0)
MCV: 103 fl — ABNORMAL HIGH (ref 78.0–100.0)
Monocytes Absolute: 0.9 10*3/uL (ref 0.1–1.0)
Monocytes Relative: 10.2 % (ref 3.0–12.0)
Neutro Abs: 4.4 10*3/uL (ref 1.4–7.7)
Neutrophils Relative %: 50.9 % (ref 43.0–77.0)
Platelets: 219 10*3/uL (ref 150.0–400.0)
RBC: 3.64 Mil/uL — ABNORMAL LOW (ref 3.87–5.11)
RDW: 17.4 % — ABNORMAL HIGH (ref 11.5–15.5)
WBC: 8.6 10*3/uL (ref 4.0–10.5)

## 2020-08-29 DIAGNOSIS — R000 Tachycardia, unspecified: Secondary | ICD-10-CM | POA: Insufficient documentation

## 2020-08-29 DIAGNOSIS — E785 Hyperlipidemia, unspecified: Secondary | ICD-10-CM | POA: Insufficient documentation

## 2020-08-29 DIAGNOSIS — I10 Essential (primary) hypertension: Secondary | ICD-10-CM | POA: Insufficient documentation

## 2020-08-29 NOTE — Progress Notes (Signed)
Cardiology Office Note   Date:  08/30/2020   ID:  Amber Roberts, DOB [birth year], MRN [id number]  PCP:  [name], NP  Cardiologist:   Minus Breeding, MD  Referring:  [name], NP  Chief Complaint  Patient presents with  . Palpitations      History of Present Illness: Amber Roberts is a 59 y.o. female who presents for evaluation of reported CAD and tachycardia.  She was referred by [name], NP   She had previously seen Dr. Mare Ferrari.  There is a mention of CAD but she did not have a cath.  She said [place] had an abnormal EKG in the hospital for four days thinking that she had had an MI but they never did a cath.  When she saw Dr. Mare Ferrari she apparently had a negative stress test although I don't have these records.  This was about 2009.  When I saw her at my first visit I ordered a POET (Plain Old Exercise Treadmill) which was negative.  She was having some palpitations as well.    She was in the hospital recently with pancreatitis.  She did have chest pain related to this and this was thought to be atypical.  She was seen in consultation by our service.  Her troponin was nonspecifically very mildly elevated with a flat trend that did not suggest acute coronary syndrome and she had no acute EKG changes.  Echo was unremarkable and no further work-up was suggested at that time.  She did have some tachycardia.  She was started on a low-dose beta-blocker.  She says this really makes her feel better controls her blood pressure and her heart rate is not as high.  She has some fleeting chest discomfort occasionally.  She has some fleeting sharp calf discomfort as well.  However, she is not describing classic substernal chest discomfort, neck or arm discomfort.  She is not having any presyncope or syncope.  I was able to review the CT that was done to rule out pulmonary embolism and there was no coronary calcium.  I  went back to 2019 CT and there was mention of some aortic atherosclerosis which was not mentioned on his most recent CT.  Past Medical History:  Diagnosis Date  . Abnormal Pap smear of vagina    abnl paps of vaginal cuff; last pap 11/2003  . Cervical cancer ([place]) early 20's  . Chest pain   . Fibromyalgia   . GERD (gastroesophageal reflux disease)   . Hyperlipidemia   . MI (myocardial infarction) ([place])   . Osteopenia   . Premature menopause 37-38  . Seasonal allergies    now mostly yearlong  . Seizure ([place]) 2010   per pt, related to lowered seizure threshold from imipramine and tramadol)--saw Dr. Jannifer Franklin  . Smoker     Past Surgical History:  Procedure Laterality Date  . surgery for cervical cancer    . VAGINAL HYSTERECTOMY     fibroids  . WRIST SURGERY     deQuervains     Current Outpatient Medications  Medication Sig Dispense Refill  . ibuprofen (ADVIL) 600 MG tablet Take 1 tablet (600 mg total) by mouth every 6 (six) hours as needed (musculoskeletal pain). 30 tablet 0  . lansoprazole (PREVACID) 15 MG capsule Take 15 mg by mouth daily.     . metoprolol tartrate (LOPRESSOR) 25 MG tablet Take 0.5 tablets (12.5 mg total)  by mouth 2 (two) times daily. 60 tablet 2  . Vitamin D, Ergocalciferol, (DRISDOL) 1.25 MG (50000 UNIT) CAPS capsule Take 1 capsule by mouth once weekly. 12 capsule 1  . ondansetron (ZOFRAN) 4 MG tablet Take 1 tablet (4 mg total) by mouth every 6 (six) hours as needed for nausea. (Patient not taking: Reported on 08/30/2020) 20 tablet 0   No current facility-administered medications for this visit.    Allergies:   Darvon [propoxyphene hcl]     ROS:  Please see the history of present illness.   Otherwise, review of systems are positive for none.   All other systems are reviewed and negative.    PHYSICAL EXAM: VS:  BP 129/67   Pulse 77   Ht 5' 5.5" (1.664 m)   Wt 110 lb 3.2 oz (50 kg)   SpO2 98%   BMI 18.06 kg/m  , BMI Body mass index is 18.06 kg/m.    GENERAL:  Well appearing NECK:  No jugular venous distention, waveform within normal limits, carotid upstroke brisk and symmetric, no bruits, no thyromegaly LUNGS:  Clear to auscultation bilaterally CHEST:  Unremarkable HEART:  PMI not displaced or sustained,S1 and S2 within normal limits, no S3, no S4, no clicks, no rubs, no murmurs ABD:  Flat, positive bowel sounds normal in frequency in pitch, no bruits, no rebound, no guarding, no midline pulsatile mass, no hepatomegaly, no splenomegaly EXT:  2 plus pulses throughout, no edema, no cyanosis no clubbing   EKG:  EKG is ordered today. Sinus rhythm, rate 78, incomplete right bundle branch block, low voltage, possible left atrial enlargement, poor anterior R wave progression, no change from previous.   Recent Labs: 05/31/2020: TSH 1.17 07/18/2020: ALT 15 07/25/2020: Magnesium 1.7 08/23/2020: BUN 9; Creatinine, Ser 0.67; Hemoglobin 12.5; Platelets 219.0; Potassium 3.7; Sodium 132    Lipid Panel    Component Value Date/Time   CHOL 154 07/17/2020 0435   TRIG 84 07/17/2020 0435   HDL 37 (L) 07/17/2020 0435   CHOLHDL 4.2 07/17/2020 0435   VLDL 17 07/17/2020 0435   LDLCALC 100 (H) 07/17/2020 0435   LDLDIRECT 114.0 08/26/2018 1105      Wt Readings from Last 3 Encounters:  08/30/20 110 lb 3.2 oz (50 kg)  07/25/20 108 lb (49 kg)  07/15/20 104 lb 8 oz (47.4 kg)      Other studies Reviewed: Additional studies/ records that were reviewed today include: Hospital records and previous CTs Review of the above records demonstrates:   As above   ASSESSMENT AND PLAN:   ABNORMAL EKG: The patient had a negative treadmill test 3 years ago.  She has had no change in her echocardiogram.  Her EKG is unchanged.  She has no diagnostic enzyme trends during her acute illness.  She has no acute ongoing symptoms consistent with active coronary artery disease.  There is no evidence of coronary calcium on CT.  I do not think further work-up is indicated.    HTN:  The blood pressure is well controlled.  She can continue the low-dose beta-blockers.   DYSLIPIDEMIA: Her last triglyceride was 84.  LDL was 100 with an HDL of 47.  No change in therapy.  TACHYCARDIA:    She will remain on the low-dose beta-blocker.  HYPONATREMIA: She can reduce her free water intake.  This is followed by her primary provider.  The following changes have been made: None  Labs/ tests ordered today include: None  Orders Placed This Encounter  Procedures  .  EKG 12-Lead     Disposition:   FU with me as needed   Signed, Minus Breeding, MD  08/30/2020 9:53 AM    [place]

## 2020-08-30 ENCOUNTER — Ambulatory Visit (INDEPENDENT_AMBULATORY_CARE_PROVIDER_SITE_OTHER): Payer: 59 | Admitting: Cardiology

## 2020-08-30 ENCOUNTER — Other Ambulatory Visit: Payer: Self-pay

## 2020-08-30 ENCOUNTER — Encounter: Payer: Self-pay | Admitting: Cardiology

## 2020-08-30 VITALS — BP 129/67 | HR 77 | Ht 65.5 in | Wt 110.2 lb

## 2020-08-30 DIAGNOSIS — R9431 Abnormal electrocardiogram [ECG] [EKG]: Secondary | ICD-10-CM | POA: Diagnosis not present

## 2020-08-30 DIAGNOSIS — I10 Essential (primary) hypertension: Secondary | ICD-10-CM | POA: Diagnosis not present

## 2020-08-30 DIAGNOSIS — R000 Tachycardia, unspecified: Secondary | ICD-10-CM | POA: Diagnosis not present

## 2020-08-30 DIAGNOSIS — Z720 Tobacco use: Secondary | ICD-10-CM

## 2020-08-30 DIAGNOSIS — E785 Hyperlipidemia, unspecified: Secondary | ICD-10-CM | POA: Diagnosis not present

## 2020-08-30 NOTE — Patient Instructions (Signed)
Medication Instructions:  No changes *If you need a refill on your cardiac medications before your next appointment, please call your pharmacy*   Lab Work: None ordered If you have labs (blood work) drawn today and your tests are completely normal, you will receive your results only by: Marland Kitchen MyChart Message (if you have MyChart) OR . A paper copy in the mail If you have any lab test that is abnormal or we need to change your treatment, we will call you to review the results.   Testing/Procedures: None ordered   Follow-Up: At [facility], you and your health needs are our priority.  As part of our continuing mission to provide you with exceptional heart care, we have created designated Provider Care Teams.  These Care Teams include your primary Cardiologist (physician) and Advanced Practice Providers (APPs -  Physician Assistants and Nurse Practitioners) who all work together to provide you with the care you need, when you need it.  We recommend signing up for the patient portal called "MyChart".  Sign up information is provided on this After Visit Summary.  MyChart is used to connect with patients for Virtual Visits (Telemedicine).  Patients are able to view lab/test results, encounter notes, upcoming appointments, etc.  Non-urgent messages can be sent to your provider as well.   To learn more about what you can do with MyChart, go to [email or web address].    Your next appointment:   Follow up with Dr. Minus Breeding, MD on an as-needed basis  Provider:   Minus Breeding, MD   Other Instructions None

## 2020-09-02 ENCOUNTER — Ambulatory Visit: Payer: 59

## 2020-09-03 ENCOUNTER — Other Ambulatory Visit: Payer: Self-pay | Admitting: Primary Care

## 2020-09-04 ENCOUNTER — Ambulatory Visit
Admission: RE | Admit: 2020-09-04 | Discharge: 2020-09-04 | Disposition: A | Discharge status: Home / Self Care | Payer: 59 | Source: Ambulatory Visit | Attending: Primary Care | Admitting: Primary Care

## 2020-09-04 DIAGNOSIS — D72829 Elevated white blood cell count, unspecified: Secondary | ICD-10-CM

## 2020-09-04 DIAGNOSIS — Z8719 Personal history of other diseases of the digestive system: Secondary | ICD-10-CM

## 2020-09-04 MED ORDER — IOPAMIDOL (ISOVUE-300) INJECTION 61%
100.0000 mL | Freq: Once | INTRAVENOUS | Status: AC | PRN
  Administered 2020-09-04: 100 mL via INTRAVENOUS

## 2020-09-05 DIAGNOSIS — K859 Acute pancreatitis without necrosis or infection, unspecified: Secondary | ICD-10-CM

## 2020-09-25 ENCOUNTER — Ambulatory Visit (INDEPENDENT_AMBULATORY_CARE_PROVIDER_SITE_OTHER): Payer: 59 | Admitting: Nurse Practitioner

## 2020-09-25 ENCOUNTER — Encounter: Payer: Self-pay | Admitting: Nurse Practitioner

## 2020-09-25 ENCOUNTER — Other Ambulatory Visit: Payer: 59

## 2020-09-25 VITALS — BP 132/84 | HR 108 | Ht 65.5 in | Wt 108.0 lb

## 2020-09-25 DIAGNOSIS — R197 Diarrhea, unspecified: Secondary | ICD-10-CM | POA: Diagnosis not present

## 2020-09-25 DIAGNOSIS — Z1211 Encounter for screening for malignant neoplasm of colon: Secondary | ICD-10-CM | POA: Diagnosis not present

## 2020-09-25 DIAGNOSIS — K859 Acute pancreatitis without necrosis or infection, unspecified: Secondary | ICD-10-CM | POA: Diagnosis not present

## 2020-09-25 DIAGNOSIS — K219 Gastro-esophageal reflux disease without esophagitis: Secondary | ICD-10-CM | POA: Diagnosis not present

## 2020-09-25 NOTE — Patient Instructions (Signed)
If you are age 57 or older, your body mass index should be between 23-30. Your Body mass index is 17.7 kg/m. If this is out of the aforementioned range listed, please consider follow up with your Primary Care Provider.  If you are age 12 or younger, your body mass index should be between 19-25. Your Body mass index is 17.7 kg/m. If this is out of the aformentioned range listed, please consider follow up with your Primary Care Provider.   Your provider has requested that you go to the basement level for lab work before leaving today. Press "B" on the elevator. The lab is located at the first door on the left as you exit the elevator.

## 2020-09-25 NOTE — Progress Notes (Signed)
ASSESSMENT AND PLAN    # Acute pancreatitis, resolving. Hospitalized mid September.  Mild hyperbilirubinemia but no evidence for cholelithiasis / choledocholithiasis. No hypertriglyceridemia or hypercalcemia. No suspicious medications. Etiology of pancreatitis unclear. Autoimmune disease is possible but favor Etoh related, especially given AST to ALT ratio.  Follow up CT scan on 11/3 notable for a small amount of peripancreatic fluid adjacent to the body extending to gastrc antrum where there is circumferential thickening which could be pancreatitis vrs gastritis. Patient feels better. Appetite is fine, no abdominal pain.  --Advised to avoid Etoh --Will ask Dr. Silverio Decamp to review CT scan to see if she wants an EGD to evaluate CT scan findings. If so then can probably get it done on same day as screening colonoscopy.  --May need follow up imaging of pancreas in a few weeks   # Bowel changes since hospital discharge in September. Having loose BMs 2-3 times a day. No history of chronic pancreatitis so doubt pancreatic exocrine insufficiency   No recent antibiotics but she was recently hospitalized.  --Check for c-diff.  --If negative then can evaluate further at time of screening colonoscopy.   # Macrocytosis probably combination of Vitamin B12 deficiency and Etoh.  Hgb declined during recent admission for pancreatitis but probably likely received aggressive fluid resuscitation for pancreatitis. Hgb returning to baseline.  --Vitamin B12 repletion per PCP  # GERD, chronic --Asymptomatic on daily PPI.  # Colon cancer screening.  --She has never had colon cancer screening. Patient didn't think she needed to in absence of symptoms or family history.  --If C-diff is negative will arrange for screening colonoscopy  HISTORY OF PRESENT ILLNESS     Primary Gastroenterologist : new - Harl Bowie, MD  Chief Complaint : pancreatitis  Amber Roberts is a 59 y.o. female with PMH / [place]  significant for,  but not necessarily limited to: GERD, cervical cancer, hysterectomy, gout, CAD, hyperlipidemai, prior tobacco use, fibromyalgia, chronic back pain, remote history of seizures  Patient is new to the practice, referred by PCP for pancreatitis. She presented to ED on 07/14/20 for evaluation of having chest pain and vomiting for 8 hours. Her D-dimer was elevated. Troponin was mildly elevated. CTA of chest was negative for PE but did show inflammatory type changes around pancreatic tail and near spleen. Lipase was 654, AST 75, ALT 25, bilirubin 2.2. [place] notable for mild ascites, no gallstones, biliary duct dilation, probabe fatty liver.  Admitted for 6 days with pancreatitis and atypical chest pain.  Prior to [place] she was taking only Vitamin D, prevacid and ibuprofen as needed ( maybe once a week). No Guadalupe Guerra of pancreatitis. Prior to admission patient was consuming 1-2 beers / day and approximately 3 mixed drinks a week.. Cardiology evaluated her inpatient for chest pain and tachycardia. Echocardiogram unremarkable. ACS not suspected. Started on beta blocker for tachycardia.   Patient saw PCP on 07/25/20 for hospital follow up. Follow up CT scan done 09/04/20 with findings of a small amount of peripancreatic fluid adjacent to the body extending to gastrc antrum where there is circumferential thickening. Since discharge she has stopped smoking and drinking Etoh.   Since hospital discharge patient experienced bowel complaints.  She has been having 2-3 loose bowel movements a day.  No blood in stool.  Patient says she has never had problems with bowel movements prior to this.   Patient saw Cardiologist for hospital follow up. No need for further cardiac workup    Data Reviewed:  08/16/20 Serum iron 98, ferritin 175, transferrin 317, 22% iron saturation Vitamin B12 1   08/23/20 Hgb 12.5, platelets 219, MCV 103 BMET normal   Previous Endoscopic Evaluations / Pertinent Studies:   07/16/20  RUQ [place] -Mild ascites. --Inhomogeneous echotexture in the liver, a finding likely indicative of focal fatty infiltration. No focal liver lesions evident.  07/15/20 Chest CTA No CT findings for pulmonary embolism. 2. Normal thoracic aorta. 3. No acute pulmonary findings. 4. Inflammatory type changes around the pancreatic tail and near the spleen. Recommend correlation with any symptoms of pancreatitis. Correlation with amylase level may be helpful.  09/04/20 CT scan w/ contrast --small amount of peripancreatic fluid adjacent to the body extending to gastrc antrum where there is circumferential thickening which could be pancreatitis vrs gastritis.     Past Medical History:  Diagnosis Date  . Abnormal Pap smear of vagina    abnl paps of vaginal cuff; last pap 11/2003  . Cervical cancer ([place]) early 20's  . Chest pain   . Fibromyalgia   . GERD (gastroesophageal reflux disease)   . Hyperlipidemia   . MI (myocardial infarction) ([place])   . Osteopenia   . Premature menopause 37-38  . Seasonal allergies    now mostly yearlong  . Seizure ([place]) 2010   per pt, related to lowered seizure threshold from imipramine and tramadol)--saw Dr. Jannifer Franklin  . Smoker      Past Surgical History:  Procedure Laterality Date  . surgery for cervical cancer    . VAGINAL HYSTERECTOMY     fibroids  . WRIST SURGERY     deQuervains   Family History  Problem Relation Age of Onset  . Scoliosis Mother   . Cancer Father 60       testicular cancer  . Autism Son   . Cancer Maternal Uncle        esophageal (smoker)  . Macular degeneration Maternal Grandmother        wet  . Hypertension Maternal Grandmother   . Hyperlipidemia Maternal Grandmother   . Depression Maternal Grandmother        related to age, loss of vision  . Colitis Maternal Grandmother   . Breast cancer Maternal Grandmother   . Breast cancer Paternal Grandfather   . Diabetes Neg Hx    Social History   Tobacco Use  . Smoking status:  Current Every Day Smoker    Packs/day: 1.00    Years: 41.00    Pack years: 41.00    Types: Cigarettes  . Smokeless tobacco: Never Used  Vaping Use  . Vaping Use: Never used  Substance Use Topics  . Alcohol use: Yes    Alcohol/week: 14.0 standard drinks    Types: 14 Cans of beer per week    Comment: 2-3 beers per day.  . Drug use: No   Current Outpatient Medications  Medication Sig Dispense Refill  . ibuprofen (ADVIL) 600 MG tablet Take 1 tablet (600 mg total) by mouth every 6 (six) hours as needed (musculoskeletal pain). 30 tablet 0  . lansoprazole (PREVACID) 15 MG capsule Take 15 mg by mouth daily.     . metoprolol tartrate (LOPRESSOR) 25 MG tablet Take 0.5 tablets (12.5 mg total) by mouth 2 (two) times daily. 60 tablet 2  . ondansetron (ZOFRAN) 4 MG tablet Take 1 tablet (4 mg total) by mouth every 6 (six) hours as needed for nausea. 20 tablet 0  . Vitamin D, Ergocalciferol, (DRISDOL) 1.25 MG (50000 UNIT) CAPS capsule Take 1  capsule by mouth once weekly. 12 capsule 1   No current facility-administered medications for this visit.   Allergies  Allergen Reactions  . Darvon [Propoxyphene Hcl] Nausea And Vomiting     Review of Systems: Positive for arthritis, back pain, fatigue, swelling of feet and legs.  All other systems reviewed and negative except where noted in HPI.   PHYSICAL EXAM :    Wt Readings from Last 3 Encounters:  09/25/20 108 lb (49 kg)  08/30/20 110 lb 3.2 oz (50 kg)  07/25/20 108 lb (49 kg)    BP 132/84   Pulse (!) 108   Ht 5' 5.5" (1.664 m)   Wt 108 lb (49 kg)   BMI 17.70 kg/m  Constitutional:  Pleasant female in no acute distress. Psychiatric: Normal mood and affect. Behavior is normal. EENT: Pupils normal.  Conjunctivae are normal. No scleral icterus. Neck supple.  Cardiovascular: Normal rate, regular rhythm. No edema Pulmonary/chest: Effort normal and breath sounds normal. No wheezing, rales or rhonchi. Abdominal: Soft, nondistended, nontender.  Bowel sounds active throughout. There are no masses palpable. No hepatomegaly. Neurological: Alert and oriented to person place and time. Skin: Skin is warm and dry. No rashes noted.  Tye Savoy, NP  09/25/2020, 11:02 AM  Cc:  Referring Provider [name], NP

## 2020-10-05 ENCOUNTER — Ambulatory Visit: Payer: 59 | Attending: Internal Medicine

## 2020-10-05 DIAGNOSIS — Z23 Encounter for immunization: Secondary | ICD-10-CM

## 2020-10-05 NOTE — Progress Notes (Signed)
   Covid-19 Vaccination Clinic  Name:  Amber Roberts    MRN: [id number] DOB: 1961/09/03  10/05/2020  [name] was observed post Covid-19 immunization for 15 minutes without incident. She was provided with Vaccine Information Sheet and instruction to access the V-Safe system.   [name] was instructed to call 911 with any severe reactions post vaccine: Marland Kitchen Difficulty breathing  . Swelling of face and throat  . A fast heartbeat  . A bad rash all over body  . Dizziness and weakness   Immunizations Administered    Name Date Dose VIS Date Route   Pfizer COVID-19 Vaccine 10/05/2020 12:24 PM 0.3 mL 08/21/2020 Intramuscular   Manufacturer: [place]   Lot: [id number]   NDC: [address]-5

## 2020-10-08 NOTE — Progress Notes (Signed)
Reviewed and agree with documentation and assessment and plan. K. Veena Prather Failla , MD   

## 2020-10-11 ENCOUNTER — Telehealth: Payer: Self-pay

## 2020-10-11 NOTE — Telephone Encounter (Signed)
Bring "her in for follow up with me to discuss EGD and colonoscopy as it appeared that she was reluctant to do colonoscopy."  Given findings of antral thickening on CT, its better to proceed with EGD along with colonoscopy if patient is willing  Received the staff message regarding contacting the patient.  Called the patient. No answer. Left her a voicemail asking she return my call about scheduling.

## 2020-10-15 NOTE — Telephone Encounter (Addendum)
Called the patient. No answer. Left a message to call back. Check for the C Diff stool study.

## 2020-10-21 ENCOUNTER — Other Ambulatory Visit: Payer: 59

## 2020-10-21 DIAGNOSIS — Z20822 Contact with and (suspected) exposure to covid-19: Secondary | ICD-10-CM

## 2020-10-23 LAB — NOVEL CORONAVIRUS, NAA: SARS-CoV-2, NAA: NOT DETECTED

## 2020-10-23 LAB — SARS-COV-2, NAA 2 DAY TAT

## 2020-11-06 ENCOUNTER — Other Ambulatory Visit: Payer: 59

## 2020-11-19 NOTE — Telephone Encounter (Signed)
Spoke with the patient. 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 Recently had +COVID but she is out of quarantine as of today. Her boyfriend has a cancer diagnosis and is about to have surgery. She says she will call us back and schedule her follow up appointment. plans to call us tomorrow.

## 2020-11-21 ENCOUNTER — Other Ambulatory Visit: Payer: Self-pay | Admitting: Primary Care

## 2020-11-21 DIAGNOSIS — Z1231 Encounter for screening mammogram for malignant neoplasm of breast: Secondary | ICD-10-CM

## 2020-11-25 NOTE — Telephone Encounter (Signed)
Ok, thanks.

## 2020-12-06 ENCOUNTER — Encounter: Payer: Self-pay | Admitting: Primary Care

## 2020-12-06 ENCOUNTER — Other Ambulatory Visit: Payer: Self-pay

## 2020-12-06 ENCOUNTER — Ambulatory Visit: Payer: 59 | Admitting: Primary Care

## 2020-12-06 VITALS — BP 138/84 | HR 93 | Temp 97.8°F | Ht 66.0 in | Wt 112.2 lb

## 2020-12-06 DIAGNOSIS — I10 Essential (primary) hypertension: Secondary | ICD-10-CM | POA: Diagnosis not present

## 2020-12-06 MED ORDER — LOSARTAN POTASSIUM 25 MG PO TABS: 25.0000 mg | ORAL_TABLET | Freq: Every day | ORAL | 0 refills | Status: DC

## 2020-12-06 NOTE — Progress Notes (Signed)
Subjective:    Patient ID: Amber Roberts, female    DOB: 1961/10/09, 60 y.o.   MRN: [id number]  HPI  This visit occurred during the SARS-CoV-2 public health emergency.  Safety protocols were in place, including screening questions prior to the visit, additional usage of staff PPE, and extensive cleaning of exam room while observing appropriate contact time as indicated for disinfecting solutions.   Amber Roberts is a 60 year old female with a history of CAD, hypertension, pancreatitis, tobacco abuse, hyperlipidemia, macrocytic anemia who presents today to discuss hypertension.  Currently managed on metoprolol tartrate 25 mg BID. She's been checking her BP over the last week which has been running 175/106, 177/102, 173/101, 166/97, 165/101, 140/91, 160/105, 127/70, 138/86, 147/84. Her BP this morning was 143/87.   She woke up at 4 am last week and felt and "impending doom" and anxiety with fatigue and headache, she checked her BP which was in the 170's/90's. She's actually kept a headache all week. She is under a lot of stress as her boyfriend has aggressive prostate cancer and will undergo prostatectomy soon. Also with a history of CAD, follows with cardiology annually.  BP Readings from Last 3 Encounters:  12/06/20 138/84  09/25/20 132/84  08/30/20 129/67     Review of Systems  Constitutional: Positive for fatigue.  Respiratory: Negative for shortness of breath.   Cardiovascular: Negative for chest pain.  Neurological: Positive for headaches.  Psychiatric/Behavioral:       Increased stress with boyfriends prostate cancer and upcoming treatment.       Past Medical History:  Diagnosis Date  . Abnormal Pap smear of vagina    abnl paps of vaginal cuff; last pap 11/2003  . Cervical cancer ([place]) early 20's  . Chest pain   . Fibromyalgia   . GERD (gastroesophageal reflux disease)   . Hyperlipidemia   . MI (myocardial infarction) ([place])   . Osteopenia   . Premature menopause  37-38  . Seasonal allergies    now mostly yearlong  . Seizure ([place]) 2010   per pt, related to lowered seizure threshold from imipramine and tramadol)--saw Dr. Jannifer Franklin  . Smoker      Social History   Socioeconomic History  . Marital status: Single    Spouse name: Not on file  . Number of children: 1  . Years of education: Not on file  . Highest education level: Not on file  Occupational History  . 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 Smoking status: Current Every Day Smoker    Packs/day: 1.00    Years: 41.00    Pack years: 41.00    Types: Cigarettes  . Smokeless tobacco: Never Used  Vaping Use  . Vaping Use: Never used  Substance and Sexual Activity  . Alcohol use: Yes    Alcohol/week: 14.0 standard drinks    Types: 14 Cans of beer per week    Comment: 2-3 beers per day.  . Drug use: No  . Sexual activity: Not Currently  Other Topics Concern  . Not on file  Social History Narrative   Lives at home with 76 year old autistic son.  5 cats, 1 dog.     Social Determinants of Health   Financial Resource Strain: Not on file  Food Insecurity: Not on file  Transportation Needs: Not on file  Physical Activity: Not on file  Stress: Not on file  Social Connections: Not on file  Intimate Partner Violence:  Not on file    Past Surgical History:  Procedure Laterality Date  . surgery for cervical cancer    . VAGINAL HYSTERECTOMY     fibroids  . WRIST SURGERY     deQuervains    Family History  Problem Relation Age of Onset  . Scoliosis Mother   . Cancer Father 54       testicular cancer  . Autism Son   . Cancer Maternal Uncle        esophageal (smoker)  . Macular degeneration Maternal Grandmother        wet  . Hypertension Maternal Grandmother   . Hyperlipidemia Maternal Grandmother   . Depression Maternal Grandmother        related to age, loss of vision  . Colitis Maternal Grandmother   . Breast cancer Maternal Grandmother   .  Breast cancer Paternal Grandfather   . Diabetes Neg Hx     Allergies  Allergen Reactions  . Darvon [Propoxyphene Hcl] Nausea And Vomiting    Current Outpatient Medications on File Prior to Visit  Medication Sig Dispense Refill  . ibuprofen (ADVIL) 600 MG tablet Take 1 tablet (600 mg total) by mouth every 6 (six) hours as needed (musculoskeletal pain). 30 tablet 0  . lansoprazole (PREVACID) 15 MG capsule Take 15 mg by mouth daily.     . metoprolol tartrate (LOPRESSOR) 25 MG tablet Take 0.5 tablets (12.5 mg total) by mouth 2 (two) times daily. 60 tablet 2  . Vitamin D, Ergocalciferol, (DRISDOL) 1.25 MG (50000 UNIT) CAPS capsule Take 1 capsule by mouth once weekly. 12 capsule 1  . ondansetron (ZOFRAN) 4 MG tablet Take 1 tablet (4 mg total) by mouth every 6 (six) hours as needed for nausea. 20 tablet 0   No current facility-administered medications on file prior to visit.    BP 138/84   Pulse 93   Temp 97.8 F (36.6 C) (Oral)   Ht 5\' 6"  (1.676 m)   Wt 112 lb 3.2 oz (50.9 kg)   SpO2 100%   BMI 18.11 kg/m    Objective:   Physical Exam Constitutional:      Appearance: She is well-nourished.  Cardiovascular:     Rate and Rhythm: Normal rate and regular rhythm.  Pulmonary:     Effort: Pulmonary effort is normal.     Breath sounds: Normal breath sounds.  Musculoskeletal:     Cervical back: Neck supple.  Skin:    General: Skin is warm and dry.  Psychiatric:        Mood and Affect: Mood and affect normal.            Assessment & Plan:

## 2020-12-06 NOTE — Assessment & Plan Note (Signed)
Home readings quite high, unclear if her cuff is accurate, but BP today is too high in the office, especially in the setting of CAD.   Would avoid HCTZ given her chronically low sodium levels. Start losartan 25 mg daily, can titrate up from here if needed.  We will see her back in 2 weeks for BP check and BMP. She will monitor at home.

## 2020-12-06 NOTE — Patient Instructions (Signed)
Start losartan 25 mg daily for blood pressure. Take once daily.  Start monitoring your blood pressure daily, around the same time of day, for the next 2 weeks.  Ensure that you have rested for 30 minutes prior to checking your blood pressure. Record your readings and bring them to your next visit.  Please schedule a follow up visit to meet back with me in 2 weeks for blood pressure check.   It was a pleasure to see you today!

## 2020-12-17 ENCOUNTER — Telehealth: Payer: Self-pay | Admitting: Cardiology

## 2020-12-17 ENCOUNTER — Other Ambulatory Visit: Payer: Self-pay

## 2020-12-17 ENCOUNTER — Ambulatory Visit: Payer: 59 | Admitting: Primary Care

## 2020-12-17 ENCOUNTER — Encounter: Payer: Self-pay | Admitting: Primary Care

## 2020-12-17 VITALS — BP 126/82 | HR 82 | Temp 97.6°F | Ht 66.0 in | Wt 113.0 lb

## 2020-12-17 DIAGNOSIS — I10 Essential (primary) hypertension: Secondary | ICD-10-CM | POA: Diagnosis not present

## 2020-12-17 DIAGNOSIS — E7800 Pure hypercholesterolemia, unspecified: Secondary | ICD-10-CM

## 2020-12-17 DIAGNOSIS — I700 Atherosclerosis of aorta: Secondary | ICD-10-CM | POA: Diagnosis not present

## 2020-12-17 MED ORDER — LOSARTAN POTASSIUM 25 MG PO TABS: ORAL_TABLET | ORAL | 1 refills | Status: DC

## 2020-12-17 NOTE — Telephone Encounter (Signed)
[place] with me.   [place] T. Audie Box, MD, [place]  [address] [place], [place] [address] [phone]  10:06 AM

## 2020-12-17 NOTE — Telephone Encounter (Signed)
Patient requesting to switch from Dr. Percival Spanish to Dr. Audie Box.

## 2020-12-17 NOTE — Assessment & Plan Note (Signed)
BP improved in the office today since addition of losartan 25 mg. Recommended she continue losartan 25 mg and metoprolol tartrate 25 mg BID.  She is very concerned about her elevated home readings as she never sees a reading that we saw today. We discussed to work on stress/anxiety reduction. I offered an injection of Toradol today to see if this helped with frontal headache, especially given history of migraines, she kindly declines.   Refills provided for losartan 25 mg, discussed that she can take an extra 25 mg of losartan if BP remains above goal after two checks at home one hour apart.  Referral placed to cardiology for evaluation.

## 2020-12-17 NOTE — Telephone Encounter (Signed)
Sure. Thanks 

## 2020-12-17 NOTE — Progress Notes (Signed)
Subjective:    Patient ID: Amber Roberts, female    DOB: [birth year], 60 y.o.   MRN: [id number]  HPI  This visit occurred during the SARS-CoV-2 public health emergency.  Safety protocols were in place, including screening questions prior to the visit, additional usage of staff PPE, and extensive cleaning of exam room while observing appropriate contact time as indicated for disinfecting solutions.   [name] is a 60 year old female with a history of hypertension, CAD, pancreatitis, palpitations, hyperlipidemia who presents today for follow up of hypertension.  She was last evaluated on 12/06/20 due to reports of hypertension with readings in the 140's-170's/80's-100's, also with headaches. Office BP was above goal so losartan 25 mg was initiated.   Today she endorses home readings are ranging 140's-160's-80's-100's. She continues to experience headaches, has held a headache for the last two weeks to the frontal lobes with photophobia and phonophobia. She has a history of migraines but has not had a migraine for 10+ years, these headaches feel different. She is compliant to her losartan 25 mg and metoprolol tartrate 25 mg BID.   She currently follows with cardiology, last visit was in October 2021, she didn't feel comfortable with her cardiologist during her last visit, would like to see someone else. She would like a follow up appointment given her various BP readings. She admits to being under a lot of stress, her fiance is to undergo prostate surgery later this week, work is very stressful.   BP Readings from Last 3 Encounters:  12/17/20 126/82  12/06/20 138/84  09/25/20 132/84     Review of Systems  Constitutional: Positive for fatigue.  Eyes: Negative for visual disturbance.  Respiratory: Negative for shortness of breath.   Cardiovascular: Negative for chest pain and palpitations.  Neurological: Positive for headaches.  Psychiatric/Behavioral: The patient is  nervous/anxious.        Past Medical History:  Diagnosis Date  . Abnormal Pap smear of vagina    abnl paps of vaginal cuff; last pap 11/2003  . Cervical cancer ([place]) early 20's  . Chest pain   . Fibromyalgia   . GERD (gastroesophageal reflux disease)   . Hyperlipidemia   . MI (myocardial infarction) ([place])   . Osteopenia   . Premature menopause 37-38  . Seasonal allergies    now mostly yearlong  . Seizure ([place]) 2010   per pt, related to lowered seizure threshold from imipramine and tramadol)--saw Dr. Jannifer Franklin  . Smoker      Social History   Socioeconomic History  . Marital status: Single    Spouse name: Not on file  . Number of children: 1  . Years of education: Not on file  . Highest education level: Not on file  Occupational History  . 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 Smoking status: Current Every Day Smoker    Packs/day: 1.00    Years: 41.00    Pack years: 41.00    Types: Cigarettes  . Smokeless tobacco: Never Used  Vaping Use  . Vaping Use: Never used  Substance and Sexual Activity  . Alcohol use: Yes    Alcohol/week: 14.0 standard drinks    Types: 14 Cans of beer per week    Comment: 2-3 beers per day.  . Drug use: No  . Sexual activity: Not Currently  Other Topics Concern  . Not on file  Social History Narrative   Lives at home with 68 year old autistic  son.  5 cats, 1 dog.     Social Determinants of Health   Financial Resource Strain: Not on file  Food Insecurity: Not on file  Transportation Needs: Not on file  Physical Activity: Not on file  Stress: Not on file  Social Connections: Not on file  Intimate Partner Violence: Not on file    Past Surgical History:  Procedure Laterality Date  . surgery for cervical cancer    . VAGINAL HYSTERECTOMY     fibroids  . WRIST SURGERY     deQuervains    Family History  Problem Relation Age of Onset  . Scoliosis Mother   . Cancer Father 25       testicular cancer  .  Autism Son   . Cancer Maternal Uncle        esophageal (smoker)  . Macular degeneration Maternal Grandmother        wet  . Hypertension Maternal Grandmother   . Hyperlipidemia Maternal Grandmother   . Depression Maternal Grandmother        related to age, loss of vision  . Colitis Maternal Grandmother   . Breast cancer Maternal Grandmother   . Breast cancer Paternal Grandfather   . Diabetes Neg Hx     Allergies  Allergen Reactions  . Darvon [Propoxyphene Hcl] Nausea And Vomiting    Current Outpatient Medications on File Prior to Visit  Medication Sig Dispense Refill  . ibuprofen (ADVIL) 600 MG tablet Take 1 tablet (600 mg total) by mouth every 6 (six) hours as needed (musculoskeletal pain). 30 tablet 0  . lansoprazole (PREVACID) 15 MG capsule Take 15 mg by mouth daily.     . metoprolol tartrate (LOPRESSOR) 25 MG tablet Take 0.5 tablets (12.5 mg total) by mouth 2 (two) times daily. 60 tablet 2  . Vitamin D, Ergocalciferol, (DRISDOL) 1.25 MG (50000 UNIT) CAPS capsule Take 1 capsule by mouth once weekly. 12 capsule 1   No current facility-administered medications on file prior to visit.    BP 126/82   Pulse 82   Temp 97.6 F (36.4 C) (Temporal)   Ht 5\' 6"  (1.676 m)   Wt 113 lb (51.3 kg)   SpO2 99%   BMI 18.24 kg/m    Objective:   Physical Exam Constitutional:      Appearance: She is well-nourished.  Cardiovascular:     Rate and Rhythm: Normal rate and regular rhythm.  Pulmonary:     Effort: Pulmonary effort is normal.     Breath sounds: Normal breath sounds.  Musculoskeletal:     Cervical back: Neck supple.  Skin:    General: Skin is warm and dry.  Psychiatric:        Mood and Affect: Mood and affect and mood normal.            Assessment & Plan:

## 2020-12-17 NOTE — Patient Instructions (Signed)
Continue losartan 25 mg once daily for blood pressure. Okay to take an extra dose if needed as discussed.  Continue metoprolol tartrate 25 mg twice daily.   You will be contacted regarding your referral to cardiology.  Please let us know if you have not been contacted within two weeks.   Keep me updated regarding your blood pressure readings.  It was a pleasure to see you today!

## 2020-12-28 ENCOUNTER — Other Ambulatory Visit: Payer: Self-pay | Admitting: Primary Care

## 2020-12-28 DIAGNOSIS — I10 Essential (primary) hypertension: Secondary | ICD-10-CM

## 2020-12-30 NOTE — Telephone Encounter (Signed)
Did you want to change this?

## 2020-12-31 NOTE — Telephone Encounter (Signed)
Please notify patient that her losartan medication is on national back order. We will change this to irbesartan 75 mg once daily. Have her monitor her blood pressure and update if anything changes.

## 2021-01-01 NOTE — Telephone Encounter (Signed)
LMTCB

## 2021-01-03 ENCOUNTER — Encounter: Payer: Self-pay | Admitting: Cardiovascular Disease

## 2021-01-06 ENCOUNTER — Other Ambulatory Visit: Payer: Self-pay

## 2021-01-06 DIAGNOSIS — I10 Essential (primary) hypertension: Secondary | ICD-10-CM

## 2021-01-06 MED ORDER — IRBESARTAN 75 MG PO TABS: 75.0000 mg | ORAL_TABLET | Freq: Every day | ORAL | 3 refills | Status: DC

## 2021-01-06 NOTE — Telephone Encounter (Signed)
Called patient let her know that we had the change she has one month left of the Losartan then she will make change over.

## 2021-01-10 ENCOUNTER — Other Ambulatory Visit: Payer: Self-pay

## 2021-01-10 ENCOUNTER — Ambulatory Visit
Admission: RE | Admit: 2021-01-10 | Discharge: 2021-01-10 | Disposition: A | Discharge status: Home / Self Care | Payer: 59 | Source: Ambulatory Visit | Attending: Primary Care | Admitting: Primary Care

## 2021-01-10 DIAGNOSIS — Z1231 Encounter for screening mammogram for malignant neoplasm of breast: Secondary | ICD-10-CM | POA: Diagnosis not present

## 2021-02-26 ENCOUNTER — Other Ambulatory Visit: Payer: Self-pay | Admitting: Primary Care

## 2021-02-26 DIAGNOSIS — E559 Vitamin D deficiency, unspecified: Secondary | ICD-10-CM

## 2021-03-18 ENCOUNTER — Telehealth: Payer: Self-pay

## 2021-03-18 NOTE — Telephone Encounter (Signed)
Pt is calling from car; earlier her P was 110. Pt request cb today at [phone] to know what she can do about her med today.

## 2021-03-18 NOTE — Telephone Encounter (Signed)
Pt left v/m that recently she has been having more palpitations and fast heart beat and pt wants to know if could increase the metoprolol tartrate 25 mg and pt said Gentry Fitz NP wanted pt to take 1/2 tab daily. On med list has metoprolol tartrate 25 mg taking 1/2 tab bid. The last time pt seen by Gentry Fitz NP had in 12/17/20 office note to continue metoprolol 25 mg twice a day. Left v/m requesting pt to cb to [facility] for more info. Sending note to Gentry Fitz NP and [facility] CMA.

## 2021-03-18 NOTE — Telephone Encounter (Signed)
Spoke with patient and informed her of Kate's recommendations. Patient verbalized understanding.

## 2021-03-18 NOTE — Telephone Encounter (Signed)
Would recommend metoprolol tartrate 12.5 mg twice daily as prescribed. It would also be reasonable for her to contact her cardiologist as well.

## 2021-06-28 ENCOUNTER — Other Ambulatory Visit: Payer: Self-pay | Admitting: Primary Care

## 2021-06-28 DIAGNOSIS — E559 Vitamin D deficiency, unspecified: Secondary | ICD-10-CM

## 2021-07-14 ENCOUNTER — Telehealth: Payer: Self-pay | Admitting: *Deleted

## 2021-07-14 ENCOUNTER — Other Ambulatory Visit: Payer: Self-pay

## 2021-07-14 ENCOUNTER — Ambulatory Visit (INDEPENDENT_AMBULATORY_CARE_PROVIDER_SITE_OTHER)
Admission: RE | Admit: 2021-07-14 | Discharge: 2021-07-14 | Disposition: A | Discharge status: Home / Self Care | Payer: 59 | Source: Ambulatory Visit | Attending: Nurse Practitioner | Admitting: Nurse Practitioner

## 2021-07-14 ENCOUNTER — Ambulatory Visit: Payer: 59 | Admitting: Nurse Practitioner

## 2021-07-14 VITALS — BP 132/76 | HR 83 | Temp 98.7°F | Resp 14 | Ht 66.0 in | Wt 110.4 lb

## 2021-07-14 DIAGNOSIS — M546 Pain in thoracic spine: Secondary | ICD-10-CM

## 2021-07-14 HISTORY — DX: Pain in thoracic spine: M54.6

## 2021-07-14 MED ORDER — LIDOCAINE 5 % EX PTCH: 1.0000 | MEDICATED_PATCH | CUTANEOUS | 0 refills | Status: DC

## 2021-07-14 MED ORDER — PREDNISONE 20 MG PO TABS: ORAL_TABLET | ORAL | 0 refills | Status: AC

## 2021-07-14 NOTE — Telephone Encounter (Signed)
Patient scheduled for an appointment today with Romilda Garret NP today 07/14/21 at 3:40 pm.

## 2021-07-14 NOTE — Telephone Encounter (Signed)
PLEASE NOTE: All timestamps contained within this report are represented as [place] Standard Time. CONFIDENTIALTY NOTICE: This fax transmission is intended only for the addressee. It contains information that is legally privileged, confidential or otherwise protected from use or disclosure. If you are not the intended recipient, you are strictly prohibited from reviewing, disclosing, copying using or disseminating any of this information or taking any action in reliance on or regarding this information. If you have received this fax in error, please notify us immediately by telephone so that we can arrange for its return to [place]. Phone: [phone], Toll-Free: [phone], Fax: [phone] Page: 1 of 2 Call Id: [id number] [place] RECORD [facility] Patient Name: Amber Roberts [facility] Gender: Female DOB: [birth year] Age: 60 Y 69 M 1 D Return Phone Number: [phone] (Primary) Address: City/ State/ Zip: [place]  [address] Client [place] Night - Client Client Site [place] Physician Alma Friendly - NP Contact Type Call Who Is Calling Patient / Member / Family / Caregiver Call Type Triage / Clinical Relationship To Patient Self Return Phone Number [phone] (Primary) Chief Complaint Headache Reason for Call Symptomatic / Request for [place] states she has ongoing back pain. The pain is getting worse. Causing her headaches. Translation No Nurse Assessment Nurse: Clovis Riley, RN, Georgina Peer Date/Time (Eastern Time): 07/14/2021 8:32:30 AM Confirm and document reason for call. If symptomatic, describe symptoms. ---Caller states she has ongoing back pain that started on tuesday but is getting worse. States the pain is in her upper back. Causing her headaches and left arm numbness. No headache at present but had the worst headache last  night. Back pain with tingling. Does the patient have any new or worsening symptoms? ---Yes Will a triage be completed? ---Yes Related visit to physician within the last 2 weeks? ---No Does the PT have any chronic conditions? (i.e. diabetes, asthma, this includes High risk factors for pregnancy, etc.) ---Yes List chronic conditions. ---chronic back pain, heart Is this a behavioral health or substance abuse call? ---No Guidelines Guideline Title Affirmed Question Affirmed Notes Nurse Date/Time (Eastern Time) Back Pain [1] Numbness in an arm or hand (i.e., loss of sensation) AND [2] upper back pain Clovis Riley, RNGeorgina Peer 07/14/2021 8:35:49 AM Disp. Time Eilene Ghazi Time) Disposition Final User 07/14/2021 8:19:15 AM Attempt made - message left [place], RN, Georgina Peer PLEASE NOTE: All timestamps contained within this report are represented as [place] Standard Time. CONFIDENTIALTY NOTICE: This fax transmission is intended only for the addressee. It contains information that is legally privileged, confidential or otherwise protected from use or disclosure. If you are not the intended recipient, you are strictly prohibited from reviewing, disclosing, copying using or disseminating any of this information or taking any action in reliance on or regarding this information. If you have received this fax in error, please notify us immediately by telephone so that we can arrange for its return to [place]. Phone: [phone], Toll-Free: [phone], Fax: [phone] Page: 2 of 2 Call Id: [id number] 07/14/2021 8:39:06 AM See PCP within 24 Hours Yes Clovis Riley, RN, Leilani Merl Disagree/Comply Comply Caller Understands Yes PreDisposition Call Doctor Care Advice Given Per Guideline SEE PCP WITHIN 24 HOURS: * IF OFFICE WILL BE OPEN: You need to be examined within the next 24 hours. Call your doctor (or NP/PA) when the office opens and make an appointment. PAIN MEDICINES: * ACETAMINOPHEN - REGULAR STRENGTH TYLENOL: Take  650 mg (two 325  mg pills) by mouth every 4 to 6 hours as needed. Each Regular Strength Tylenol pill has 325 mg of acetaminophen. The most you should take each day is 3,250 mg (10 pills a day). CARE ADVICE given per Back Pain (Adult) guideline. CALL BACK IF: * You become worse Referrals REFERRED TO PCP OFFICE

## 2021-07-14 NOTE — Progress Notes (Signed)
Acute Office Visit  Subjective:    Patient ID: Amber Roberts, female    DOB: [birth year], 60 y.o.   MRN: [id number]  Chief Complaint  Patient presents with  . Back Pain    Started on 07/08/21, mid back area. After lifting a big heavy cooler in the vehicle. "Feels like she has been in a car accident due to the all the areas of pain now." Pain is now in neck, arms, ribs, center of her chest.     HPI Patient is in today for back pain   Started on 07/08/2021. States that it is along her bra strap. After lifting a heavy cooler. She did not feel a pop or pull. States pain started approx 30 minutes after. The pain is described as sharp stabbing and electric. 4-5 times a minute.  Weakness in the left hand that is not new.  Patient has tried salonpas and another topical treatment with out great relief. Has taken tylenol with no relief.  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 Abnormal Pap smear of vagina    abnl paps of vaginal cuff; last pap 11/2003  . Cervical cancer ([place]) early 20's  . Chest pain   . Fibromyalgia   . GERD (gastroesophageal reflux disease)   . Hyperlipidemia   . MI (myocardial infarction) ([place])   . Osteopenia   . Premature menopause [age]  . Seasonal allergies    now mostly yearlong  . Seizure ([place]) 2010   per pt, related to lowered seizure threshold from imipramine and tramadol)--saw Dr. Jannifer Franklin  . Smoker     Past Surgical History:  Procedure Laterality Date  . surgery for cervical cancer    . VAGINAL HYSTERECTOMY     fibroids  . WRIST SURGERY     deQuervains    Family History  Problem Relation Age of Onset  . Scoliosis Mother   . Cancer Father 52       testicular cancer  . Autism Son   . Cancer Maternal Uncle        esophageal (smoker)  . Macular degeneration Maternal Grandmother        wet  . Hypertension Maternal Grandmother   . Hyperlipidemia Maternal Grandmother   . Depression Maternal  Grandmother        related to age, loss of vision  . Colitis Maternal Grandmother   . Breast cancer Maternal Grandmother   . Breast cancer Paternal Grandfather   . Diabetes Neg Hx     Social History   Socioeconomic History  . Marital status: Single    Spouse name: Not on file  . Number of children: 1  . Years of education: Not on file  . Highest education level: Not on file  Occupational History  . 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 Smoking status: Every Day    Packs/day: 1.00    Years: 41.00    Pack years: 41.00    Types: Cigarettes  . Smokeless tobacco: Never  Vaping Use  . Vaping Use: Never used  Substance and Sexual Activity  . Alcohol use: Yes    Alcohol/week: 14.0 standard drinks    Types: 14 Cans of beer per week    Comment: 2-3 beers per day.  . Drug use: No  . Sexual activity: Not Currently  Other Topics Concern  . Not on file  Social History Narrative   Lives at home  with 44 year old autistic son.  5 cats, 1 dog.     Social Determinants of Health   Financial Resource Strain: Not on file  Food Insecurity: Not on file  Transportation Needs: Not on file  Physical Activity: Not on file  Stress: Not on file  Social Connections: Not on file  Intimate Partner Violence: Not on file    Outpatient Medications Prior to Visit  Medication Sig Dispense Refill  . irbesartan (AVAPRO) 75 MG tablet Take 1 tablet (75 mg total) by mouth daily. For blood pressure. 90 tablet 3  . lansoprazole (PREVACID) 15 MG capsule Take 15 mg by mouth daily.     . metoprolol tartrate (LOPRESSOR) 25 MG tablet Take 0.5 tablets (12.5 mg total) by mouth 2 (two) times daily. 60 tablet 2  . Vitamin D, Ergocalciferol, (DRISDOL) 1.25 MG (50000 UNIT) CAPS capsule TAKE 1 CAPSULE BY MOUTH ONCE WEEKLY 12 capsule 0  . ibuprofen (ADVIL) 600 MG tablet Take 1 tablet (600 mg total) by mouth every 6 (six) hours as needed (musculoskeletal pain). 30 tablet 0   No  facility-administered medications prior to visit.    Allergies  Allergen Reactions  . Darvon [Propoxyphene Hcl] Nausea And Vomiting    Review of Systems  Constitutional:  Negative for chills and fever.  Respiratory:  Negative for cough and shortness of breath.   Cardiovascular:  Positive for chest pain (MSK.). Negative for leg swelling.  Gastrointestinal:  Negative for diarrhea, nausea and vomiting.  Musculoskeletal:  Positive for arthralgias, back pain and myalgias.  Skin:  Negative for color change, rash and wound.  Neurological:  Positive for numbness. Negative for weakness (Has chronic weakness nothing new).      Objective:    Physical Exam Vitals and nursing note reviewed.  Constitutional:      Appearance: Normal appearance.  Eyes:     Pupils: Pupils are equal, round, and reactive to light.  Cardiovascular:     Rate and Rhythm: Normal rate and regular rhythm.     Pulses:          Radial pulses are 2+ on the right side and 2+ on the left side.       Posterior tibial pulses are 1+ on the right side and 1+ on the left side.  Pulmonary:     Effort: Pulmonary effort is normal.     Breath sounds: Normal breath sounds.  Abdominal:     General: Bowel sounds are normal.  Musculoskeletal:        General: Tenderness and signs of injury present. No deformity.     Thoracic back: Tenderness and bony tenderness present. No edema, deformity or signs of trauma.     Lumbar back: No tenderness or bony tenderness. Positive left straight leg raise test. Negative right straight leg raise test.       Back:     Comments: +SLR, left sided MSK chest pain. Better with compression   Neurological:     General: No focal deficit present.     Mental Status: She is alert.     Deep Tendon Reflexes:     Reflex Scores:      Bicep reflexes are 2+ on the right side and 2+ on the left side.      Patellar reflexes are 1+ on the right side and 1+ on the left side.    Comments: Break through weakness  on left lower extremity when doing SLR  Psychiatric:  Mood and Affect: Mood normal.        Behavior: Behavior normal.        Thought Content: Thought content normal.        Judgment: Judgment normal.    BP 132/76   Pulse 83   Temp 98.7 F (37.1 C)   Resp 14   Ht '5\' 6"'$  (1.676 m)   Wt 110 lb 6 oz (50.1 kg)   SpO2 97%   BMI 17.81 kg/m  Wt Readings from Last 3 Encounters:  07/14/21 110 lb 6 oz (50.1 kg)  12/17/20 113 lb (51.3 kg)  12/06/20 112 lb 3.2 oz (50.9 kg)    Health Maintenance Due  Topic Date Due  . Pneumococcal Vaccine 51-22 Years old (1 - PCV) Never done  . COLONOSCOPY (Pts 45-82yr Insurance coverage will need to be confirmed)  Never done  . Zoster Vaccines- Shingrix (1 of 2) Never done  . COVID-19 Vaccine (4 - Booster for Moderna series) 02/03/2021  . INFLUENZA VACCINE  06/02/2021    There are no preventive care reminders to display for this patient.   Lab Results  Component Value Date   TSH 1.17 05/31/2020   Lab Results  Component Value Date   WBC 8.6 08/23/2020   HGB 12.5 08/23/2020   HCT 37.5 08/23/2020   MCV 103.0 (H) 08/23/2020   PLT 219.0 08/23/2020   Lab Results  Component Value Date   NA 132 (L) 08/23/2020   K 3.7 08/23/2020   CO2 24 08/23/2020   GLUCOSE 104 (H) 08/23/2020   BUN 9 08/23/2020   CREATININE 0.67 08/23/2020   BILITOT 2.3 (H) 07/18/2020   ALKPHOS 101 07/18/2020   AST 30 07/18/2020   ALT 15 07/18/2020   PROT 5.8 (L) 07/18/2020   ALBUMIN 3.0 (L) 07/18/2020   CALCIUM 8.9 08/23/2020   ANIONGAP 11 07/20/2020   GFR 96.84 08/23/2020   Lab Results  Component Value Date   CHOL 154 07/17/2020   Lab Results  Component Value Date   HDL 37 (L) 07/17/2020   Lab Results  Component Value Date   LDLCALC 100 (H) 07/17/2020   Lab Results  Component Value Date   TRIG 84 07/17/2020   Lab Results  Component Value Date   CHOLHDL 4.2 07/17/2020   Lab Results  Component Value Date   HGBA1C 5.3 07/09/2017        Assessment & Plan:   Problem List Items Addressed This Visit       Other   Thoracic spine pain - Primary    Patient presents for acute thoracic spine pain.  States it started after patient was lifting a heavy cooler plate to get to the car patient did not feel a pop or snap dates approximately 30 minutes after activity the pain developed and has since increased.  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  Patient has tried over-the-counter salves and Tylenol without much relief.  States she was told to stay away from ibuprofen if she can help.  No history of GI bleed per patient report.  Has had back troubles in the past that responds to steroids.  Will start steroid taper, sent to pharmacy on file.  Obtain x-ray of thoracic spine pending results.      Relevant Medications   predniSONE (DELTASONE) 20 MG tablet   lidocaine (LIDODERM) 5 %   Other Relevant Orders   DG  Thoracic Spine 2 View     No orders of the defined types were placed in this encounter.    Romilda Garret, NP

## 2021-07-14 NOTE — Assessment & Plan Note (Signed)
Patient presents for acute thoracic spine pain.  States it started after patient was lifting a heavy cooler plate to get to the car patient did not feel a pop or snap dates approximately 30 minutes after activity the pain developed and has since increased.  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  Patient has tried over-the-counter salves and Tylenol without much relief.  States she was told to stay away from ibuprofen if she can help.  No history of GI bleed per patient report.  Has had back troubles in the past that responds to steroids.  Will start steroid taper, sent to pharmacy on file.  Obtain x-ray of thoracic spine pending results.

## 2021-07-14 NOTE — Telephone Encounter (Signed)
Noted will see patient in office 

## 2021-07-14 NOTE — Patient Instructions (Signed)
Nice to see you I will be in touch with the xray results Follow up if symptoms fail to improve or get worse.

## 2021-07-16 ENCOUNTER — Encounter: Payer: Self-pay | Admitting: Nurse Practitioner

## 2021-07-16 NOTE — Telephone Encounter (Signed)
Matt, can you take a look since you saw her?

## 2021-07-17 ENCOUNTER — Other Ambulatory Visit: Payer: Self-pay | Admitting: Nurse Practitioner

## 2021-07-17 ENCOUNTER — Telehealth: Payer: Self-pay | Admitting: Primary Care

## 2021-07-17 DIAGNOSIS — M546 Pain in thoracic spine: Secondary | ICD-10-CM

## 2021-07-17 DIAGNOSIS — M8589 Other specified disorders of bone density and structure, multiple sites: Secondary | ICD-10-CM

## 2021-07-17 DIAGNOSIS — M792 Neuralgia and neuritis, unspecified: Secondary | ICD-10-CM

## 2021-07-17 MED ORDER — PREGABALIN 50 MG PO CAPS: 50.0000 mg | ORAL_CAPSULE | Freq: Two times a day (BID) | ORAL | 0 refills | Status: DC

## 2021-07-17 NOTE — Telephone Encounter (Signed)
Mrs Amber Roberts called in and stated that she got [facility] that stated that she got a prescription for predisone but it was suppose to be pregabalin (LYRICA) 50 MG capsule.  And [facility] [address]

## 2021-07-17 NOTE — Telephone Encounter (Signed)
[facility] and confirmed that Amber Roberts was received and is ready for pick up, not sure why the automated response said Prednisone. I called patient and let her know about this also. Nothing further is needed

## 2021-07-17 NOTE — Telephone Encounter (Signed)
Not needed

## 2021-07-23 NOTE — Telephone Encounter (Signed)
Pt called stating when she was on prednisone her back was better, but she finished them 2 days ago and now the pain is much worse again

## 2021-07-24 ENCOUNTER — Other Ambulatory Visit: Payer: Self-pay | Admitting: Nurse Practitioner

## 2021-07-24 DIAGNOSIS — M546 Pain in thoracic spine: Secondary | ICD-10-CM

## 2021-07-24 DIAGNOSIS — M792 Neuralgia and neuritis, unspecified: Secondary | ICD-10-CM

## 2021-07-24 MED ORDER — TRAMADOL HCL 50 MG PO TABS
50.0000 mg | ORAL_TABLET | Freq: Three times a day (TID) | ORAL | 0 refills | Status: AC | PRN
Start: 2021-07-24 — End: 2021-07-29

## 2021-07-24 MED ORDER — PREGABALIN 75 MG PO CAPS: 75.0000 mg | ORAL_CAPSULE | Freq: Two times a day (BID) | ORAL | 0 refills | Status: DC

## 2021-07-31 ENCOUNTER — Telehealth: Payer: Self-pay | Admitting: Primary Care

## 2021-07-31 ENCOUNTER — Other Ambulatory Visit: Payer: Self-pay | Admitting: Primary Care

## 2021-07-31 DIAGNOSIS — E559 Vitamin D deficiency, unspecified: Secondary | ICD-10-CM

## 2021-07-31 NOTE — Telephone Encounter (Signed)
Pt called checking on the status of her CT order for her Thoraic spine

## 2021-07-31 NOTE — Telephone Encounter (Signed)
Please update patient. Thank you

## 2021-07-31 NOTE — Telephone Encounter (Signed)
[employer] portal down this evening  Will have to check on this tomorrow Case ID [id number]

## 2021-08-04 ENCOUNTER — Telehealth: Payer: Self-pay | Admitting: *Deleted

## 2021-08-04 ENCOUNTER — Telehealth: Payer: Self-pay

## 2021-08-04 NOTE — Telephone Encounter (Signed)
Called patient and left a message and also sent mychart message as requested by Romilda Garret,  See notes from [place] below.  FYI to Ashtyn I let patient know in the mychart message the status or Ortho referral and CT scan as noted in the order/referral notes.  "Can we check on the status of the CT scan and referral to ortho. Did not need stat but urgently would be great   Johana Hopkinson can we call and see how the patient is dealing with the back discomfort and see if the lyrica is helping   Thank you both "

## 2021-08-04 NOTE — Telephone Encounter (Signed)
PA started on 07/24/21 for CT scan As of 07/30/21 PA was still pending approval As of this morning, no fax received with authorization or denial.  Looked online to check status and status now updated to "Not approved by [facility]" -  No notes listed as to why denied. Attempted to call and cannot get through on the phones. Note does say to call the [facility] card number or that I may have to repeat the authorization.  I tried calling [employer] to follow up on claim, Ref# [id number]   Received automated message... "Due to advserse weather conditions call holds may be longer than usual.  All circuits are busy, try your call again later."  I attempted to repeat authorization online with [facility] with [facility] to clinical review   CPT Code       72128  Description:     CT T SPINE W/O CONTRAST Case Number: [id number] Review Date:   08/04/2021 12:57:20 PM Expiration Date:          N/A Status: Your case has been sent to clinical review. You will be notified via fax within 2 business days if additional clinical information is needed. If you wish to speak with a representative at anytime, please call [phone].

## 2021-08-04 NOTE — Telephone Encounter (Signed)
-----   Message from Michela Pitcher, NP sent at 08/02/2021  7:49 PM EDT ----- Regarding: CT Can we check on the status of the CT scan and referral to ortho. Did not need stat but urgently would be great  Anastasiya can we call and see how the patient is dealing with the back discomfort and see if the lyrica is helping  Thank you both  Matt C

## 2021-08-04 NOTE — Telephone Encounter (Signed)
PA submitted again via [facility] - PENDING approval   CPT Code       [phone]  Description:     CT T SPINE W/O CONTRAST Case Number: [id number] Review Date:   08/04/2021 12:57:20 PM Expiration Date:          N/A Status: Your case has been sent to clinical review. You will be notified via fax within 2 business days if additional clinical information is needed. If you wish to speak with a representative at anytime, please call [phone].

## 2021-08-04 NOTE — Telephone Encounter (Signed)
See 08/04/21 TE

## 2021-08-07 NOTE — Telephone Encounter (Signed)
Please see referral notes, peer to peer done and approved. Sent mychart message to the patient about this. Per appt desk notes [facility] imaging has called 2 times in the last 2 weeks and left a message for patient to schedule her CT scan.

## 2021-08-19 NOTE — Telephone Encounter (Signed)
See mychart messages

## 2021-08-28 ENCOUNTER — Other Ambulatory Visit: Payer: Self-pay | Admitting: Primary Care

## 2021-08-28 DIAGNOSIS — I10 Essential (primary) hypertension: Secondary | ICD-10-CM

## 2021-09-08 ENCOUNTER — Encounter: Payer: Self-pay | Admitting: *Deleted

## 2021-10-10 ENCOUNTER — Other Ambulatory Visit: Payer: Self-pay | Admitting: Primary Care

## 2021-10-10 ENCOUNTER — Encounter: Payer: Self-pay | Admitting: Primary Care

## 2021-10-10 DIAGNOSIS — E559 Vitamin D deficiency, unspecified: Secondary | ICD-10-CM

## 2021-10-10 DIAGNOSIS — I10 Essential (primary) hypertension: Secondary | ICD-10-CM

## 2021-10-10 MED ORDER — METOPROLOL TARTRATE 25 MG PO TABS: 12.5000 mg | ORAL_TABLET | Freq: Two times a day (BID) | ORAL | 0 refills | Status: DC

## 2021-10-10 MED ORDER — VITAMIN D (ERGOCALCIFEROL) 1.25 MG (50000 UNIT) PO CAPS: 50000.0000 [IU] | ORAL_CAPSULE | ORAL | 0 refills | Status: DC

## 2021-10-10 NOTE — Telephone Encounter (Signed)
Updated.

## 2021-10-13 DIAGNOSIS — M546 Pain in thoracic spine: Secondary | ICD-10-CM

## 2021-10-13 DIAGNOSIS — M792 Neuralgia and neuritis, unspecified: Secondary | ICD-10-CM

## 2021-10-14 MED ORDER — PREDNISONE 20 MG PO TABS: ORAL_TABLET | ORAL | 0 refills | Status: DC

## 2021-10-14 NOTE — Telephone Encounter (Signed)
She was seen in September for this by [facility] you still need to see in office right?

## 2021-11-05 MED ORDER — PREGABALIN 75 MG PO CAPS: 75.0000 mg | ORAL_CAPSULE | Freq: Two times a day (BID) | ORAL | 0 refills | Status: DC | PRN

## 2021-11-05 NOTE — Telephone Encounter (Signed)
[facility] [address] and cancel her Lyrica Rx that I just sent?

## 2021-11-05 NOTE — Telephone Encounter (Signed)
Called pharmacy she will cancel Lyrical now. No further action needed.

## 2021-11-07 NOTE — Telephone Encounter (Signed)
Please call and triage.  

## 2021-11-07 NOTE — Telephone Encounter (Signed)
No problem!  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

## 2021-11-07 NOTE — Telephone Encounter (Signed)
I was just going to call pt and saw note from [name] NP to pt; I am sorry for jsut now calling but have been extremely busy. Thank you Anda Kraft.

## 2021-12-11 ENCOUNTER — Telehealth: Payer: Self-pay

## 2021-12-11 NOTE — Telephone Encounter (Signed)
[place] Night - Client Nonclinical Telephone Record  [facility] Client [facility] Night - Client Client Site [place] - Night Provider Alma Friendly - NP Contact Type Call Who Is Calling Patient / Member / Family / Caregiver Caller Name [place] Phone Number [phone] Patient Name Amber Roberts Patient DOB June 17, 1961 Call Type Message Only Information Provided Reason for Call Request to Schedule Office Appointment Initial Comment Caller states she needs to make an appointment. Patient request to speak to RN No Additional Comment Provided hours. Disp. Time Disposition Final User 12/10/2021 3:47:52 PM General Information Provided Yes Rosario Jacks Call Closed By: Rosario Jacks Transaction Date/Time: 12/10/2021 3:45:04 PM (ET

## 2021-12-15 NOTE — Telephone Encounter (Signed)
LMTCB to schedule a appt

## 2021-12-19 ENCOUNTER — Ambulatory Visit: Payer: 59 | Admitting: Primary Care

## 2021-12-23 ENCOUNTER — Other Ambulatory Visit: Payer: Self-pay | Admitting: Primary Care

## 2021-12-23 DIAGNOSIS — M792 Neuralgia and neuritis, unspecified: Secondary | ICD-10-CM

## 2021-12-25 ENCOUNTER — Other Ambulatory Visit: Payer: Self-pay | Admitting: Primary Care

## 2021-12-25 DIAGNOSIS — M792 Neuralgia and neuritis, unspecified: Secondary | ICD-10-CM

## 2021-12-30 ENCOUNTER — Encounter: Payer: Self-pay | Admitting: Primary Care

## 2021-12-30 ENCOUNTER — Other Ambulatory Visit: Payer: Self-pay | Admitting: Primary Care

## 2021-12-30 ENCOUNTER — Ambulatory Visit (INDEPENDENT_AMBULATORY_CARE_PROVIDER_SITE_OTHER): Payer: 59 | Admitting: Primary Care

## 2021-12-30 ENCOUNTER — Other Ambulatory Visit: Payer: Self-pay

## 2021-12-30 VITALS — BP 136/78 | HR 85 | Temp 98.1°F | Ht 66.0 in | Wt 123.0 lb

## 2021-12-30 DIAGNOSIS — M542 Cervicalgia: Secondary | ICD-10-CM

## 2021-12-30 DIAGNOSIS — M8589 Other specified disorders of bone density and structure, multiple sites: Secondary | ICD-10-CM | POA: Diagnosis not present

## 2021-12-30 DIAGNOSIS — R002 Palpitations: Secondary | ICD-10-CM

## 2021-12-30 DIAGNOSIS — I700 Atherosclerosis of aorta: Secondary | ICD-10-CM | POA: Diagnosis not present

## 2021-12-30 DIAGNOSIS — E7800 Pure hypercholesterolemia, unspecified: Secondary | ICD-10-CM

## 2021-12-30 DIAGNOSIS — R000 Tachycardia, unspecified: Secondary | ICD-10-CM

## 2021-12-30 DIAGNOSIS — E559 Vitamin D deficiency, unspecified: Secondary | ICD-10-CM

## 2021-12-30 DIAGNOSIS — G8929 Other chronic pain: Secondary | ICD-10-CM

## 2021-12-30 DIAGNOSIS — I10 Essential (primary) hypertension: Secondary | ICD-10-CM | POA: Diagnosis not present

## 2021-12-30 DIAGNOSIS — M546 Pain in thoracic spine: Secondary | ICD-10-CM

## 2021-12-30 DIAGNOSIS — I2510 Atherosclerotic heart disease of native coronary artery without angina pectoris: Secondary | ICD-10-CM

## 2021-12-30 LAB — COMPREHENSIVE METABOLIC PANEL
ALT: 15 U/L (ref 0–35)
AST: 24 U/L (ref 0–37)
Albumin: 4.5 g/dL (ref 3.5–5.2)
Alkaline Phosphatase: 82 U/L (ref 39–117)
BUN: 13 mg/dL (ref 6–23)
CO2: 27 mEq/L (ref 19–32)
Calcium: 9.9 mg/dL (ref 8.4–10.5)
Chloride: 97 mEq/L (ref 96–112)
Creatinine, Ser: 0.67 mg/dL (ref 0.40–1.20)
GFR: 95.92 mL/min (ref >60.00)
Glucose, Bld: 82 mg/dL (ref 70–99)
Potassium: 4.1 mEq/L (ref 3.5–5.1)
Sodium: 133 mEq/L — ABNORMAL LOW (ref 135–145)
Total Bilirubin: 0.4 mg/dL (ref 0.2–1.2)
Total Protein: 7.6 g/dL (ref 6.0–8.3)

## 2021-12-30 LAB — CBC
HCT: 36.2 % (ref 36.0–46.0)
Hemoglobin: 12.4 g/dL (ref 12.0–15.0)
MCHC: 34.2 g/dL (ref 30.0–36.0)
MCV: 102.8 fl — ABNORMAL HIGH (ref 78.0–100.0)
Platelets: 184 10*3/uL (ref 150.0–400.0)
RBC: 3.52 Mil/uL — ABNORMAL LOW (ref 3.87–5.11)
RDW: 15.4 % (ref 11.5–15.5)
WBC: 7 10*3/uL (ref 4.0–10.5)

## 2021-12-30 LAB — LIPID PANEL
Cholesterol: 320 mg/dL — ABNORMAL HIGH (ref 0–200)
HDL: 54.5 mg/dL (ref >39.00)
Total CHOL/HDL Ratio: 6
Triglycerides: 1240 mg/dL — ABNORMAL HIGH (ref 0.0–149.0)

## 2021-12-30 LAB — LDL CHOLESTEROL, DIRECT: Direct LDL: 136 mg/dL

## 2021-12-30 LAB — VITAMIN D 25 HYDROXY (VIT D DEFICIENCY, FRACTURES): VITD: 33.27 ng/mL (ref 30.00–100.00)

## 2021-12-30 MED ORDER — ATORVASTATIN CALCIUM 20 MG PO TABS: 20.0000 mg | ORAL_TABLET | Freq: Every day | ORAL | 0 refills | Status: DC

## 2021-12-30 NOTE — Progress Notes (Signed)
Subjective:    Patient ID: Amber Roberts, female    DOB: 06/27/1961, 61 y.o.   MRN: [id number]  HPI  Amber Roberts is a very pleasant 61 y.o. female with a history of CAD, aortic atherosclerosis, hypertension, GERD, osteoarthritis, tobacco abuse, pancreatitis, chronic neck pain, chronic thoracic back pain who presents today for follow-up of chronic conditions.  1) Chronic Neck/Back Pain: Chronic for years. She is mostly concerned about her thoracic back pain. This pain began progressing in Fall 2022 since changing her job role. 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   She is managed on Lyrica 75 mg for which was originally initiated by neurosurgery years ago. Resumed daily in September 2022 for about 8 weeks, resumed again daily in January 2023.  She has tried gabapentin in the past which was ineffective. Lyrica is effective.   She hasn't returned to neurosurgery regarding her neck as she cannot afford it, also does not want to proceed with surgery at this time.   She was summoned for [employer] Duty to serve April 5th. Her juror number is [phone]. She is requesting excuse for jury duty as she cannot sit all day long without getting up to walk secondary to chronic back pain. She is the caregiver of her mother and elderly roommate who cannot be left along for prolonged periods of time.   2) Hypertension/CAD: Currently managed on irbesartan 75 mg daily. She stopped taking irbesartan four days ago due to symptoms of nausea and dizziness that occurred while at work. She checked her BP  after one of these episodes which was 90/56. She's checked her BP at few times since which has been in the 130's/70's.  She is under a lot of stress in her personal life. Is taking care of her boyfriend, mother, and friend.   3) Osteopenia: Currently managed on vitamin D 50,000 unit capsules for which she takes once weekly.  She is scheduled for a bone density scan.  She does not  take calcium as she often forgets.  BP Readings from Last 3 Encounters:  12/30/21 136/78  07/14/21 132/76  12/17/20 126/82   Wt Readings from Last 3 Encounters:  12/30/21 123 lb (55.8 kg)  07/14/21 110 lb 6 oz (50.1 kg)  12/17/20 113 lb (51.3 kg)        Review of Systems  Respiratory:  Negative for shortness of breath.   Cardiovascular:  Negative for chest pain.  Musculoskeletal:  Positive for arthralgias, back pain and neck pain.        Past Medical History:  Diagnosis Date   Abnormal Pap smear of vagina    abnl paps of vaginal cuff; last pap 11/2003   Cervical cancer ([place]) early 20's   Chest pain    Fibromyalgia    GERD (gastroesophageal reflux disease)    Hyperlipidemia    MI (myocardial infarction) ([place])    Osteopenia    Premature menopause [age]   Seasonal allergies    now mostly yearlong   Seizure ([place]) 2010   per pt, related to lowered seizure threshold from imipramine and tramadol)--saw Dr. 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   Drug use: No   Sexual activity: Not Currently  Other Topics Concern   Not on file  Social History Narrative   Lives at home with 39 year old autistic son.  5 cats, 1 dog.     Social Determinants of Health   Financial Resource Strain: Not on file  Food Insecurity: Not on file  Transportation Needs: Not on file  Physical Activity: Not on  file  Stress: Not on file  Social Connections: Not on file  Intimate Partner Violence: Not on file    Past Surgical History:  Procedure Laterality Date   surgery for cervical cancer     VAGINAL HYSTERECTOMY     fibroids   WRIST SURGERY     deQuervains    Family History  Problem Relation Age of Onset   Scoliosis Mother    Cancer Father 57       testicular cancer   Autism Son    Cancer Maternal Uncle        esophageal (smoker)   Macular degeneration Maternal Grandmother        wet   Hypertension Maternal Grandmother    Hyperlipidemia Maternal Grandmother    Depression Maternal Grandmother        related to age, loss of vision   Colitis Maternal Grandmother    Breast cancer Maternal Grandmother    Breast cancer Paternal Grandfather    Diabetes Neg Hx     Allergies  Allergen Reactions   Darvon [Propoxyphene Hcl] Nausea And Vomiting    Current Outpatient Medications on File Prior to Visit  Medication Sig Dispense Refill   lansoprazole (PREVACID) 15 MG capsule Take 15 mg by mouth daily.      Loratadine 10 MG CAPS      metoprolol tartrate (LOPRESSOR) 25 MG tablet Take 0.5 tablets (12.5 mg total) by mouth 2 (two) times daily. Office visit required for further refills. 90 tablet 0   OVER THE COUNTER MEDICATION Take 15 mg by mouth at bedtime. CBD two tabs at bedtime.     pregabalin (LYRICA) 75 MG capsule TAKE 1 CAPSULE BY MOUTH TWICE DAILY AS NEEDED FOR PAIN 10 capsule 0   Vitamin D, Ergocalciferol, (DRISDOL) 1.25 MG (50000 UNIT) CAPS capsule Take 1 capsule (50,000 Units total) by mouth once a week. Office visit required for further refills. 12 capsule 0   irbesartan (AVAPRO) 75 MG tablet Take 1 tablet (75 mg total) by mouth daily. For blood pressure. (Patient not taking: Reported on 12/30/2021) 90 tablet 3   No current facility-administered medications on file prior to visit.    BP 136/78    Pulse 85    Temp 98.1 F (36.7 C) (Oral)    Ht 5\' 6"  (1.676 m)    Wt 123 lb (55.8  kg)    SpO2 98%    BMI 19.85 kg/m  Objective:   Physical Exam Cardiovascular:     Rate and Rhythm: Normal rate and regular rhythm.  Pulmonary:     Effort: Pulmonary effort is normal.  Breath sounds: Normal breath sounds.  Musculoskeletal:     Cervical back: Neck supple.  Skin:    General: Skin is warm and dry.  Psychiatric:        Mood and Affect: Mood normal.          Assessment & Plan:      This visit occurred during the SARS-CoV-2 public health emergency.  Safety protocols were in place, including screening questions prior to the visit, additional usage of staff PPE, and extensive cleaning of exam room while observing appropriate contact time as indicated for disinfecting solutions.

## 2021-12-30 NOTE — Assessment & Plan Note (Signed)
Continue vitamin D 50,000 IU once weekly. Repeat vitamin D level pending.

## 2021-12-30 NOTE — Assessment & Plan Note (Signed)
Borderline high today.  Resume irbesartan 75 mg and have start with 1/2 tablet daily, increase to 1 full tablet thereafter if BP does not improve.   CMP pending.

## 2021-12-30 NOTE — Assessment & Plan Note (Signed)
Not not currently on statin therapy, but history of CAD would recommend if LDL is above 70.  Discussed with patient today, she agrees.  Repeat lipid panel pending.

## 2021-12-30 NOTE — Assessment & Plan Note (Signed)
Bone density scan pending.  Non compliant to calcium. She is compliant to vitamin D.   Repeat labs pending.

## 2021-12-30 NOTE — Assessment & Plan Note (Signed)
Controlled.  Continue metoprolol tartrate 12.5 mg twice daily.

## 2021-12-30 NOTE — Patient Instructions (Signed)
Stop by the lab prior to leaving today. I will notify you of your results once received.   Resume irbesartan by taking 1/2 tablet by mouth once daily. Monitor your blood pressure as discussed.  It was a pleasure to see you today!

## 2021-12-30 NOTE — Assessment & Plan Note (Signed)
Deteriorated.  Strongly advise she work on stretching and getting up from her desk every hour or so.  Continue Lyrica 75 mg as needed.

## 2021-12-30 NOTE — Assessment & Plan Note (Signed)
Not on statin therapy.  Repeat lipid panel pending. Consider statin therapy if warranted.

## 2021-12-30 NOTE — Assessment & Plan Note (Signed)
Stable.  No concerns today.  Previously followed with neurosurgery, but not now as she does not wish to pursue surgery

## 2021-12-30 NOTE — Assessment & Plan Note (Addendum)
Chronic.  Likely aggravated from her new position which requires sitting all day long.  Strongly advised that she continue to work on getting up every few hours.  Continue Lyrica 75 mg daily as needed.

## 2021-12-30 NOTE — Assessment & Plan Note (Signed)
Controlled.  Continue metoprolol tartrate 12.5 mg once to twice daily.

## 2021-12-30 NOTE — Assessment & Plan Note (Addendum)
Asymptomatic.  Continue to monitor. Checking lipids and CMP today.   Not on statin therapy.  Discussed that if LDL was above 70 that we should proceed with statin therapy.  She agreed.

## 2022-01-02 ENCOUNTER — Ambulatory Visit
Admission: RE | Admit: 2022-01-02 | Discharge: 2022-01-02 | Disposition: A | Discharge status: Home / Self Care | Payer: 59 | Source: Ambulatory Visit | Attending: Nurse Practitioner | Admitting: Nurse Practitioner

## 2022-01-02 ENCOUNTER — Other Ambulatory Visit: Payer: Self-pay

## 2022-01-02 DIAGNOSIS — M8589 Other specified disorders of bone density and structure, multiple sites: Secondary | ICD-10-CM

## 2022-01-04 DIAGNOSIS — E559 Vitamin D deficiency, unspecified: Secondary | ICD-10-CM

## 2022-01-04 DIAGNOSIS — M810 Age-related osteoporosis without current pathological fracture: Secondary | ICD-10-CM

## 2022-01-06 MED ORDER — ALENDRONATE SODIUM 70 MG PO TABS: 70.0000 mg | ORAL_TABLET | ORAL | 3 refills | Status: DC

## 2022-01-09 MED ORDER — ALENDRONATE SODIUM 70 MG PO TABS: 70.0000 mg | ORAL_TABLET | ORAL | 3 refills | Status: DC

## 2022-02-01 MED ORDER — VITAMIN D (ERGOCALCIFEROL) 1.25 MG (50000 UNIT) PO CAPS: 50000.0000 [IU] | ORAL_CAPSULE | ORAL | 1 refills | Status: DC

## 2022-02-03 NOTE — Telephone Encounter (Signed)
Faxed letter to [facility]   received confirmed that the letter when though.   ?

## 2022-02-03 NOTE — Telephone Encounter (Signed)
Louretta Shorten, ?Will you please fax this letter ASAP? I placed the form on Joellen's desk. ? ?Thanks! ?[name], NP-C ? ?

## 2022-02-12 ENCOUNTER — Other Ambulatory Visit: Payer: Self-pay | Admitting: Primary Care

## 2022-02-12 DIAGNOSIS — M792 Neuralgia and neuritis, unspecified: Secondary | ICD-10-CM

## 2022-02-12 DIAGNOSIS — I10 Essential (primary) hypertension: Secondary | ICD-10-CM

## 2022-02-12 MED ORDER — METOPROLOL TARTRATE 25 MG PO TABS: 12.5000 mg | ORAL_TABLET | Freq: Two times a day (BID) | ORAL | 2 refills | Status: DC

## 2022-02-12 MED ORDER — PREGABALIN 75 MG PO CAPS: 75.0000 mg | ORAL_CAPSULE | Freq: Every day | ORAL | 0 refills | Status: DC | PRN

## 2022-02-13 ENCOUNTER — Other Ambulatory Visit: Payer: Self-pay | Admitting: Primary Care

## 2022-02-13 ENCOUNTER — Other Ambulatory Visit (HOSPITAL_BASED_OUTPATIENT_CLINIC_OR_DEPARTMENT_OTHER): Payer: Self-pay | Admitting: Primary Care

## 2022-02-13 DIAGNOSIS — M792 Neuralgia and neuritis, unspecified: Secondary | ICD-10-CM

## 2022-02-13 DIAGNOSIS — Z1211 Encounter for screening for malignant neoplasm of colon: Secondary | ICD-10-CM

## 2022-02-13 DIAGNOSIS — Z1231 Encounter for screening mammogram for malignant neoplasm of breast: Secondary | ICD-10-CM

## 2022-03-13 ENCOUNTER — Ambulatory Visit (HOSPITAL_BASED_OUTPATIENT_CLINIC_OR_DEPARTMENT_OTHER)
Admission: RE | Admit: 2022-03-13 | Discharge: 2022-03-13 | Disposition: A | Discharge status: Home / Self Care | Payer: 59 | Source: Ambulatory Visit | Attending: Primary Care | Admitting: Primary Care

## 2022-03-13 DIAGNOSIS — Z1231 Encounter for screening mammogram for malignant neoplasm of breast: Secondary | ICD-10-CM | POA: Insufficient documentation

## 2022-04-02 ENCOUNTER — Other Ambulatory Visit: Payer: Self-pay | Admitting: Primary Care

## 2022-04-02 DIAGNOSIS — M792 Neuralgia and neuritis, unspecified: Secondary | ICD-10-CM

## 2022-04-10 IMAGING — US US ABDOMEN LIMITED
1 series · 14 of 25 positions shown · non-contrast
Comparison: Chest CT including portions of the upper abdomen
July 15, 2020

CLINICAL DATA: Acute pancreatitis

EXAM:
ULTRASOUND ABDOMEN LIMITED RIGHT UPPER QUADRANT

[Series 1: us abdomen limited · 43 acquisitions, 14 frames shown]
[im 1/43]
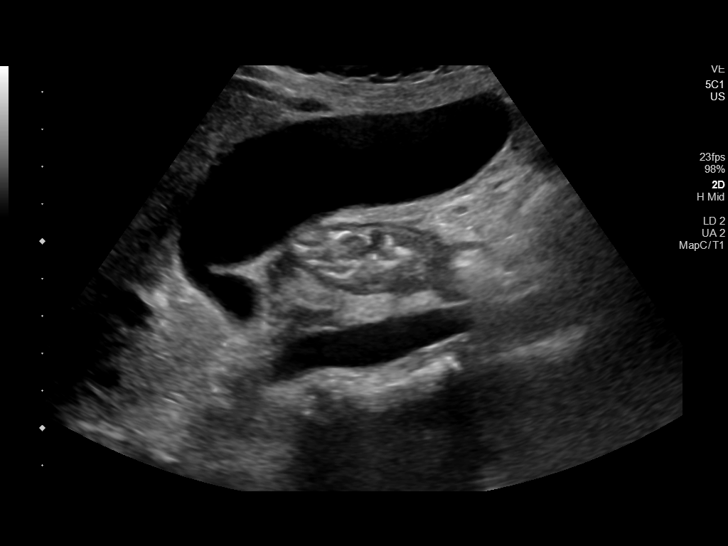
[im 4/43]
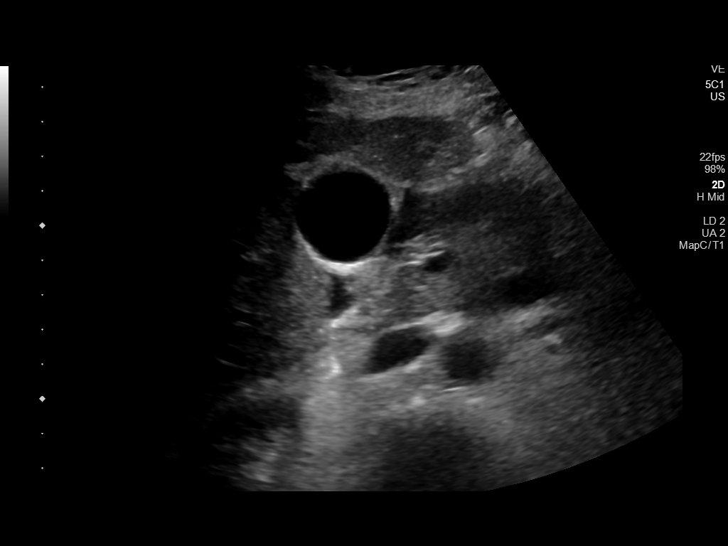
[im 8/43]
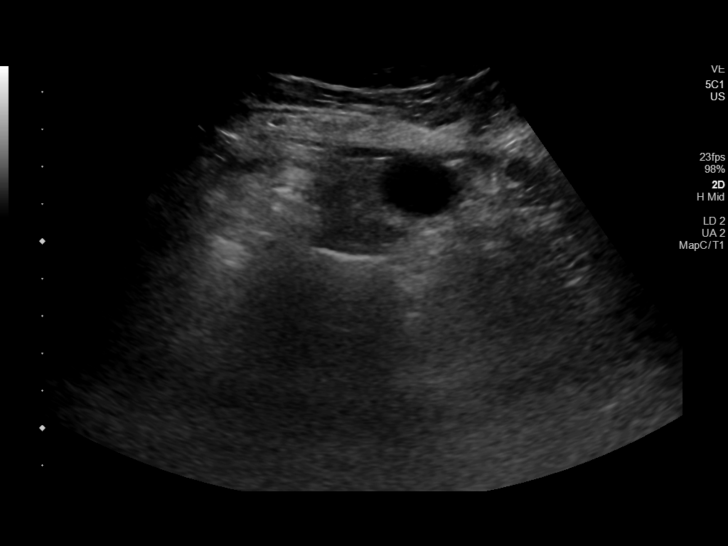
[im 11/43]
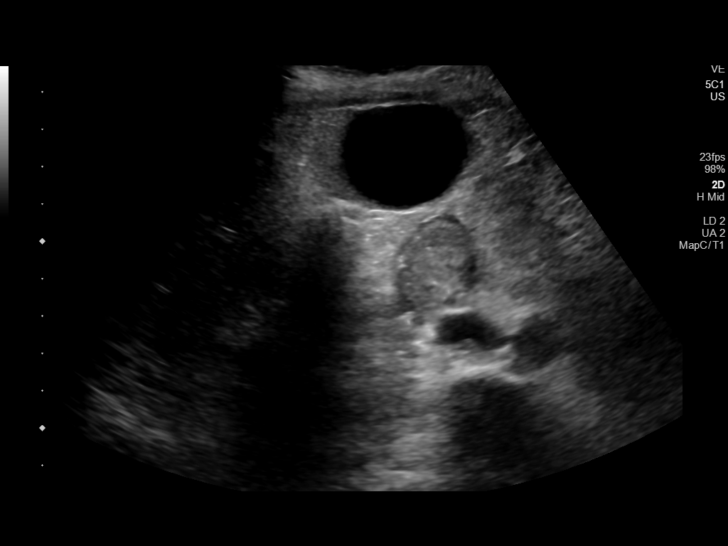
[im 15/43]
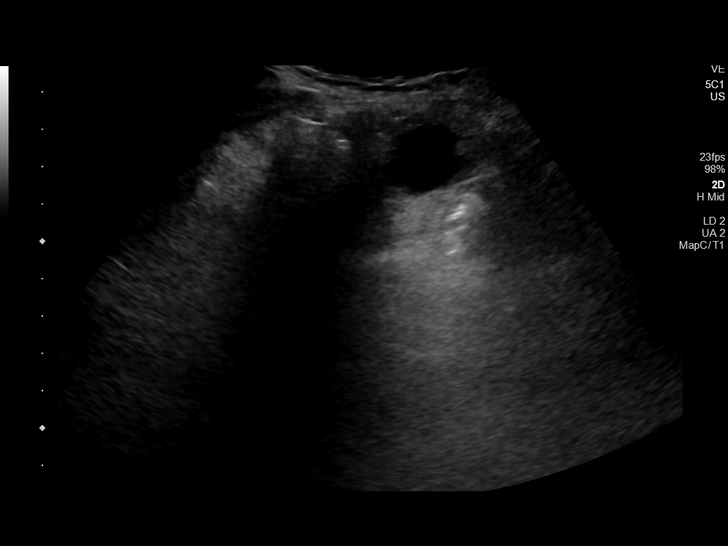
[im 16/43]
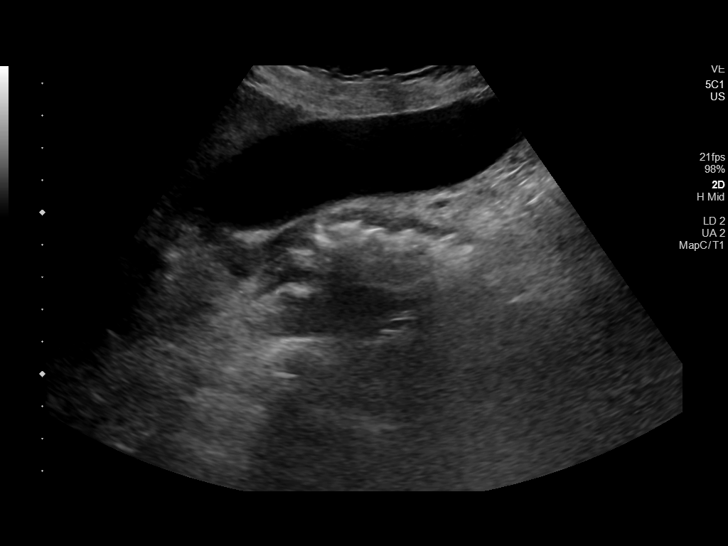
[im 20/43]
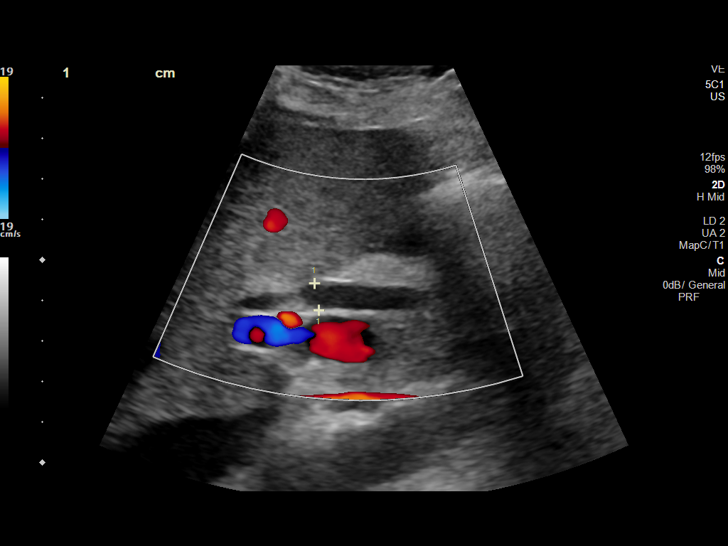
[im 23/43]
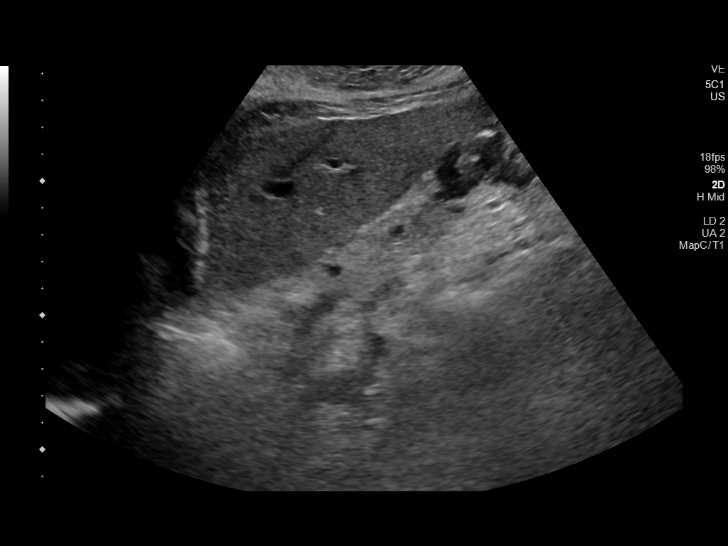
[im 27/43]
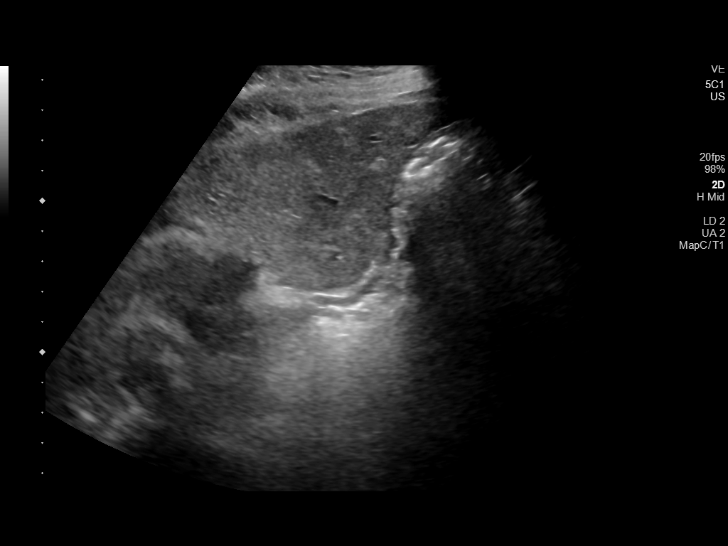
[im 29/43]
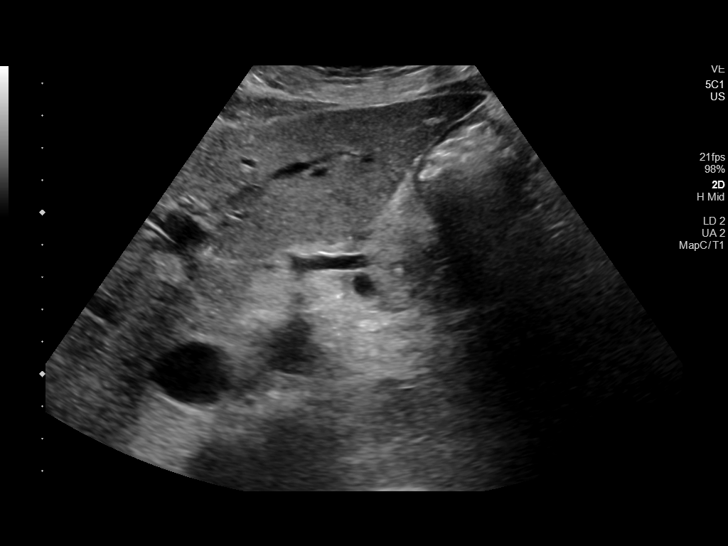
[im 32/43]
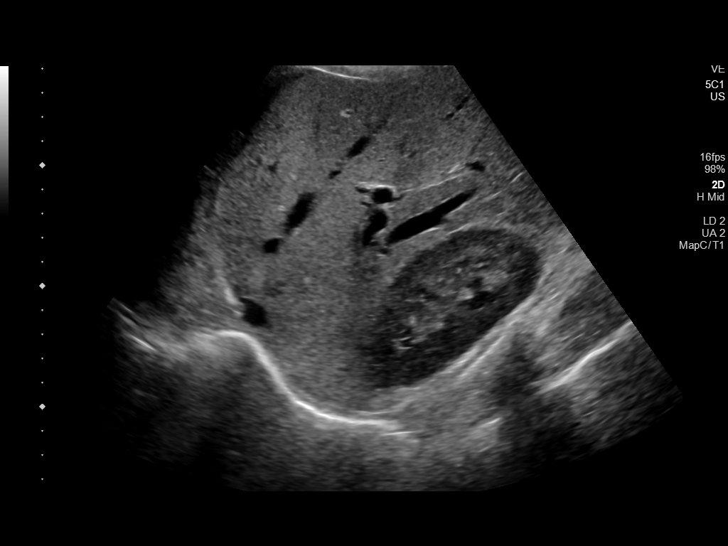
[im 36/43]
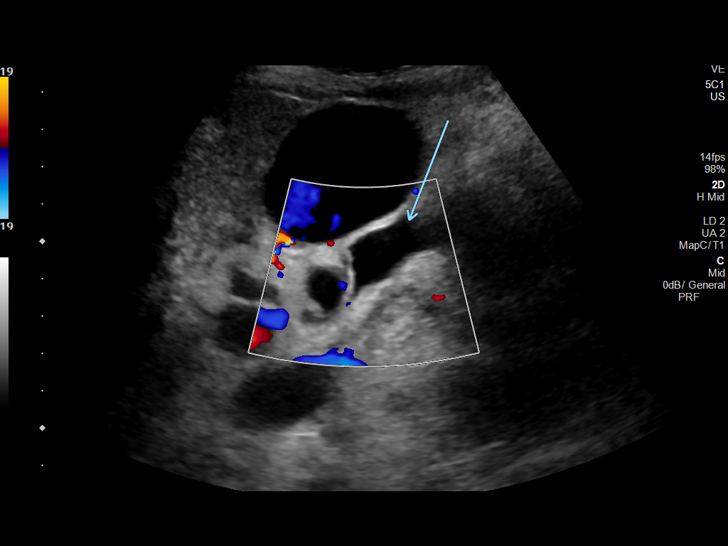
[im 39/43]
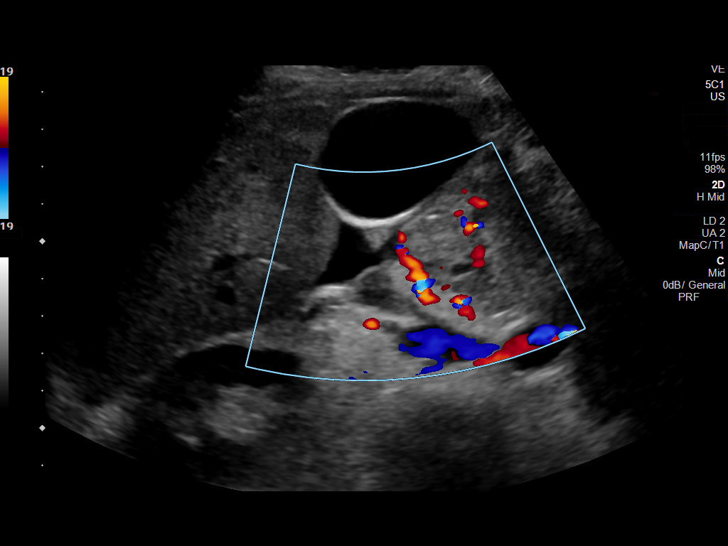
[im 43/43]
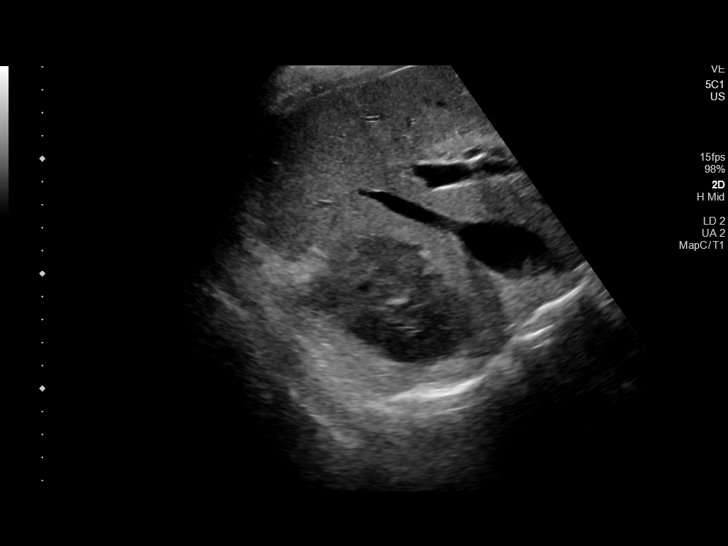

[14 of 25 positions shown; findings below may reference images not displayed]

FINDINGS: Gallbladder:

No gallstones or wall thickening visualized. There is no
pericholecystic fluid. No sonographic Murphy sign noted by
sonographer.

Common bile duct:

Diameter: 7 mm, upper normal. No intrahepatic, common hepatic, or
common bile duct dilatation.

Liver:

No focal liver lesions. Liver echogenicity is mildly inhomogeneous
which may indicate foci of fatty infiltration. Portal vein is patent
on color Doppler imaging with normal direction of blood flow towards
the liver.

Other: Mild ascites.
IMPRESSION: 1.  Mild ascites.

2. Inhomogeneous echotexture in the liver, a finding likely
indicative of focal fatty infiltration. No focal liver lesions
evident.

3.  Study otherwise unremarkable.

## 2022-04-20 ENCOUNTER — Emergency Department (HOSPITAL_COMMUNITY): Payer: 59

## 2022-04-20 ENCOUNTER — Emergency Department (HOSPITAL_COMMUNITY)
Admission: EM | Admit: 2022-04-20 | Discharge: 2022-04-20 | Disposition: A | Discharge status: Home / Self Care | Payer: 59 | Attending: Emergency Medicine | Admitting: Emergency Medicine

## 2022-04-20 ENCOUNTER — Encounter (HOSPITAL_COMMUNITY): Payer: Self-pay

## 2022-04-20 ENCOUNTER — Other Ambulatory Visit: Payer: Self-pay

## 2022-04-20 DIAGNOSIS — R519 Headache, unspecified: Secondary | ICD-10-CM | POA: Diagnosis not present

## 2022-04-20 DIAGNOSIS — Z79899 Other long term (current) drug therapy: Secondary | ICD-10-CM | POA: Diagnosis not present

## 2022-04-20 LAB — COMPREHENSIVE METABOLIC PANEL
ALT: 27 U/L (ref 0–44)
AST: 42 U/L — ABNORMAL HIGH (ref 15–41)
Albumin: 4.5 g/dL (ref 3.5–5.0)
Alkaline Phosphatase: 85 U/L (ref 38–126)
Anion gap: 14 (ref 5–15)
BUN: 11 mg/dL (ref 6–20)
CO2: 20 mmol/L — ABNORMAL LOW (ref 22–32)
Calcium: 9.6 mg/dL (ref 8.9–10.3)
Chloride: 99 mmol/L (ref 98–111)
Creatinine, Ser: 0.73 mg/dL (ref 0.44–1.00)
GFR, Estimated: >60 mL/min (ref >60)
Glucose, Bld: 119 mg/dL — ABNORMAL HIGH (ref 70–99)
Potassium: 3.5 mmol/L (ref 3.5–5.1)
Sodium: 133 mmol/L — ABNORMAL LOW (ref 135–145)
Total Bilirubin: 1.7 mg/dL — ABNORMAL HIGH (ref 0.3–1.2)
Total Protein: 7.9 g/dL (ref 6.5–8.1)

## 2022-04-20 LAB — CBC WITH DIFFERENTIAL/PLATELET
Abs Immature Granulocytes: 0.01 10*3/uL (ref 0.00–0.07)
Basophils Absolute: 0 10*3/uL (ref 0.0–0.1)
Basophils Relative: 0 %
Eosinophils Absolute: 0 10*3/uL (ref 0.0–0.5)
Eosinophils Relative: 1 %
HCT: 39.9 % (ref 36.0–46.0)
Hemoglobin: 14 g/dL (ref 12.0–15.0)
Immature Granulocytes: 0 %
Lymphocytes Relative: 22 %
Lymphs Abs: 1.2 10*3/uL (ref 0.7–4.0)
MCH: 34.5 pg — ABNORMAL HIGH (ref 26.0–34.0)
MCHC: 35.1 g/dL (ref 30.0–36.0)
MCV: 98.3 fL (ref 80.0–100.0)
Monocytes Absolute: 0.7 10*3/uL (ref 0.1–1.0)
Monocytes Relative: 12 %
Neutro Abs: 3.7 10*3/uL (ref 1.7–7.7)
Neutrophils Relative %: 65 %
Platelets: 172 10*3/uL (ref 150–400)
RBC: 4.06 MIL/uL (ref 3.87–5.11)
RDW: 14 % (ref 11.5–15.5)
WBC: 5.7 10*3/uL (ref 4.0–10.5)
nRBC: 0 % (ref 0.0–0.2)

## 2022-04-20 MED ORDER — SODIUM CHLORIDE 0.9 % IV BOLUS
1000.0000 mL | Freq: Once | INTRAVENOUS | Status: AC
Start: 2022-04-20 — End: 2022-04-20
  Administered 2022-04-20: 1000 mL via INTRAVENOUS

## 2022-04-20 MED ORDER — PROCHLORPERAZINE EDISYLATE 10 MG/2ML IJ SOLN
10.0000 mg | Freq: Once | INTRAMUSCULAR | Status: AC
  Administered 2022-04-20: 10 mg via INTRAVENOUS
  Filled 2022-04-20: qty 2

## 2022-04-20 MED ORDER — DEXAMETHASONE 4 MG PO TABS
10.0000 mg | ORAL_TABLET | Freq: Once | ORAL | Status: AC
  Administered 2022-04-20: 10 mg via ORAL
  Filled 2022-04-20: qty 1

## 2022-04-20 MED ORDER — DIPHENHYDRAMINE HCL 50 MG/ML IJ SOLN
25.0000 mg | Freq: Once | INTRAMUSCULAR | Status: AC
  Administered 2022-04-20: 25 mg via INTRAVENOUS
  Filled 2022-04-20: qty 1

## 2022-04-20 NOTE — ED Provider Notes (Signed)
[facility] DEPT Provider Note   CSN: [id number] Arrival date & time: 04/20/22  0254     History  Chief Complaint  Patient presents with   Headache    Amber Roberts is a 61 y.o. female.  61 yo F with a chief complaints of a headache.  This is been going on for about a week now.  She has a dull sensation to the left frontal region and then yesterday morning felt like it all of a sudden got significantly worse.  She spent all day in bed and has been vomiting quite a bit.  Has a remote history of headaches but has not had them in about 20 years or so.  She denies trauma to the head.  Has chronic neck pain has not changed.  Denies one-sided numbness or weakness denies difficulty speech or swallowing.  Denies cough congestion or fever.  Her pain is gotten a little bit better today but has persisted and so came to the department for evaluation.  She also had a day last week where she felt very dizzy.  This lasted all day but has resolved.   Headache      Home Medications Prior to Admission medications   Medication Sig Start Date End Date Taking? Authorizing Provider  alendronate (FOSAMAX) 70 MG tablet Take 1 tablet (70 mg total) by mouth every 7 (seven) days. Take with a full glass of water on an empty stomach. Avoid laying flat for 2 hours. 01/09/22   [name], NP  atorvastatin (LIPITOR) 20 MG tablet Take 1 tablet (20 mg total) by mouth daily. For cholesterol. 12/30/21   [name], NP  irbesartan (AVAPRO) 75 MG tablet Take 1 tablet (75 mg total) by mouth daily. For blood pressure. Patient not taking: Reported on 12/30/2021 01/06/21   [name], NP  lansoprazole (PREVACID) 15 MG capsule Take 15 mg by mouth daily.  05/19/20   [provider]  Loratadine 10 MG CAPS  10/02/21   [provider]  metoprolol tartrate (LOPRESSOR) 25 MG tablet Take 0.5 tablets (12.5 mg total) by mouth 2 (two) times daily. for blood  pressure. 02/12/22   [name], NP  OVER THE COUNTER MEDICATION Take 15 mg by mouth at bedtime. CBD two tabs at bedtime.    [provider]  pregabalin (LYRICA) 75 MG capsule TAKE 1 CAPSULE BY MOUTH DAILY AS NEEDED. 04/03/22   [name], NP  Vitamin D, Ergocalciferol, (DRISDOL) 1.25 MG (50000 UNIT) CAPS capsule Take 1 capsule (50,000 Units total) by mouth once a week. 02/01/22   [name], NP      Allergies    Darvon [propoxyphene hcl]    Review of Systems   Review of Systems  Neurological:  Positive for headaches.    Physical Exam Updated Vital Signs BP (!) 169/82   Pulse 66   Temp 98.4 F (36.9 C) (Oral)   Resp 17   Ht '5\' 6"'$  (1.676 m)   Wt 56.7 kg   SpO2 98%   BMI 20.18 kg/m  Physical Exam Vitals and nursing note reviewed.  Constitutional:      General: She is not in acute distress.    Appearance: She is well-developed. She is not diaphoretic.  HENT:     Head: Normocephalic and atraumatic.  Eyes:     Pupils: Pupils are equal, round, and reactive to light.  Cardiovascular:     Rate and Rhythm: Normal rate and  regular rhythm.     Heart sounds: No murmur heard.    No friction rub. No gallop.  Pulmonary:     Effort: Pulmonary effort is normal.     Breath sounds: No wheezing or rales.  Abdominal:     General: There is no distension.     Palpations: Abdomen is soft.     Tenderness: There is no abdominal tenderness.  Musculoskeletal:        General: No tenderness.     Cervical back: Normal range of motion and neck supple.  Skin:    General: Skin is warm and dry.  Neurological:     Mental Status: She is alert and oriented to person, place, and time.  Psychiatric:        Behavior: Behavior normal.     ED Results / Procedures / Treatments   Labs (all labs ordered are listed, but only abnormal results are displayed) Labs Reviewed  CBC WITH DIFFERENTIAL/PLATELET - Abnormal; Notable for the following components:      Result Value    MCH 34.5 (*)    All other components within normal limits  COMPREHENSIVE METABOLIC PANEL - Abnormal; Notable for the following components:   Sodium 133 (*)    CO2 20 (*)    Glucose, Bld 119 (*)    AST 42 (*)    Total Bilirubin 1.7 (*)    All other components within normal limits    EKG None  Radiology CT Head Wo Contrast  Result Date: 04/20/2022 CLINICAL DATA:  Headache. EXAM: CT HEAD WITHOUT CONTRAST TECHNIQUE: Contiguous axial images were obtained from the base of the skull through the vertex without intravenous contrast. RADIATION DOSE REDUCTION: This exam was performed according to the departmental dose-optimization program which includes automated exposure control, adjustment of the mA and/or kV according to patient size and/or use of iterative reconstruction technique. COMPARISON:  None Available. FINDINGS: Brain: No evidence of acute infarction, hemorrhage, hydrocephalus, extra-axial collection or mass lesion/mass effect. Vascular: No hyperdense vessel or unexpected calcification. Skull: Normal. Negative for fracture or focal lesion. Sinuses/Orbits: Globes and orbits are unremarkable. Visualized sinuses are clear. Other: None. IMPRESSION: Negative unenhanced CT scan of the brain. Electronically Signed   By: Lajean Manes M.D.   On: 04/20/2022 08:50    Procedures Procedures    Medications Ordered in ED Medications  prochlorperazine (COMPAZINE) injection 10 mg (10 mg Intravenous Given 04/20/22 0831)  diphenhydrAMINE (BENADRYL) injection 25 mg (25 mg Intravenous Given 04/20/22 0827)  sodium chloride 0.9 % bolus 1,000 mL (0 mLs Intravenous Stopped 04/20/22 0954)  dexamethasone (DECADRON) tablet 10 mg (10 mg Oral Given 04/20/22 6269)    ED Course/ Medical Decision Making/ A&P                           Medical Decision Making Amount and/or Complexity of Data Reviewed Labs: ordered. Radiology: ordered.  Risk Prescription drug management.   61 yo F with a chief complaints of  headache.  This is left-sided been going on now for about 24 hours.  She had had headaches each day left-sided but were very mild.  She also had an episode of dizziness that lasted for about 24 hours that resolved a couple days ago.  She has a benign neurologic exam.  She is describing the worsening yesterday as acute in onset.  Seems less likely to be subarachnoid as the patient is improving without obvious intervention over 24 hours but will obtain  a CT scan.  We will treat with a headache cocktail.  Bolus of IV fluids for intractable nausea and vomiting and reassess.  Patient feeling much better on reassessments.  CT scan of the head was negative for acute intracranial hemorrhage is independently interpreted by me.  CBC without leukocytosis or anemia.  Trivial acidosis.  Patient like to go home at this time.  We will have her follow-up with her neurologist.  1:05 PM:  I have discussed the diagnosis/risks/treatment options with the patient and family.  Evaluation and diagnostic testing in the emergency department does not suggest an emergent condition requiring admission or immediate intervention beyond what has been performed at this time.  They will follow up with  Neuro. We also discussed returning to the ED immediately if new or worsening sx occur. We discussed the sx which are most concerning (e.g., sudden worsening pain, fever, inability to tolerate by mouth) that necessitate immediate return. Medications administered to the patient during their visit and any new prescriptions provided to the patient are listed below.  Medications given during this visit Medications  prochlorperazine (COMPAZINE) injection 10 mg (10 mg Intravenous Given 04/20/22 0831)  diphenhydrAMINE (BENADRYL) injection 25 mg (25 mg Intravenous Given 04/20/22 0827)  sodium chloride 0.9 % bolus 1,000 mL (0 mLs Intravenous Stopped 04/20/22 0954)  dexamethasone (DECADRON) tablet 10 mg (10 mg Oral Given 04/20/22 0949)     The  patient appears reasonably screen and/or stabilized for discharge and I doubt any other medical condition or other [facility] requiring further screening, evaluation, or treatment in the ED at this time prior to discharge.          Final Clinical Impression(s) / ED Diagnoses Final diagnoses:  Left-sided headache    Rx / DC Orders ED Discharge Orders     None         Deno Etienne, DO 04/20/22 1305

## 2022-04-20 NOTE — Discharge Instructions (Signed)
Call your neurologist and let them know that you had a headache that brought you to the emergency department.  Please return for worsening headache fever one-sided numbness or weakness or difficulty speech or swallowing.

## 2022-04-20 NOTE — ED Triage Notes (Signed)
Yesterday patient said she got a bad headache and got nauseous. Her BP was 160/100. She said it is the worst headache she has ever had. Pain is to her left temple.

## 2022-04-21 NOTE — Telephone Encounter (Signed)
Called patient reviewed all symptoms with her. She has been having. States that she is going to start taking her bp meds at night and see if that helps. She can not get out of work today. She will check readings and send me message. She is aware of all red words and will go to ED if any.

## 2022-05-02 ENCOUNTER — Other Ambulatory Visit: Payer: Self-pay | Admitting: Primary Care

## 2022-05-02 DIAGNOSIS — I10 Essential (primary) hypertension: Secondary | ICD-10-CM

## 2022-05-02 NOTE — Telephone Encounter (Signed)
Answering service call.  Ran out of Avapro yesterday.Recent ER visit for elevated BP - 04/20/22 , 169/82.  Seen in 12/30/21 by PCP. Resumed avapro 1/2 per day. Currently taking 1/2 pill BID.  Temporary 30-day refill provided until she can be seen in office to review medication regimen and ER follow up. Asked that patient call PCPs office Monday to schedule follow-up appointment

## 2022-05-04 ENCOUNTER — Telehealth: Payer: Self-pay | Admitting: Primary Care

## 2022-05-04 NOTE — Telephone Encounter (Signed)
Left message to return call to our office.  30 day supply sent in by Dr. Nyoka Cowden on 7/1. Patient was advised that she needs to make office visit with Anda Kraft in my chart message from [facility]. Please call patient to set up office visit.

## 2022-05-04 NOTE — Telephone Encounter (Signed)
Patient called access nurse over the weekend for refill of avapro.  She is having worsening symptoms. Please call patient and schedule appointment with PCP to access.

## 2022-05-04 NOTE — Telephone Encounter (Signed)
Left a vm to callback. 

## 2022-05-06 NOTE — Telephone Encounter (Signed)
Noted. We will try to accommodate if possible.

## 2022-05-06 NOTE — Telephone Encounter (Signed)
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 She is bringing patient that she is taking care of next Thursday 05/14/22 at 7:40 and didn't know if this can be addressed then as well. Please advise

## 2022-05-06 NOTE — Telephone Encounter (Signed)
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 She states that patient has appointment on 7/13 at 720. The only way she can come in is if we do it at that time. Advised patient that the only thing you have that day is later. I have added her at the open time advised that we may be able to see then but I can not guarantee. She states she will have to be seen during his appointment time or she will not be able to be seen at all. She just needs to check her blood pressure.

## 2022-05-14 ENCOUNTER — Ambulatory Visit (INDEPENDENT_AMBULATORY_CARE_PROVIDER_SITE_OTHER): Payer: 59 | Admitting: Primary Care

## 2022-05-14 ENCOUNTER — Encounter: Payer: Self-pay | Admitting: Primary Care

## 2022-05-14 VITALS — BP 118/62 | HR 77 | Temp 98.8°F | Ht 66.0 in | Wt 125.0 lb

## 2022-05-14 DIAGNOSIS — I10 Essential (primary) hypertension: Secondary | ICD-10-CM | POA: Diagnosis not present

## 2022-05-14 MED ORDER — AMLODIPINE BESYLATE 5 MG PO TABS: 5.0000 mg | ORAL_TABLET | Freq: Every day | ORAL | 0 refills | Status: DC

## 2022-05-14 NOTE — Assessment & Plan Note (Addendum)
Controlled today, however, she shows me very erratic BP readings.  Reviewed echocardiogram from 2021 and stress test from 2018.  Stop Irbesartan 75 mg, 1/2 tablet daily.  Start amlodipine 5 mg once daily.  She will send readings via MyChart in 2 weeks.

## 2022-05-14 NOTE — Progress Notes (Signed)
Subjective:    Patient ID: Amber Roberts, female    DOB: [birth year], 61 y.o.   MRN: [id number]  Hypertension Pertinent negatives include no chest pain, headaches or shortness of breath.    Amber Roberts is a very pleasant 61 y.o. female with history of CAD, aortic atherosclerosis, hypertension, tobacco abuse, hyperlipidemia, palpitations, chronic neck pain who presents today for follow-up of hypertension.  Currently managed on irbesartan 75 mg and is taking 1/2 tablet twice daily.  Previously, she was taking 1/2 tablet of irbesartan in the morning as a full tablet was causing lower blood pressure readings with dizziness.  In late June she updated [place] stating that her blood pressure readings were erratic ranging from 110's-160's/70's-90's.   Today she endorses BP readings continued to range anywhere from low 100's-160's/70's-90's with two low readings in the 74/46, 80/49 recently. Most of her readings are ranging 120's-140's/80's. When BP is elevated she "feels impending doom", headaches, fatigue.   She has been evaluated by cardiology several times, was told during her last visit not to return unless there was an issue. She has undergone a stress test in 2018, echocardiogram in 2021.   BP Readings from Last 3 Encounters:  05/14/22 118/62  04/20/22 (!) 169/82  12/30/21 136/78      Review of Systems  Respiratory:  Negative for shortness of breath.   Cardiovascular:  Negative for chest pain and leg swelling.       See HPI  Neurological:  Negative for headaches.         Past Medical History:  Diagnosis Date   Abnormal Pap smear of vagina    abnl paps of vaginal cuff; last pap 11/2003   Cat bite of left hand 06/28/2020   Cervical cancer ([place]) early 20's   Chest pain    Fibromyalgia    GERD (gastroesophageal reflux disease)    Hyperlipidemia    MI (myocardial infarction) ([place])    Osteopenia    Premature menopause 37-40   Seasonal allergies    now mostly yearlong    Seizure ([place]) 2010   per pt, related to lowered seizure threshold from imipramine and tramadol)--saw Dr. 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   Drug use: No   Sexual activity: Not Currently  Other Topics Concern   Not on file  Social History Narrative   Lives at home with 77 year old autistic son.  5 cats, 1 dog.     Social Determinants of Health   Financial Resource Strain: Not on file  Food Insecurity: Not on file  Transportation Needs: Not on file  Physical Activity: Not on file  Stress: Not on file  Social Connections: Not on  file  Intimate Partner Violence: Not on file    Past Surgical History:  Procedure Laterality Date   surgery for cervical cancer     VAGINAL HYSTERECTOMY     fibroids   WRIST SURGERY     deQuervains    Family History  Problem Relation Age of Onset   Scoliosis Mother    Cancer Father 20       testicular cancer   Autism Son    Cancer Maternal Uncle        esophageal (smoker)   Macular degeneration Maternal Grandmother        wet   Hypertension Maternal Grandmother    Hyperlipidemia Maternal Grandmother    Depression Maternal Grandmother        related to age, loss of vision   Colitis Maternal Grandmother    Breast cancer Maternal Grandmother    Breast cancer Paternal Grandfather    Diabetes Neg Hx     Allergies  Allergen  Reactions   Darvon [Propoxyphene Hcl] Nausea And Vomiting    Current Outpatient Medications on File Prior to Visit  Medication Sig Dispense Refill   lansoprazole (PREVACID) 15 MG capsule Take 15 mg by mouth daily.      Loratadine 10 MG CAPS      metoprolol tartrate (LOPRESSOR) 25 MG tablet Take 0.5 tablets (12.5 mg total) by mouth 2 (two) times daily. for blood pressure. 90 tablet 2   OVER THE COUNTER MEDICATION Take 15 mg by mouth at bedtime. CBD two tabs at bedtime.     pregabalin (LYRICA) 75 MG capsule TAKE 1 CAPSULE BY MOUTH DAILY AS NEEDED. 30 capsule 0   Vitamin D, Ergocalciferol, (DRISDOL) 1.25 MG (50000 UNIT) CAPS capsule Take 1 capsule (50,000 Units total) by mouth once a week. 12 capsule 1   alendronate (FOSAMAX) 70 MG tablet Take 1 tablet (70 mg total) by mouth every 7 (seven) days. Take with a full glass of water on an empty stomach. Avoid laying flat for 2 hours. (Patient not taking: Reported on 05/14/2022) 12 tablet 3   atorvastatin (LIPITOR) 20 MG tablet Take 1 tablet (20 mg total) by mouth daily. For cholesterol. (Patient not taking: Reported on 05/14/2022) 90 tablet 0   No current facility-administered medications on file prior to visit.    BP 118/62   Pulse 77   Temp 98.8 F (37.1 C) (Oral)   Ht '5\' 6"'$  (1.676 m)   Wt 125 lb (56.7 kg)   SpO2 98%   BMI 20.18 kg/m  Objective:   Physical Exam Cardiovascular:     Rate and Rhythm: Normal rate and regular rhythm.  Pulmonary:     Effort: Pulmonary effort is normal.     Breath sounds: Normal breath sounds.  Musculoskeletal:     Cervical back: Neck supple.  Skin:    General: Skin is warm and dry.           Assessment & Plan:   Problem List Items Addressed This Visit       Cardiovascular and Mediastinum   Essential hypertension - Primary    Controlled today, however, she shows me very erratic BP readings.  Reviewed echocardiogram from 2021 and stress test from 2018.  Stop Irbesartan 75 mg, 1/2 tablet  daily.  Start amlodipine 5 mg once daily.  She will send readings via MyChart in 2 weeks.      Relevant Medications   amLODipine (NORVASC) 5 MG tablet       [name],  NP

## 2022-05-14 NOTE — Patient Instructions (Signed)
Stop Irbesartan for blood pressure.  Start amlodipine 5 mg once daily for blood pressure.   Please update me via MyChart in 2 weeks.   It was a pleasure to see you today!

## 2022-05-26 ENCOUNTER — Other Ambulatory Visit: Payer: Self-pay | Admitting: Primary Care

## 2022-05-26 DIAGNOSIS — M792 Neuralgia and neuritis, unspecified: Secondary | ICD-10-CM

## 2022-05-26 MED ORDER — PREGABALIN 75 MG PO CAPS: 75.0000 mg | ORAL_CAPSULE | Freq: Every day | ORAL | 0 refills | Status: DC | PRN

## 2022-05-29 ENCOUNTER — Other Ambulatory Visit: Payer: Self-pay | Admitting: Family Medicine

## 2022-05-29 DIAGNOSIS — I10 Essential (primary) hypertension: Secondary | ICD-10-CM

## 2022-07-17 ENCOUNTER — Other Ambulatory Visit: Payer: Self-pay | Admitting: Primary Care

## 2022-07-17 DIAGNOSIS — M792 Neuralgia and neuritis, unspecified: Secondary | ICD-10-CM

## 2022-07-17 MED ORDER — PREGABALIN 75 MG PO CAPS: 75.0000 mg | ORAL_CAPSULE | Freq: Every day | ORAL | 0 refills | Status: DC | PRN

## 2022-07-21 ENCOUNTER — Other Ambulatory Visit: Payer: Self-pay | Admitting: Primary Care

## 2022-07-21 DIAGNOSIS — E559 Vitamin D deficiency, unspecified: Secondary | ICD-10-CM

## 2022-08-12 ENCOUNTER — Other Ambulatory Visit: Payer: Self-pay | Admitting: Primary Care

## 2022-08-12 DIAGNOSIS — I10 Essential (primary) hypertension: Secondary | ICD-10-CM

## 2022-09-14 ENCOUNTER — Other Ambulatory Visit: Payer: Self-pay | Admitting: Primary Care

## 2022-09-14 DIAGNOSIS — E7800 Pure hypercholesterolemia, unspecified: Secondary | ICD-10-CM

## 2022-09-14 DIAGNOSIS — I2510 Atherosclerotic heart disease of native coronary artery without angina pectoris: Secondary | ICD-10-CM

## 2022-09-14 MED ORDER — ATORVASTATIN CALCIUM 20 MG PO TABS: 20.0000 mg | ORAL_TABLET | Freq: Every day | ORAL | 0 refills | Status: DC

## 2022-09-14 NOTE — Telephone Encounter (Signed)
From: Greig Right To: Office of [name], NP Sent: 09/11/2022 4:41 PM EST Subject: Medication Renewal Request  Refills have been requested for the following medications:   atorvastatin (LIPITOR) 20 MG tablet [Shantele Reller K Katalaya Beel]  Patient Comment: [facility]s [facility] [address]   Preferred pharmacy: [place], [place] - [place] [address] Delivery method: [employer]

## 2022-09-28 ENCOUNTER — Telehealth: Payer: Self-pay | Admitting: Primary Care

## 2022-09-28 DIAGNOSIS — E559 Vitamin D deficiency, unspecified: Secondary | ICD-10-CM

## 2022-09-28 DIAGNOSIS — M792 Neuralgia and neuritis, unspecified: Secondary | ICD-10-CM

## 2022-09-28 DIAGNOSIS — I2510 Atherosclerotic heart disease of native coronary artery without angina pectoris: Secondary | ICD-10-CM

## 2022-09-28 DIAGNOSIS — E7800 Pure hypercholesterolemia, unspecified: Secondary | ICD-10-CM

## 2022-09-28 NOTE — Telephone Encounter (Signed)
Patient is due for her annual follow up in late February 2024. Please schedule. She will need this visit for refills after that date.

## 2022-09-29 DIAGNOSIS — E7800 Pure hypercholesterolemia, unspecified: Secondary | ICD-10-CM

## 2022-09-29 DIAGNOSIS — I2510 Atherosclerotic heart disease of native coronary artery without angina pectoris: Secondary | ICD-10-CM

## 2022-09-29 NOTE — Telephone Encounter (Signed)
Lvmctb, sent mcyhart message

## 2022-09-29 NOTE — Telephone Encounter (Signed)
Patient scheduled.

## 2022-10-01 MED ORDER — ATORVASTATIN CALCIUM 20 MG PO TABS: 20.0000 mg | ORAL_TABLET | Freq: Every day | ORAL | 0 refills | Status: DC

## 2022-10-01 NOTE — Addendum Note (Signed)
Addended by: Kris Mouton on: 10/01/2022 08:35 AM   Modules accepted: Orders

## 2022-10-16 ENCOUNTER — Encounter: Payer: Self-pay | Admitting: Cardiology

## 2022-10-16 NOTE — Telephone Encounter (Signed)
Error

## 2022-10-20 ENCOUNTER — Encounter: Payer: Self-pay | Admitting: Physician Assistant

## 2022-10-20 ENCOUNTER — Ambulatory Visit: Payer: 59 | Attending: Physician Assistant | Admitting: Cardiology

## 2022-10-20 VITALS — BP 100/60 | HR 81 | Ht 65.0 in | Wt 127.8 lb

## 2022-10-20 DIAGNOSIS — E781 Pure hyperglyceridemia: Secondary | ICD-10-CM

## 2022-10-20 DIAGNOSIS — I219 Acute myocardial infarction, unspecified: Secondary | ICD-10-CM | POA: Insufficient documentation

## 2022-10-20 DIAGNOSIS — E785 Hyperlipidemia, unspecified: Secondary | ICD-10-CM | POA: Diagnosis not present

## 2022-10-20 DIAGNOSIS — R002 Palpitations: Secondary | ICD-10-CM

## 2022-10-20 DIAGNOSIS — R635 Abnormal weight gain: Secondary | ICD-10-CM

## 2022-10-20 DIAGNOSIS — I10 Essential (primary) hypertension: Secondary | ICD-10-CM

## 2022-10-20 DIAGNOSIS — I2510 Atherosclerotic heart disease of native coronary artery without angina pectoris: Secondary | ICD-10-CM

## 2022-10-20 NOTE — Patient Instructions (Signed)
Medication Instructions:  Your physician recommends that you continue on your current medications as directed. Please refer to the Current Medication list given to you today.  *If you need a refill on your cardiac medications before your next appointment, please call your pharmacy*   Lab Work: FRIDAY, 12/22:  COME ANYTIME AFTER 7:15 AM, FASTING (nothing after midnight the night before) FOR:  CMET, LIPID, TSH, & VIT D  If you have labs (blood work) drawn today and your tests are completely normal, you will receive your results only by: [place] (if you have MyChart) OR A paper copy in the mail If you have any lab test that is abnormal or we need to change your treatment, we will call you to review the results.   Testing/Procedures: None ordered   Follow-Up: At [facility], you and your health needs are our priority.  As part of our continuing mission to provide you with exceptional heart care, we have created designated Provider Care Teams.  These Care Teams include your primary Cardiologist (physician) and Advanced Practice Providers (APPs -  Physician Assistants and Nurse Practitioners) who all work together to provide you with the care you need, when you need it.  We recommend signing up for the patient portal called "MyChart".  Sign up information is provided on this After Visit Summary.  MyChart is used to connect with patients for Virtual Visits (Telemedicine).  Patients are able to view lab/test results, encounter notes, upcoming appointments, etc.  Non-urgent messages can be sent to your provider as well.   To learn more about what you can do with MyChart, go to [email or web address].    Your next appointment:   1 year(s)  The format for your next appointment:   In Person  Provider:   Evalina Field, MD     Other Instructions   Important Information About Sugar

## 2022-10-20 NOTE — Progress Notes (Signed)
Cardiology Office Note:    Date:  10/20/2022   ID:  Amber Roberts, DOB 05-11-1961, MRN [id number]  PCP:  [name], NP   [facility] Providers Cardiologist:  Evalina Field, MD     Referring MD: [name], NP   No chief complaint on file.   History of Present Illness:    Amber Roberts is a 61 y.o. female with a hx of HTN, HLD, GERD, fibromyalgia, previous MI (reports years ago, diagnosed via echo).  She presents today for a follow up for her HTN. She denies chest pain, palpitations, dyspnea, pnd, orthopnea, n, v, dizziness, syncope, weight gain, or early satiety. She recently ate at a [place] and noted her ankles were swollen the next day. This was concerning for her and she was worried she could be developing heart failure. She denies any SOB, CP, palpitations on this day. She states her BP has been very well managed since her PCP stopped her irbesartan and put her on Norvasc in July. She checks is sporadically at home a usually ranges 110's/60-70's. States she has gained ~ 25 over the last year and this is bothersome to her. 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   She requests that we check her Vitamin D level today as well.    Past Medical History:  Diagnosis Date   Abnormal Pap smear of vagina    abnl paps of vaginal cuff; last pap 11/2003   Cat bite of left hand 06/28/2020   Cervical cancer ([place]) early 20's   Chest pain    Fibromyalgia    GERD (gastroesophageal reflux disease)    Hyperlipidemia    MI (myocardial infarction) ([place])    Osteopenia    Premature menopause 39-38   Seasonal allergies    now mostly yearlong   Seizure ([place]) 2010   per pt, related to lowered seizure threshold from imipramine and tramadol)--saw Dr. Jannifer Franklin   Smoker     Past Surgical History:  Procedure Laterality Date   surgery for cervical cancer     VAGINAL HYSTERECTOMY      fibroids   WRIST SURGERY     deQuervains    Current Medications: Current Meds  Medication Sig   alendronate (FOSAMAX) 70 MG tablet Take 1 tablet (70 mg total) by mouth every 7 (seven) days. Take with a full glass of water on an empty stomach. Avoid laying flat for 2 hours.   amLODipine (NORVASC) 5 MG tablet TAKE 1 TABLET BY MOUTH EVERY DAY FOR BLOOD PRESSURE   atorvastatin (LIPITOR) 20 MG tablet Take 1 tablet (20 mg total) by mouth daily. For cholesterol.   lansoprazole (PREVACID) 15 MG capsule Take 15 mg by mouth daily.    metoprolol tartrate (LOPRESSOR) 25 MG tablet Take 0.5 tablets (12.5 mg total) by mouth 2 (two) times daily. for blood pressure.   OVER THE COUNTER MEDICATION Take 15 mg by mouth at bedtime. CBD two tabs at bedtime.   pregabalin (LYRICA) 75 MG capsule TAKE 1 CAPSULE (75 MG TOTAL) BY MOUTH DAILY AS NEEDED.     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   Drug use: No   Sexual activity: Not Currently  Other Topics Concern   Not on file  Social History Narrative   Lives at home with 59 year old autistic son.  5 cats, 1 dog.     Social Determinants of Health   Financial Resource Strain: Not on file  Food Insecurity: Not on file  Transportation Needs: Not on file  Physical Activity: Not on file  Stress: Not on file  Social Connections: Not  on file     Family History: The patient's family history includes Autism in her son; Breast cancer in her maternal grandmother and paternal grandfather; Cancer in her maternal uncle; Cancer (age of onset: 48) in her father; Colitis in her maternal grandmother; Depression in her maternal grandmother; Hyperlipidemia in her maternal grandmother; Hypertension in her maternal grandmother; Macular degeneration in her maternal grandmother; Scoliosis in her mother. There is no history of Diabetes.  ROS:   Review of Systems  Constitutional: Negative.   HENT: Negative.    Eyes: Negative.   Respiratory: Negative.    Cardiovascular:  Positive for palpitations (occasionally, but much improved) and leg swelling (one recent episode). Negative for chest pain, orthopnea and PND.  Gastrointestinal: Negative.   Genitourinary: Negative.   Musculoskeletal: Negative.   Skin: Negative.   Neurological:  Negative for dizziness, loss of consciousness and headaches.  Endo/Heme/Allergies: Negative.   Psychiatric/Behavioral: Negative.       EKGs/Labs/Other Studies Reviewed:    The following studies were reviewed today:  07/17/20 Echo complete - LVEF 60-65%, elevated LA pressure  07/15/20 CT angio chest PE - no PE, No definite coronary artery calcifications.   EKG:  EKG is  ordered today.  The ekg ordered today demonstrates NSR, 81 bpm, serial EKGs show anteroseptal infarct, NSR, dating back to 2018.   Recent Labs: 04/20/2022: ALT 27; BUN 11; Creatinine, Ser 0.73; Hemoglobin 14.0; Platelets 172; Potassium 3.5; Sodium 133  Recent Lipid Panel    Component Value Date/Time   CHOL 320 (H) 12/30/2021 1311   TRIG (H) 12/30/2021 1311    1240.0 Triglyceride is over 400; calculations on Lipids are invalid.   HDL 54.50 12/30/2021 1311   CHOLHDL 6 12/30/2021 1311   VLDL 17 07/17/2020 0435   LDLCALC 100 (H) 07/17/2020 0435   LDLDIRECT 136.0 12/30/2021 1311     Risk Assessment/Calculations:                 Physical Exam:    VS:  BP 100/60   Pulse 81   Ht _0  (1.651 m)   Wt 127 lb 12.8 oz (58 kg)   SpO2 98%   BMI 21.27 kg/m     Wt Readings from Last 3 Encounters:  10/20/22 127 lb 12.8 oz (58 kg)  05/14/22 125 lb (56.7 kg)  04/20/22 125 lb (56.7 kg)     GEN:  Well nourished, well developed in no acute distress HEENT: Normal NECK: No JVD;  No carotid bruits LYMPHATICS: No lymphadenopathy CARDIAC: RRR, no murmurs, rubs, gallops RESPIRATORY:  Clear to auscultation without rales, wheezing or rhonchi  ABDOMEN: Soft, non-tender, non-distended MUSCULOSKELETAL:  No edema; No deformity  SKIN: Warm and dry NEUROLOGIC:  Alert and oriented x 3 PSYCHIATRIC:  Normal affect   ASSESSMENT:    1. Essential hypertension   2. Coronary artery disease involving native coronary artery without angina pectoris, unspecified whether native or transplanted heart   3. Palpitations   4. Hyperlipidemia, unspecified hyperlipidemia type   5. Hypertriglyceridemia   6. Weight gain    PLAN:    In order of problems listed above:  Essential HTN - BP is well controlled @ 100/60. She denies any dizziness, presyncope, or syncope. She states that her BP today is lower than her readings at home, which are typically 110/60-70's. Continue Norvasc 5 mg daily, continue metoprolol tartrate 12.5 mg twice daily. CAD - No angina or acute decompensation. Reports that she had an MI "years ago" in another region. She did not have a cath, said it was diagnosed on an echo during an extremely stressful time of her life. EKGs since show NSR with anterolateral infarct (EKGs in system back to 2013 are similar).  Palpitations - Less frequent than they have been in the past. When they do occur, she does not have any dizziness, SOB associated with them. Continue to avoid triggers (ETOH, caffeine). Continue metoprolol tartrate 12.5 mg twice daily.  HLD/hypertriglyceridemia - On 12/30/21, LDL 136, Triglycerides 1240. Was started on  atorvastatin 20 mg at that time. She has a hx of pancreatitis in the past. She was unaware that it was elevated to this range in February. Will arrange for fasting lipids and CMET.  Weight gain - Reports ~ 25 lbs over the last year, she is concerned as to why. Feels partially contributed by her inactivity at work. Will check TSH.            Medication Adjustments/Labs and Tests Ordered: Current medicines are reviewed at length with the patient today.  Concerns regarding medicines are outlined above.  Orders Placed This Encounter  Procedures   Comp Met (CMET)   Lipid panel   TSH   Vitamin D (25 hydroxy)   EKG 12-Lead   No orders of the defined types were placed in this encounter.   Patient Instructions  Medication Instructions:  Your physician recommends that you continue on your current medications as directed. Please refer to the Current Medication list given to you today.  *If you need a refill on your cardiac medications before your next appointment, please call your pharmacy*   Lab Work: FRIDAY, 12/22:  COME ANYTIME AFTER 7:15 AM, FASTING (nothing after midnight the night before) FOR:  CMET, LIPID, TSH, & VIT D  If you have labs (blood work) drawn today and your tests are completely normal, you will receive your results only by: [place] (if you have MyChart) OR A paper copy in the mail If you have any lab test that is abnormal or we need to change your treatment, we will call you to review the results.   Testing/Procedures: None ordered   Follow-Up: At [facility], you and your health needs are our priority.  As part of our continuing mission to provide you with exceptional heart care, we have created designated Provider Care Teams.  These Care Teams include your primary Cardiologist (physician) and Advanced Practice Providers (APPs -  Physician Assistants and Nurse Practitioners) who all work  together to provide you with the care you need, when you  need it.  We recommend signing up for the patient portal called "MyChart".  Sign up information is provided on this After Visit Summary.  MyChart is used to connect with patients for Virtual Visits (Telemedicine).  Patients are able to view lab/test results, encounter notes, upcoming appointments, etc.  Non-urgent messages can be sent to your provider as well.   To learn more about what you can do with MyChart, go to [email or web address].    Your next appointment:   1 year(s)  The format for your next appointment:   In Person  Provider:   Evalina Field, MD     Other Instructions   Important Information About Sugar         Signed, Trudi Ida, NP  10/20/2022 4:17 PM    [place]

## 2022-10-23 ENCOUNTER — Other Ambulatory Visit: Payer: 59

## 2022-10-29 ENCOUNTER — Ambulatory Visit: Payer: 59 | Attending: Cardiology

## 2022-10-29 DIAGNOSIS — R002 Palpitations: Secondary | ICD-10-CM

## 2022-10-29 DIAGNOSIS — I10 Essential (primary) hypertension: Secondary | ICD-10-CM

## 2022-10-29 DIAGNOSIS — I2510 Atherosclerotic heart disease of native coronary artery without angina pectoris: Secondary | ICD-10-CM

## 2022-10-30 LAB — COMPREHENSIVE METABOLIC PANEL WITH GFR
ALT: 19 IU/L (ref 0–32)
AST: 31 IU/L (ref 0–40)
Albumin/Globulin Ratio: 2.2 (ref 1.2–2.2)
Albumin: 5 g/dL — ABNORMAL HIGH (ref 3.8–4.9)
Alkaline Phosphatase: 123 IU/L — ABNORMAL HIGH (ref 44–121)
BUN/Creatinine Ratio: 13 (ref 9–23)
BUN: 9 mg/dL (ref 6–24)
Bilirubin Total: 0.7 mg/dL (ref 0.0–1.2)
CO2: 22 mmol/L (ref 20–29)
Calcium: 10.1 mg/dL (ref 8.7–10.2)
Chloride: 100 mmol/L (ref 96–106)
Creatinine, Ser: 0.71 mg/dL (ref 0.57–1.00)
Globulin, Total: 2.3 g/dL (ref 1.5–4.5)
Glucose: 116 mg/dL — ABNORMAL HIGH (ref 70–99)
Potassium: 4.2 mmol/L (ref 3.5–5.2)
Sodium: 140 mmol/L (ref 134–144)
Total Protein: 7.3 g/dL (ref 6.0–8.5)
eGFR: 98 mL/min/1.73

## 2022-10-30 LAB — LIPID PANEL
Chol/HDL Ratio: 2.8 ratio (ref 0.0–4.4)
Cholesterol, Total: 217 mg/dL — ABNORMAL HIGH (ref 100–199)
HDL: 77 mg/dL
LDL Chol Calc (NIH): 106 mg/dL — ABNORMAL HIGH (ref 0–99)
Triglycerides: 199 mg/dL — ABNORMAL HIGH (ref 0–149)
VLDL Cholesterol Cal: 34 mg/dL (ref 5–40)

## 2022-10-30 LAB — VITAMIN D 25 HYDROXY (VIT D DEFICIENCY, FRACTURES): Vit D, 25-Hydroxy: 42 ng/mL (ref 30.0–100.0)

## 2022-10-30 LAB — TSH: TSH: 1.26 u[IU]/mL (ref 0.450–4.500)

## 2022-11-16 ENCOUNTER — Other Ambulatory Visit: Payer: Self-pay | Admitting: Primary Care

## 2022-11-16 DIAGNOSIS — M792 Neuralgia and neuritis, unspecified: Secondary | ICD-10-CM

## 2022-11-16 MED ORDER — PREGABALIN 75 MG PO CAPS: 75.0000 mg | ORAL_CAPSULE | Freq: Every day | ORAL | 0 refills | Status: DC | PRN

## 2022-11-16 NOTE — Telephone Encounter (Signed)
From: Greig Right To: Office of [name], NP Sent: 11/13/2022 6:24 PM EST Subject: Medication Renewal Request  Refills have been requested for the following medications:   pregabalin (LYRICA) 75 MG capsule [Yarisa Lynam K Marlet Korte]  [id number] [place] [address] [place]: [employer]

## 2022-11-24 DIAGNOSIS — M810 Age-related osteoporosis without current pathological fracture: Secondary | ICD-10-CM

## 2022-12-16 ENCOUNTER — Encounter: Payer: 59 | Admitting: Primary Care

## 2022-12-17 ENCOUNTER — Other Ambulatory Visit: Payer: Self-pay | Admitting: Primary Care

## 2022-12-17 DIAGNOSIS — M792 Neuralgia and neuritis, unspecified: Secondary | ICD-10-CM

## 2022-12-17 DIAGNOSIS — E559 Vitamin D deficiency, unspecified: Secondary | ICD-10-CM

## 2022-12-17 MED ORDER — PREGABALIN 75 MG PO CAPS: 75.0000 mg | ORAL_CAPSULE | Freq: Two times a day (BID) | ORAL | 0 refills | Status: DC | PRN

## 2023-02-01 ENCOUNTER — Other Ambulatory Visit: Payer: Self-pay | Admitting: Primary Care

## 2023-02-01 DIAGNOSIS — M792 Neuralgia and neuritis, unspecified: Secondary | ICD-10-CM

## 2023-02-01 DIAGNOSIS — E559 Vitamin D deficiency, unspecified: Secondary | ICD-10-CM

## 2023-02-01 MED ORDER — VITAMIN D (ERGOCALCIFEROL) 1.25 MG (50000 UNIT) PO CAPS: ORAL_CAPSULE | ORAL | 0 refills | Status: DC

## 2023-02-01 MED ORDER — PREGABALIN 75 MG PO CAPS: 75.0000 mg | ORAL_CAPSULE | Freq: Two times a day (BID) | ORAL | 0 refills | Status: DC | PRN

## 2023-02-01 NOTE — Telephone Encounter (Signed)
From: Greig Right To: Office of [name], NP Sent: 01/31/2023 5:44 PM EDT Subject: Medication Renewal Request  Refills have been requested for the following medications:   pregabalin (LYRICA) 75 MG capsule [Zarria Towell K Samani Deal]  Patient Comment: I need Vit D also but not on list  [id number] [place], [place] AT [place] Delivery method: [employer]

## 2023-02-02 DIAGNOSIS — Z1211 Encounter for screening for malignant neoplasm of colon: Secondary | ICD-10-CM

## 2023-02-07 ENCOUNTER — Other Ambulatory Visit: Payer: Self-pay | Admitting: Primary Care

## 2023-02-07 DIAGNOSIS — I10 Essential (primary) hypertension: Secondary | ICD-10-CM

## 2023-02-23 ENCOUNTER — Other Ambulatory Visit: Payer: Self-pay | Admitting: Primary Care

## 2023-02-23 DIAGNOSIS — I10 Essential (primary) hypertension: Secondary | ICD-10-CM

## 2023-03-02 ENCOUNTER — Other Ambulatory Visit (HOSPITAL_BASED_OUTPATIENT_CLINIC_OR_DEPARTMENT_OTHER): Payer: Self-pay | Admitting: Primary Care

## 2023-03-02 DIAGNOSIS — J309 Allergic rhinitis, unspecified: Secondary | ICD-10-CM

## 2023-03-02 DIAGNOSIS — M810 Age-related osteoporosis without current pathological fracture: Secondary | ICD-10-CM

## 2023-03-02 DIAGNOSIS — E2839 Other primary ovarian failure: Secondary | ICD-10-CM

## 2023-03-02 DIAGNOSIS — Z1231 Encounter for screening mammogram for malignant neoplasm of breast: Secondary | ICD-10-CM

## 2023-03-02 MED ORDER — AZELASTINE HCL 0.1 % NA SOLN: 1.0000 | Freq: Two times a day (BID) | NASAL | 0 refills | Status: DC | PRN

## 2023-03-14 ENCOUNTER — Other Ambulatory Visit: Payer: Self-pay | Admitting: Primary Care

## 2023-03-14 DIAGNOSIS — E7800 Pure hypercholesterolemia, unspecified: Secondary | ICD-10-CM

## 2023-03-14 DIAGNOSIS — I2510 Atherosclerotic heart disease of native coronary artery without angina pectoris: Secondary | ICD-10-CM

## 2023-03-16 ENCOUNTER — Other Ambulatory Visit: Payer: Self-pay | Admitting: Primary Care

## 2023-03-16 DIAGNOSIS — E559 Vitamin D deficiency, unspecified: Secondary | ICD-10-CM

## 2023-03-16 DIAGNOSIS — M792 Neuralgia and neuritis, unspecified: Secondary | ICD-10-CM

## 2023-03-18 ENCOUNTER — Encounter (HOSPITAL_BASED_OUTPATIENT_CLINIC_OR_DEPARTMENT_OTHER): Payer: Self-pay | Admitting: Radiology

## 2023-03-18 ENCOUNTER — Ambulatory Visit (HOSPITAL_BASED_OUTPATIENT_CLINIC_OR_DEPARTMENT_OTHER)
Admission: RE | Admit: 2023-03-18 | Discharge: 2023-03-18 | Disposition: A | Discharge status: Home / Self Care | Payer: 59 | Source: Ambulatory Visit | Attending: Primary Care | Admitting: Primary Care

## 2023-03-18 DIAGNOSIS — Z1231 Encounter for screening mammogram for malignant neoplasm of breast: Secondary | ICD-10-CM | POA: Insufficient documentation

## 2023-03-18 DIAGNOSIS — M810 Age-related osteoporosis without current pathological fracture: Secondary | ICD-10-CM | POA: Insufficient documentation

## 2023-03-19 ENCOUNTER — Other Ambulatory Visit: Payer: Self-pay | Admitting: Primary Care

## 2023-03-19 DIAGNOSIS — M792 Neuralgia and neuritis, unspecified: Secondary | ICD-10-CM

## 2023-03-19 MED ORDER — PREGABALIN 75 MG PO CAPS: 75.0000 mg | ORAL_CAPSULE | Freq: Two times a day (BID) | ORAL | 0 refills | Status: DC | PRN

## 2023-03-19 NOTE — Telephone Encounter (Signed)
From: Gabriel Cirri To: Office of Doreene Nest, NP Sent: 03/19/2023 8:08 AM EDT Subject: Medication Renewal Request  Refills have been requested for the following medications:   pregabalin (LYRICA) 75 MG capsule [Lemond Griffee K Larosa Rhines]  Preferred pharmacy: [id number] [place],  - [address] AT [facility] [address] [facility] [address] Delivery method: [employer]ll Drown

## 2023-04-01 ENCOUNTER — Other Ambulatory Visit: Payer: Self-pay | Admitting: Primary Care

## 2023-04-01 DIAGNOSIS — J309 Allergic rhinitis, unspecified: Secondary | ICD-10-CM

## 2023-04-22 ENCOUNTER — Other Ambulatory Visit: Payer: Self-pay | Admitting: Primary Care

## 2023-04-22 DIAGNOSIS — M792 Neuralgia and neuritis, unspecified: Secondary | ICD-10-CM

## 2023-04-22 DIAGNOSIS — J309 Allergic rhinitis, unspecified: Secondary | ICD-10-CM

## 2023-05-03 ENCOUNTER — Other Ambulatory Visit: Payer: Self-pay | Admitting: Primary Care

## 2023-05-03 DIAGNOSIS — E559 Vitamin D deficiency, unspecified: Secondary | ICD-10-CM

## 2023-05-03 MED ORDER — VITAMIN D (ERGOCALCIFEROL) 1.25 MG (50000 UNIT) PO CAPS: ORAL_CAPSULE | ORAL | 0 refills | Status: DC

## 2023-05-03 NOTE — Telephone Encounter (Signed)
From: Gabriel Cirri To: Office of Doreene Nest, NP Sent: 04/30/2023 5:44 PM EDT Subject: Medication Renewal Request  Refills have been requested for the following medications:   Vitamin D, Ergocalciferol, (DRISDOL) 1.25 MG (50000 UNIT) CAPS capsule [Nyeema Want K Kaysen Deal]  Preferred [facility]: [id number] [place], [place] - [address] AT [facility] OF [facility] [address] Delivery method: [employer]ll Drown

## 2023-05-13 ENCOUNTER — Other Ambulatory Visit: Payer: Self-pay | Admitting: Primary Care

## 2023-05-13 DIAGNOSIS — I2510 Atherosclerotic heart disease of native coronary artery without angina pectoris: Secondary | ICD-10-CM

## 2023-05-13 DIAGNOSIS — E7800 Pure hypercholesterolemia, unspecified: Secondary | ICD-10-CM

## 2023-05-13 DIAGNOSIS — I10 Essential (primary) hypertension: Secondary | ICD-10-CM

## 2023-05-14 MED ORDER — PREGABALIN 75 MG PO CAPS
75.0000 mg | ORAL_CAPSULE | Freq: Two times a day (BID) | ORAL | 0 refills | Status: DC | PRN
Start: 2023-05-14 — End: 2023-06-26

## 2023-05-19 ENCOUNTER — Encounter: Payer: 59 | Admitting: Primary Care

## 2023-06-08 ENCOUNTER — Encounter: Payer: 59 | Admitting: Primary Care

## 2023-06-09 ENCOUNTER — Ambulatory Visit (INDEPENDENT_AMBULATORY_CARE_PROVIDER_SITE_OTHER): Payer: 59 | Admitting: Primary Care

## 2023-06-09 ENCOUNTER — Ambulatory Visit
Admission: RE | Admit: 2023-06-09 | Discharge: 2023-06-09 | Disposition: A | Discharge status: Home / Self Care | Payer: 59 | Source: Ambulatory Visit | Attending: Primary Care | Admitting: Primary Care

## 2023-06-09 ENCOUNTER — Encounter: Payer: Self-pay | Admitting: Primary Care

## 2023-06-09 VITALS — BP 104/58 | HR 80 | Temp 97.6°F | Ht 65.0 in | Wt 121.0 lb

## 2023-06-09 DIAGNOSIS — Z0001 Encounter for general adult medical examination with abnormal findings: Secondary | ICD-10-CM | POA: Diagnosis not present

## 2023-06-09 DIAGNOSIS — M810 Age-related osteoporosis without current pathological fracture: Secondary | ICD-10-CM | POA: Diagnosis not present

## 2023-06-09 DIAGNOSIS — F17200 Nicotine dependence, unspecified, uncomplicated: Secondary | ICD-10-CM

## 2023-06-09 DIAGNOSIS — E559 Vitamin D deficiency, unspecified: Secondary | ICD-10-CM

## 2023-06-09 DIAGNOSIS — I10 Essential (primary) hypertension: Secondary | ICD-10-CM

## 2023-06-09 DIAGNOSIS — I700 Atherosclerosis of aorta: Secondary | ICD-10-CM | POA: Diagnosis not present

## 2023-06-09 DIAGNOSIS — R002 Palpitations: Secondary | ICD-10-CM

## 2023-06-09 DIAGNOSIS — N6342 Unspecified lump in left breast, subareolar: Secondary | ICD-10-CM | POA: Diagnosis not present

## 2023-06-09 DIAGNOSIS — M546 Pain in thoracic spine: Secondary | ICD-10-CM

## 2023-06-09 DIAGNOSIS — I2510 Atherosclerotic heart disease of native coronary artery without angina pectoris: Secondary | ICD-10-CM

## 2023-06-09 DIAGNOSIS — G8929 Other chronic pain: Secondary | ICD-10-CM

## 2023-06-09 DIAGNOSIS — K219 Gastro-esophageal reflux disease without esophagitis: Secondary | ICD-10-CM

## 2023-06-09 DIAGNOSIS — M542 Cervicalgia: Secondary | ICD-10-CM

## 2023-06-09 DIAGNOSIS — E7800 Pure hypercholesterolemia, unspecified: Secondary | ICD-10-CM

## 2023-06-09 NOTE — Assessment & Plan Note (Signed)
Reviewed lipid panel from December 2023. Continue atorvastatin 20 mg daily.

## 2023-06-09 NOTE — Assessment & Plan Note (Signed)
Declines Shingrix and tetanus vaccine.  Mammogram UTD, but will place diagnostic ultrasound.  Colonoscopy over due, she will complete cologuard.  Discussed the importance of a healthy diet and regular exercise in order for weight loss, and to reduce the risk of further co-morbidity.  Exam stable. Labs pending.  Follow up in 1 year for repeat physical.

## 2023-06-09 NOTE — Assessment & Plan Note (Signed)
Controlled.  Continue Prevacid 15 mg daily.

## 2023-06-09 NOTE — Assessment & Plan Note (Signed)
Declines lung cancer screening program due to cost.

## 2023-06-09 NOTE — Assessment & Plan Note (Signed)
Asymptomatic.  Following with cardiology, office notes and labs reviewed from December 2023. Continue statin therapy, BP control.

## 2023-06-09 NOTE — Assessment & Plan Note (Signed)
Reviewed vitamin D from December 2023. Continue vitamin D 50,000 international units weekly.

## 2023-06-09 NOTE — Assessment & Plan Note (Signed)
Continue atorvastatin 20 mg daily. Repeat lipid panel reviewed from December 2023.

## 2023-06-09 NOTE — Assessment & Plan Note (Signed)
Reviewed bone density scan from May 2024.  Intolerant to Fosamax due to GI side effects. She opts for Prolia. Will get her enrolled.

## 2023-06-09 NOTE — Patient Instructions (Addendum)
Reduce your amlodipine pill to 2.5 mg daily for blood pressure.  You will be contacted via phone regarding your mammogram.  Complete your labs later as discussed.  Complete the Cologuard kit as discussed.  It was a pleasure to see you today!

## 2023-06-09 NOTE — Assessment & Plan Note (Signed)
Controlled.  Following with cardiology. Continue metoprolol tartrate 12.5 mg BID

## 2023-06-09 NOTE — Assessment & Plan Note (Signed)
Progressing.  Continue Lyrica 75 mg once to twice daily during work week. She does not use during weekends.

## 2023-06-09 NOTE — Progress Notes (Signed)
Subjective:    Patient ID: Amber Roberts, female    DOB: [birth year], 62 y.o.   MRN: [id number]  Dizziness Associated symptoms include arthralgias and myalgias. Pertinent negatives include no chest pain, coughing, headaches, numbness or rash.    Amber Roberts is a very pleasant 62 y.o. female who presents today for complete physical and follow up of chronic conditions.  She would also like to discuss breast mass. Her mass is located to the 3 o'clock position under her nipple with distortion for which she noticed 3 months ago. She's also noticed a whitish substance from her nipple over the last few weeks. She denies pain, skin texture changes. She underwent screening mammogram in May 2024 which was normal.   She's also noticed positional dizziness. Over the last 3 to 4 weeks she has noticed dizziness when standing from a seated position.  Her most recent episode was pretty moderate that she felt like she could have fallen.  She has not fallen.  She has managed on amlodipine 5 mg daily and metoprolol tartrate 12.5 mg twice daily for hypertension and palpitations.  Immunizations: -Tetanus: Completed in 2013? Declines today -Shingles: Never completed, declines today   Diet: Fair diet.  Exercise: No regular exercise.  Eye exam: Completes regularly  Dental exam: Completed 3-4 years ago   Pap Smear: Hysterectomy  Mammogram: May 2024 Bone Density Scan: May 2024  Colonoscopy: Never completed. Cologuard orders placed in April 2024. She has yet to complete.  Lung Cancer Screening: Completed in 2019. Declines due to cost.   BP Readings from Last 3 Encounters:  06/09/23 (!) 104/58  10/20/22 100/60  05/14/22 118/62   Wt Readings from Last 3 Encounters:  06/09/23 121 lb (54.9 kg)  10/20/22 127 lb 12.8 oz (58 kg)  05/14/22 125 lb (56.7 kg)       Review of Systems  Constitutional:  Negative for unexpected weight change.  HENT:  Negative for rhinorrhea.   Respiratory:   Negative for cough and shortness of breath.   Cardiovascular:  Negative for chest pain.  Gastrointestinal:  Negative for constipation and diarrhea.  Genitourinary:  Negative for difficulty urinating.  Musculoskeletal:  Positive for arthralgias and myalgias.  Skin:  Negative for rash.  Allergic/Immunologic: Negative for environmental allergies.  Neurological:  Positive for dizziness. Negative for numbness and headaches.         Past Medical History:  Diagnosis Date   Abnormal EKG 08/12/2017   Abnormal Pap smear of vagina    abnl paps of vaginal cuff; last pap 11/2003   Acute pancreatitis 07/15/2020   Cat bite of left hand 06/28/2020   Cervical cancer (HCC) early 20's   Chest pain    Fibromyalgia    GERD (gastroesophageal reflux disease)    Hyperlipidemia    Macrocytic anemia 07/25/2020   MI (myocardial infarction) (HCC)    Osteopenia    Pain in both lower extremities 04/01/2018   Premature menopause [age]   Seasonal allergies    now mostly yearlong   Seizure (HCC) 2010   per pt, related to lowered seizure threshold from imipramine and tramadol)--saw Dr. 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   Drug use: No   Sexual activity: Not Currently  Other Topics Concern   Not on file  Social History Narrative    Lives at home with 12 year old autistic son.  5 cats, 1 dog.     Social Determinants of Health   Financial Resource Strain: Not on file  Food Insecurity: Not on file  Transportation Needs: Not on file  Physical Activity: Not on file  Stress: Not on file  Social Connections: Not on file  Intimate Partner Violence: Not on file    Past Surgical History:  Procedure Laterality Date   surgery for cervical cancer     VAGINAL HYSTERECTOMY     fibroids   WRIST SURGERY     deQuervains    Family History  Problem Relation Age of Onset   Scoliosis Mother    Cancer Father 63       testicular cancer   Autism Son    Cancer Maternal Uncle        esophageal (smoker)   Macular degeneration Maternal Grandmother        wet   Hypertension Maternal Grandmother    Hyperlipidemia Maternal Grandmother    Depression Maternal Grandmother        related to age, loss of vision   Colitis Maternal Grandmother    Breast cancer Maternal Grandmother    Breast cancer Paternal Grandfather    Diabetes Neg Hx     Allergies  Allergen Reactions   Darvon [Propoxyphene Hcl] Nausea And Vomiting    Current Outpatient Medications on File Prior to Visit  Medication Sig Dispense Refill   amLODipine (NORVASC) 5 MG tablet TAKE 1 TABLET BY MOUTH EVERY DAY FOR BLOOD PRESSURE 90 tablet 0   atorvastatin (LIPITOR) 20 MG tablet TAKE 1 TABLET BY MOUTH DAILY. FOR CHOLESTEROL 90 tablet 0   Azelastine HCl 137 MCG/SPRAY SOLN USE 1-2 SPRAYS IN BOTH NOSTRILS TWICE A DAY AS NEEDED FOR RHINITIS FOR ALLERGIES 90 mL 0   lansoprazole (PREVACID) 15 MG capsule Take 15 mg by mouth daily.      LOTEMAX 0.5 % ophthalmic suspension Place 1 drop into the right eye 4 (four) times daily.     metoprolol tartrate (LOPRESSOR) 25 MG tablet TAKE 1/2 TABLET BY MOUTH 2 TIMES DAILY. FOR BLOOD PRESSURE. 90 tablet 0   moxifloxacin (VIGAMOX) 0.5 % ophthalmic solution Place 1 drop into the right eye 4 (four) times daily.     OVER THE COUNTER  MEDICATION Take 15 mg by mouth at bedtime. CBD two tabs at bedtime.     pregabalin (LYRICA) 75 MG capsule Take 1 capsule (75 mg total) by mouth 2 (two) times daily as needed. 60 capsule 0   Vitamin D, Ergocalciferol, (DRISDOL) 1.25 MG (50000 UNIT) CAPS capsule Take 1 capsule by mouth once weekly for 12 weeks. 12 capsule 0   No current facility-administered medications on file prior to visit.    BP (!) 104/58   Pulse 80  Temp 97.6 F (36.4 C) (Temporal)   Ht 5\' 5"  (1.651 m)   Wt 121 lb (54.9 kg)   SpO2 98%   BMI 20.14 kg/m  Objective:   Physical Exam HENT:     Right Ear: Tympanic membrane and ear canal normal.     Left Ear: Tympanic membrane and ear canal normal.     Nose: Nose normal.  Eyes:     Conjunctiva/sclera: Conjunctivae normal.     Pupils: Pupils are equal, round, and reactive to light.  Neck:     Thyroid: No thyromegaly.  Cardiovascular:     Rate and Rhythm: Normal rate and regular rhythm.     Heart sounds: No murmur heard. Pulmonary:     Effort: Pulmonary effort is normal.     Breath sounds: Normal breath sounds. No rales.  Abdominal:     General: Bowel sounds are normal.     Palpations: Abdomen is soft.     Tenderness: There is no abdominal tenderness.  Musculoskeletal:        General: Normal range of motion.     Cervical back: Neck supple.  Lymphadenopathy:     Cervical: No cervical adenopathy.  Skin:    General: Skin is warm and dry.     Findings: No rash.  Neurological:     Mental Status: She is alert and oriented to person, place, and time.     Cranial Nerves: No cranial nerve deficit.     Deep Tendon Reflexes: Reflexes are normal and symmetric.  Psychiatric:        Mood and Affect: Mood normal.           Assessment & Plan:  Encounter for annual general medical examination with abnormal findings in adult Assessment & Plan: Declines Shingrix and tetanus vaccine.  Mammogram UTD, but will place diagnostic ultrasound.  Colonoscopy over due,  she will complete cologuard.  Discussed the importance of a healthy diet and regular exercise in order for weight loss, and to reduce the risk of further co-morbidity.  Exam stable. Labs pending.  Follow up in 1 year for repeat physical.    Aortic atherosclerosis (HCC) Assessment & Plan: Continue atorvastatin 20 mg daily. Repeat lipid panel reviewed from December 2023.   Coronary artery disease involving native heart without angina pectoris, unspecified vessel or lesion type Assessment & Plan: Asymptomatic.  Following with cardiology, office notes and labs reviewed from December 2023. Continue statin therapy, BP control.   Essential hypertension Assessment & Plan: Controlled, but with symptoms of orthostatic hypotension.  Continue metoprolol tartrate 12.5 mg BID. Reduce amlodipine to 2.5 mg daily.  She will monitor blood pressure and report if readings start escalating.  She will also notify if her symptoms persist, at that point we will discontinue amlodipine and closely follow.   Gastroesophageal reflux disease, unspecified whether esophagitis present Assessment & Plan: Controlled.  Continue Prevacid 15 mg daily.   Osteoporosis without current pathological fracture, unspecified osteoporosis type Assessment & Plan: Reviewed bone density scan from May 2024.  Intolerant to Fosamax due to GI side effects. She opts for Prolia. Will get her enrolled.   Chronic neck pain Assessment & Plan: Progressing.  Continue Lyrica 75 mg once to twice daily during work week. She does not use during weekends.    Chronic bilateral thoracic back pain Assessment & Plan: Chronic and continued.  Continue Lyrica 75 mg once to twice daily.   Palpitations Assessment & Plan: Controlled.  Following with cardiology. Continue metoprolol tartrate 12.5  mg BID   Pure hypercholesterolemia Assessment & Plan: Reviewed lipid panel from December 2023. Continue atorvastatin 20 mg  daily.   Orders: -     Lipid panel -     Comprehensive metabolic panel -     CBC  Tobacco use disorder Assessment & Plan: Declines lung cancer screening program due to cost.    Vitamin D deficiency Assessment & Plan: Reviewed vitamin D from December 2023. Continue vitamin D 50,000 international units weekly.  Orders: -     VITAMIN D 25 Hydroxy (Vit-D Deficiency, Fractures)  Subareolar mass of left breast -     MM 3D DIAGNOSTIC MAMMOGRAM BILATERAL BREAST; Future -     [place] LIMITED ULTRASOUND INCLUDING AXILLA LEFT BREAST ; Future        Doreene Nest, NP

## 2023-06-09 NOTE — Assessment & Plan Note (Signed)
Chronic and continued.  Continue Lyrica 75 mg once to twice daily.

## 2023-06-09 NOTE — Assessment & Plan Note (Addendum)
Controlled, but with symptoms of orthostatic hypotension.  Continue metoprolol tartrate 12.5 mg BID. Reduce amlodipine to 2.5 mg daily.  She will monitor blood pressure and report if readings start escalating.  She will also notify if her symptoms persist, at that point we will discontinue amlodipine and closely follow.

## 2023-06-10 ENCOUNTER — Telehealth: Payer: Self-pay | Admitting: Primary Care

## 2023-06-10 NOTE — Telephone Encounter (Signed)
Amber Roberts,  This patient is interested in Prolia injections for osteoporosis.  She could not tolerate alendronate.  Will you get her connected?  Thanks!

## 2023-06-11 NOTE — Telephone Encounter (Signed)
Please check benefits for patient. New start prolia

## 2023-06-14 ENCOUNTER — Telehealth: Payer: Self-pay

## 2023-06-14 NOTE — Telephone Encounter (Signed)
Created new encounter for Prolia BIV. Will route encounter back once benefit verification is complete.  

## 2023-06-14 NOTE — Telephone Encounter (Signed)
Prolia VOB initiated via [email or web address] 

## 2023-06-15 ENCOUNTER — Other Ambulatory Visit (HOSPITAL_COMMUNITY): Payer: Self-pay

## 2023-06-18 ENCOUNTER — Other Ambulatory Visit (HOSPITAL_COMMUNITY): Payer: Self-pay

## 2023-06-18 NOTE — Telephone Encounter (Signed)
Pt ready for scheduling for PROLIA on or after : 06/18/23  Out-of-pocket cost due at time of visit: $1600  Primary: UHC MEDICARE Prolia co-insurance: 25% Admin fee co-insurance: 25%  Secondary: ---- Prolia co-insurance:  Admin fee co-insurance:   Medical Benefit Details: Date Benefits were checked: 06/14/23 Deductible: $1610.96 MET OF $2450 REQUIRED/ Coinsurance: 25%/ Admin Fee: 25%  Prior Auth: APPROVED PA# [id number] Expiration Date: 06/18/23-06/17/24  # of doses approved: 2  Pharmacy benefit: Copay $--- (MUST USE [facility]) If patient wants fill through the pharmacy benefit please send prescription to: [facility], and include estimated need by date in rx notes. Pharmacy will ship medication directly to the office.  Patient NOT eligible for Prolia Copay Card. Copay Card can make patient's cost as little as $25. Link to apply: [email or web address]://[email or web address]  ** This summary of benefits is an estimation of the patient's out-of-pocket cost. Exact cost may very based on individual plan coverage.

## 2023-06-18 NOTE — Telephone Encounter (Signed)
Authorization Number: [id number] 06/18/23-06/17/24

## 2023-06-22 ENCOUNTER — Other Ambulatory Visit: Payer: Self-pay | Admitting: Primary Care

## 2023-06-22 DIAGNOSIS — E7800 Pure hypercholesterolemia, unspecified: Secondary | ICD-10-CM

## 2023-06-22 DIAGNOSIS — I2510 Atherosclerotic heart disease of native coronary artery without angina pectoris: Secondary | ICD-10-CM

## 2023-06-22 MED ORDER — ATORVASTATIN CALCIUM 20 MG PO TABS: 20.0000 mg | ORAL_TABLET | Freq: Every day | ORAL | 3 refills | Status: DC

## 2023-06-23 NOTE — Telephone Encounter (Signed)
Called patient states that she is not able to do at that price. She will give [place] a call when she gets near deductible she will call office and have [place] re check benefits. She states she will continue to take the calcium and vit D as advised.

## 2023-06-23 NOTE — Telephone Encounter (Signed)
See phone note for documentation. No further action needed on this encounter.

## 2023-06-23 NOTE — Telephone Encounter (Signed)
Noted  

## 2023-06-25 ENCOUNTER — Other Ambulatory Visit: Payer: Self-pay | Admitting: Primary Care

## 2023-06-25 DIAGNOSIS — M792 Neuralgia and neuritis, unspecified: Secondary | ICD-10-CM

## 2023-08-14 ENCOUNTER — Other Ambulatory Visit: Payer: Self-pay | Admitting: Primary Care

## 2023-08-14 DIAGNOSIS — I10 Essential (primary) hypertension: Secondary | ICD-10-CM

## 2023-08-20 ENCOUNTER — Other Ambulatory Visit: Payer: Self-pay | Admitting: Primary Care

## 2023-08-20 DIAGNOSIS — J309 Allergic rhinitis, unspecified: Secondary | ICD-10-CM

## 2023-09-13 ENCOUNTER — Other Ambulatory Visit: Payer: Self-pay | Admitting: Primary Care

## 2023-09-13 DIAGNOSIS — E559 Vitamin D deficiency, unspecified: Secondary | ICD-10-CM

## 2023-10-12 ENCOUNTER — Telehealth: Payer: Self-pay | Admitting: Primary Care

## 2023-10-12 ENCOUNTER — Telehealth: Payer: Self-pay | Admitting: Cardiovascular Disease

## 2023-10-12 NOTE — Telephone Encounter (Signed)
Spoke to patient she stated her B/P has been low since Sat 12/7.Stated she feels weak and washed out this morning.Stated she did not take metoprolol and amlodipine this morning.B/P readings listed below.Spoke to Dr.O'Neal he advised to hold metoprolol and amlodipine.Drink plenty of fluids and eat a good diet.Appointment scheduled with Dr.O'Neal 12/16 at 3:30 pm.Advised continue to check B/P daily and bring list of readings.Advised to go to ED if she worsens.

## 2023-10-12 NOTE — Telephone Encounter (Signed)
Pt called back returning Kelli's call. Pt requested a phone call back. Call back # [phone]

## 2023-10-12 NOTE — Telephone Encounter (Signed)
Unable to reach patient. Left voicemail to return call to our office.   

## 2023-10-12 NOTE — Telephone Encounter (Signed)
Unable to reach patient. Left voicemail to return call to our office.   Per chart review patient has appointment with cardiology on 10/18/23.

## 2023-10-12 NOTE — Telephone Encounter (Signed)
Pt c/o BP issue: STAT if pt c/o blurred vision, one-sided weakness or slurred speech  1. What are your last 5 BP readings?  Sat : 110/62  84/50 -standing up   Sun: 126/68  97/61 - standing up   Today 92/56  68/47 - standing up   Has not taken amlodipine but took metoprolol   2. Are you having any other symptoms (ex. Dizziness, headache, blurred vision, passed out)? Weak, dizziness, fatigue   3. What is your BP issue?  Pt BP is very low

## 2023-10-12 NOTE — Telephone Encounter (Signed)
Patient called in and stated that her blood pressure had been running low. Today when sitting it was 93/56 and standing it dropped to 68/47. Yesterday while sitting it was 126/68 and standing was 97/61, Sunday while sitting it was 110/62 and standing was 84/50. She stated that she feels very weak like she is going to melt into the floor. Offered patient appointments with other providers in the office and patient declined, stating that she would rather see her PCP, who is currently out of office. She stated that she will call her cardiologist and see if she can get in with them sooner.

## 2023-10-14 NOTE — Telephone Encounter (Signed)
Unable to reach patient. Left voicemail to return call to our office.   

## 2023-10-15 NOTE — Telephone Encounter (Signed)
Unable to reach patient. Left voicemail to return call to our office.   Mailing letter to patient, after 3 unsuccessful attempts to reach patient.   Per chart review patient has appt with cardiology on 10/18/23

## 2023-10-17 NOTE — Progress Notes (Unsigned)
Cardiology Office Note:  .   Date:  10/18/2023  ID:  Amber Roberts, DOB Nov 14, 1960, MRN [id number] PCP: Doreene Nest, NP  [facility] Providers Cardiologist:  Reatha Harps, MD { History of Present Illness: .   Amber Roberts is a 62 y.o. female with history of HTN, HLD who presents for follow-up.    History of Present Illness   Amber Roberts, a patient with a history of hypertension and hyperlipidemia, presents with symptoms of orthostatic hypotension that started about a week ago. She reports feeling dizzy, particularly when standing up from a sitting position. She has been monitoring her own blood pressure and notes that it drops significantly upon standing. She also reports experiencing nausea and vomiting, which has led to a weight loss of five pounds since the onset of these symptoms. She has been drinking plenty of water and tries to eat regularly, but her appetite has been affected by the nausea. She has stopped taking amlodipine on advice from her primary care provider, but continues to take metoprolol, as stopping it causes her pulse to increase significantly, which she finds distressing. 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 No fevers or chills reported.          Problem List HTN HLD -T chol 217, HDL 217, LDL 106, TG 199    ROS: All other ROS reviewed and negative. Pertinent positives noted in the HPI.     Studies Reviewed: Marland Kitchen   EKG Interpretation Date/Time:  Monday October 18 2023 16:00:32 EST Ventricular Rate:  79 PR Interval:  182 QRS Duration:  92 QT Interval:  386 QTC Calculation: 442 R Axis:   -3  Text Interpretation: Normal sinus rhythm Incomplete right bundle branch block Anteroseptal infarct , age undetermined Confirmed by Lennie Odor [phone]) on 10/18/2023 4:04:57 PM    TTE 07/17/2020  1. Left ventricular ejection fraction, by estimation, is 60 to 65%. The  left  ventricle has normal function. The left ventricle has no regional  wall motion abnormalities. Left ventricular diastolic parameters are  consistent with Grade II diastolic  dysfunction (pseudonormalization). Elevated left atrial pressure.   2. Right ventricular systolic function is normal. The right ventricular  size is normal. Tricuspid regurgitation signal is inadequate for assessing  PA pressure.   3. The mitral valve is normal in structure. Mild to moderate mitral valve  regurgitation. No evidence of mitral stenosis.   4. The aortic valve is normal in structure. Aortic valve regurgitation is  not visualized. No aortic stenosis is present.   5. The inferior vena cava is dilated in size with <50% respiratory  variability, suggesting right atrial pressure of 15 mmHg.  Physical Exam:   VS:  BP 138/79 (BP Location: Right Arm, Patient Position: Sitting, Cuff Size: Normal)   Pulse 79   Ht 5\' 5"  (1.651 m)   Wt 118 lb 12.8 oz (53.9 kg)   SpO2 97%   BMI 19.77 kg/m    Wt Readings from Last 3 Encounters:  10/18/23 118 lb 12.8 oz (53.9 kg)  06/09/23 121 lb (54.9 kg)  10/20/22 127 lb 12.8 oz (58 kg)    GEN: Well nourished, well developed in no acute distress NECK: No JVD; No carotid bruits CARDIAC: RRR, no murmurs, rubs, gallops RESPIRATORY:  Clear to auscultation without rales, wheezing or rhonchi  ABDOMEN: Soft, non-tender, non-distended EXTREMITIES:  No edema; No deformity  ASSESSMENT AND PLAN: .   Assessment  and Plan    Orthostatic Hypotension Episodes of dizziness and blood pressure drops upon standing. Nausea and vomiting associated with these episodes. Currently on Metoprolol and recently stopped Amlodipine. -Discontinue Metoprolol and norvasc.  -Start Ivabradine 5mg  twice daily. -Encourage regular meals and adequate hydration. -Advise use of compression stockings. -Check blood pressure regularly and report changes. -repeat echo.   Hypertension Managed with Amlodipine and  Metoprolol, but Amlodipine recently stopped due to orthostatic hypotension. -Continue without Amlodipine. -Monitor blood pressure regularly.  Follow-up -Repeat echocardiogram to assess cardiac function. -Follow-up appointment in 3 months or sooner based on testing and symptom changes.              Follow-up: Return in about 3 months (around 01/16/2024).  Time Spent with Patient: I have spent a total of 35 minutes caring for this patient today face to face, ordering and reviewing labs/tests, reviewing prior records/medical history, examining the patient, establishing an assessment and plan, communicating results/findings to the patient/family, and documenting in the medical record.   Signed, Lenna Gilford. Flora Lipps, MD, [facility]  [facility]  [address] [place], [place] [address] [phone]  4:30 PM

## 2023-10-18 ENCOUNTER — Ambulatory Visit: Payer: 59 | Admitting: Cardiovascular Disease

## 2023-10-18 ENCOUNTER — Encounter: Payer: Self-pay | Admitting: Cardiovascular Disease

## 2023-10-18 ENCOUNTER — Ambulatory Visit: Payer: 59 | Attending: Cardiovascular Disease | Admitting: Cardiovascular Disease

## 2023-10-18 VITALS — BP 138/79 | HR 79 | Ht 65.0 in | Wt 118.8 lb

## 2023-10-18 DIAGNOSIS — I10 Essential (primary) hypertension: Secondary | ICD-10-CM | POA: Diagnosis not present

## 2023-10-18 DIAGNOSIS — E785 Hyperlipidemia, unspecified: Secondary | ICD-10-CM | POA: Diagnosis not present

## 2023-10-18 DIAGNOSIS — I951 Orthostatic hypotension: Secondary | ICD-10-CM | POA: Diagnosis not present

## 2023-10-18 DIAGNOSIS — R002 Palpitations: Secondary | ICD-10-CM | POA: Diagnosis not present

## 2023-10-18 MED ORDER — IVABRADINE HCL 5 MG PO TABS: 5.0000 mg | ORAL_TABLET | Freq: Two times a day (BID) | ORAL | 3 refills | Status: DC

## 2023-10-18 NOTE — Patient Instructions (Signed)
Medication Instructions:  - STOP Metroprolol - START ivabradine (CORLANOR) 5mg  twice a day with meals    *If you need a refill on your cardiac medications before your next appointment, please call your pharmacy*   Lab Work: None    If you have labs (blood work) drawn today and your tests are completely normal, you will receive your results only by: MyChart Message (if you have MyChart) OR A paper copy in the mail If you have any lab test that is abnormal or we need to change your treatment, we will call you to review the results.   Testing/Procedures: Echo will be scheduled at [address].  Your physician has requested that you have an echocardiogram. Echocardiography is a painless test that uses sound waves to create images of your heart. It provides your doctor with information about the size and shape of your heart and how well your heart's chambers and valves are working. This procedure takes approximately one hour. There are no restrictions for this procedure. Please do NOT wear cologne, perfume, aftershave, or lotions (deodorant is allowed). Please arrive 15 minutes prior to your appointment time.    Follow-Up: At [facility], you and your health needs are our priority.  As part of our continuing mission to provide you with exceptional heart care, we have created designated Provider Care Teams.  These Care Teams include your primary Cardiologist (physician) and Advanced Practice Providers (APPs -  Physician Assistants and Nurse Practitioners) who all work together to provide you with the care you need, when you need it.  We recommend signing up for the patient portal called "MyChart".  Sign up information is provided on this After Visit Summary.  MyChart is used to connect with patients for Virtual Visits (Telemedicine).  Patients are able to view lab/test results, encounter notes, upcoming appointments, etc.  Non-urgent messages can be sent to your provider as  well.   To learn more about what you can do with MyChart, go to [email or web address].    Your next appointment:   3 month(s)  The format for your next appointment:   In Person  Provider:   Reatha Harps, MD    Other Instructions Dr. Flora Lipps recommends checking your blood pressure daily and keeping a log of the readings until your next appointment.

## 2023-10-19 ENCOUNTER — Telehealth: Payer: Self-pay | Admitting: Pharmacy Technician

## 2023-10-19 ENCOUNTER — Other Ambulatory Visit (HOSPITAL_COMMUNITY): Payer: Self-pay

## 2023-10-19 ENCOUNTER — Encounter: Payer: Self-pay | Admitting: Cardiovascular Disease

## 2023-10-19 MED ORDER — METOPROLOL SUCCINATE ER 25 MG PO TB24
25.0000 mg | ORAL_TABLET | Freq: Every day | ORAL | 2 refills | Status: DC
  Filled 2023-10-19 – 2023-11-16 (×2): qty 90, 90d supply, fill #0

## 2023-10-19 NOTE — Telephone Encounter (Signed)
Pharmacy Patient Advocate Encounter   Received notification from Patient Advice Request messages that prior authorization for ivabradine is required/requested.   Insurance verification completed.   The patient is insured through [facility] .   Per test claim: PA required; PA submitted to above mentioned insurance via CoverMyMeds Key/confirmation #/EOC [id number] Status is pending

## 2023-10-19 NOTE — Telephone Encounter (Signed)
Pharmacy Patient Advocate Encounter  Received notification from [facility] that Prior Authorization for ivabradine has been APPROVED from 10/19/23 to 04/18/24   PA #/Case ID/Reference #: [id number]

## 2023-10-20 ENCOUNTER — Other Ambulatory Visit (HOSPITAL_COMMUNITY): Payer: Self-pay

## 2023-10-20 MED ORDER — MIDODRINE HCL 5 MG PO TABS
5.0000 mg | ORAL_TABLET | Freq: Three times a day (TID) | ORAL | 0 refills | Status: DC
  Filled 2023-10-20: qty 90, 30d supply, fill #0

## 2023-10-20 NOTE — Addendum Note (Signed)
Addended by: Sande Rives on: 10/20/2023 08:52 AM   Modules accepted: Orders

## 2023-10-21 ENCOUNTER — Other Ambulatory Visit (HOSPITAL_COMMUNITY): Payer: Self-pay

## 2023-10-25 ENCOUNTER — Telehealth: Payer: Self-pay

## 2023-10-25 NOTE — Telephone Encounter (Signed)
Last read by Gabriel Cirri "Christie" at  9:17 AM on 10/24/2023.

## 2023-11-06 ENCOUNTER — Other Ambulatory Visit: Payer: Self-pay | Admitting: Primary Care

## 2023-11-06 DIAGNOSIS — E559 Vitamin D deficiency, unspecified: Secondary | ICD-10-CM

## 2023-11-08 ENCOUNTER — Telehealth: Payer: Self-pay | Admitting: Primary Care

## 2023-11-08 NOTE — Telephone Encounter (Signed)
 Spoke to pt, scheduled ov for 11/12/23

## 2023-11-08 NOTE — Telephone Encounter (Signed)
 Copied from CRM [id number]. Topic: Referral - Question >> Nov 08, 2023  9:47 AM Leotis ORN wrote: Reason for CRM: Pt is wanting a referral for neurosurgeon, would like to know if she needs to be seen or if a referral can be put in  she is asking who the provider recommends

## 2023-11-08 NOTE — Telephone Encounter (Signed)
 Patient will need to be seen at an appt in order to have proper documentation for referral. Please schedule.

## 2023-11-12 ENCOUNTER — Ambulatory Visit: Payer: 59 | Admitting: Cardiovascular Disease

## 2023-11-12 ENCOUNTER — Ambulatory Visit: Payer: 59 | Admitting: Primary Care

## 2023-11-16 ENCOUNTER — Other Ambulatory Visit: Payer: Self-pay | Admitting: Primary Care

## 2023-11-16 ENCOUNTER — Other Ambulatory Visit (HOSPITAL_COMMUNITY): Payer: Self-pay

## 2023-11-16 ENCOUNTER — Encounter: Payer: Self-pay | Admitting: Cardiovascular Disease

## 2023-11-16 DIAGNOSIS — M792 Neuralgia and neuritis, unspecified: Secondary | ICD-10-CM

## 2023-11-16 MED ORDER — PREGABALIN 75 MG PO CAPS: 75.0000 mg | ORAL_CAPSULE | Freq: Two times a day (BID) | ORAL | 0 refills | Status: DC | PRN

## 2023-11-18 ENCOUNTER — Other Ambulatory Visit (HOSPITAL_COMMUNITY): Payer: Self-pay

## 2023-11-18 ENCOUNTER — Telehealth: Payer: Self-pay

## 2023-11-18 NOTE — Telephone Encounter (Signed)
Prolia VOB initiated via [email or web address] 

## 2023-11-23 ENCOUNTER — Other Ambulatory Visit (HOSPITAL_COMMUNITY): Payer: Self-pay

## 2023-11-23 NOTE — Telephone Encounter (Signed)
PA on file for buy-and-bill. PA# [id number], 06/18/23-06/17/24  PA for pharmacy benefit submitted via CMM. Key: [id number]

## 2023-11-23 NOTE — Telephone Encounter (Signed)
 Marland Kitchen

## 2023-11-24 ENCOUNTER — Other Ambulatory Visit (HOSPITAL_COMMUNITY): Payer: Self-pay

## 2023-11-24 NOTE — Telephone Encounter (Signed)
Pharmacy Patient Advocate Encounter  Received notification from [facility] that Prior Authorization for PROLIA has been APPROVED from 11/23/23 to 11/22/25   PA #/Case ID/Reference #: [id number]    MUST USE SPECIALTY PHARMACY: CALL [phone]

## 2023-11-24 NOTE — Telephone Encounter (Signed)
Pt ready for scheduling for PROLIA on or after : 11/24/23  Out-of-pocket cost due at time of visit: $442.50 + 1684.51 (DEDUCTIBLE)  Number of injection/visits approved: 2  Primary: [employer] Prolia co-insurance: 25% Admin fee co-insurance: 25%  Secondary: --- Prolia co-insurance:  Admin fee co-insurance:   Medical Benefit Details: Date Benefits were checked: 11/19/23 Deductible: $15.49 Met of $1700 Required/ Coinsurance: 25%/ Admin Fee: 25%  Prior Auth: APPROVED PA#  [id number] (BUY & BILL) PA#  [id number] (PHARMACY BENEFIT) Expiration Date: 06/18/23-06/17/24 (BUY & BILL) Expiration Date: 11/23/23-11/22/25 (PHARMACY BENEFIT)   # of doses approved: 2  Pharmacy benefit: Copay $--- MUST FILL AT SPECIALTY PHARMACY If patient wants fill through the pharmacy benefit please send prescription to: [facility], and include estimated need by date in rx notes. Pharmacy will ship medication directly to the office.  Patient IS eligible for Prolia Copay Card. Copay Card can make patient's cost as little as $25. Link to apply: [email or web address]  ** This summary of benefits is an estimation of the patient's out-of-pocket cost. Exact cost may very based on individual plan coverage.

## 2023-11-25 ENCOUNTER — Ambulatory Visit (HOSPITAL_COMMUNITY)
Admission: RE | Admit: 2023-11-25 | Discharge: 2023-11-25 | Disposition: A | Discharge status: Home / Self Care | Payer: 59 | Source: Ambulatory Visit | Attending: Internal Medicine | Admitting: Internal Medicine

## 2023-11-25 ENCOUNTER — Encounter: Payer: Self-pay | Admitting: Cardiovascular Disease

## 2023-11-25 DIAGNOSIS — I10 Essential (primary) hypertension: Secondary | ICD-10-CM | POA: Diagnosis present

## 2023-11-25 LAB — ECHOCARDIOGRAM COMPLETE
AR max vel: 2.87 cm2
AV Area VTI: 3 cm2
AV Area mean vel: 2.89 cm2
AV Mean grad: 1 mm[Hg]
AV Peak grad: 2.4 mm[Hg]
Ao pk vel: 0.77 m/s
Area-P 1/2: 2.96 cm2
MV M vel: 0.68 m/s
MV Peak grad: 1.8 mm[Hg]
S' Lateral: 2.46 cm

## 2023-12-08 ENCOUNTER — Ambulatory Visit: Payer: 59 | Admitting: Family

## 2023-12-08 ENCOUNTER — Encounter: Payer: Self-pay | Admitting: Family

## 2023-12-08 ENCOUNTER — Other Ambulatory Visit: Payer: Self-pay | Admitting: Primary Care

## 2023-12-08 VITALS — BP 126/72 | HR 103 | Temp 100.5°F | Ht 65.0 in | Wt 120.8 lb

## 2023-12-08 DIAGNOSIS — I10 Essential (primary) hypertension: Secondary | ICD-10-CM

## 2023-12-08 DIAGNOSIS — E7800 Pure hypercholesterolemia, unspecified: Secondary | ICD-10-CM

## 2023-12-08 DIAGNOSIS — R509 Fever, unspecified: Secondary | ICD-10-CM | POA: Diagnosis not present

## 2023-12-08 DIAGNOSIS — E559 Vitamin D deficiency, unspecified: Secondary | ICD-10-CM

## 2023-12-08 DIAGNOSIS — R051 Acute cough: Secondary | ICD-10-CM

## 2023-12-08 DIAGNOSIS — R0989 Other specified symptoms and signs involving the circulatory and respiratory systems: Secondary | ICD-10-CM

## 2023-12-08 MED ORDER — OSELTAMIVIR PHOSPHATE 75 MG PO CAPS: 75.0000 mg | ORAL_CAPSULE | Freq: Two times a day (BID) | ORAL | 0 refills | Status: AC

## 2023-12-08 MED ORDER — BENZONATATE 200 MG PO CAPS: 200.0000 mg | ORAL_CAPSULE | Freq: Two times a day (BID) | ORAL | 0 refills | Status: DC | PRN

## 2023-12-08 NOTE — Progress Notes (Signed)
 Established Patient Office Visit  Subjective:   Patient ID: Amber Roberts, female    DOB: 24-Oct-1961  Age: 63 y.o. MRN: [id number]  CC:  Chief Complaint  Patient presents with   Acute Visit    Reports body aches and chills since this morning.     HPI: Amber Roberts is a 63 y.o. female presenting on 12/08/2023 for Acute Visit (Reports body aches and chills since this morning. )   Woke up with body aches and chills this am.  She couldn't find her thermometer so couldn't check  She does have a fever in office today.  She states she gets flu yearly, and this often feels this way.  Having some cough with clear productive sputum.  Also with nasal congestion.          ROS: Negative unless specifically indicated above in HPI.   Relevant past medical history reviewed and updated as indicated.   Allergies and medications reviewed and updated.   Current Outpatient Medications:    atorvastatin  (LIPITOR) 20 MG tablet, Take 1 tablet (20 mg total) by mouth daily. for cholesterol., Disp: 90 tablet, Rfl: 3   lansoprazole (PREVACID) 15 MG capsule, Take 15 mg by mouth daily. , Disp: , Rfl:    metoprolol  succinate (TOPROL  XL) 25 MG 24 hr tablet, Take 1 tablet (25 mg total) by mouth daily., Disp: 90 tablet, Rfl: 2   oseltamivir  (TAMIFLU ) 75 MG capsule, Take 1 capsule (75 mg total) by mouth 2 (two) times daily for 5 days., Disp: 10 capsule, Rfl: 0   OVER THE COUNTER MEDICATION, Take 15 mg by mouth at bedtime. CBD two tabs at bedtime., Disp: , Rfl:    pregabalin  (LYRICA ) 75 MG capsule, Take 1 capsule (75 mg total) by mouth 2 (two) times daily as needed., Disp: 60 capsule, Rfl: 0   Vitamin D , Ergocalciferol , (DRISDOL ) 1.25 MG (50000 UNIT) CAPS capsule, TAKE 1 CAPSULE BY MOUTH ONCE WEEKLY FOR 12 WEEKS., Disp: 12 capsule, Rfl: 0  Allergies  Allergen Reactions   Darvon [Propoxyphene Hcl] Nausea And Vomiting    Objective:   BP 126/72 (BP Location: Left Arm, Patient Position:  Sitting, Cuff Size: Normal)   Pulse (!) 103   Temp (!) 100.5 F (38.1 C) (Oral)   Ht 5' 5 (1.651 m)   Wt 120 lb 12.8 oz (54.8 kg)   SpO2 98%   BMI 20.10 kg/m    Physical Exam Constitutional:      General: She is not in acute distress.    Appearance: Normal appearance. She is normal weight. She is not ill-appearing, toxic-appearing or diaphoretic.  HENT:     Head: Normocephalic.     Right Ear: Tympanic membrane normal.     Left Ear: Tympanic membrane normal.     Nose: Nose normal.     Mouth/Throat:     Mouth: Mucous membranes are dry.     Pharynx: No oropharyngeal exudate or posterior oropharyngeal erythema.  Eyes:     Extraocular Movements: Extraocular movements intact.     Pupils: Pupils are equal, round, and reactive to light.  Cardiovascular:     Rate and Rhythm: Normal rate and regular rhythm.     Pulses: Normal pulses.     Heart sounds: Normal heart sounds.  Pulmonary:     Effort: Pulmonary effort is normal.     Breath sounds: Normal breath sounds.  Musculoskeletal:     Cervical back: Normal range of motion.  Neurological:  General: No focal deficit present.     Mental Status: She is alert and oriented to person, place, and time. Mental status is at baseline.  Psychiatric:        Mood and Affect: Mood normal.        Behavior: Behavior normal.        Thought Content: Thought content normal.        Judgment: Judgment normal.     Assessment & Plan:  Fever, unspecified fever cause Assessment & Plan: Pt declines testing for flu and or covid, states too expensive with her insurance right now.  Will treat with tamiflu  for suspected flu with associated symptoms.  Advised patient on supportive measures:  Be sure to rest, drink plenty of fluids, and use tylenol  or ibuprofen  as needed for pain. Follow up if fever >101, if symptoms worsen or if symptoms are not improved in 3 days. Patient verbalizes understanding.     Suspected novel influenza A virus infection -      Oseltamivir  Phosphate; Take 1 capsule (75 mg total) by mouth 2 (two) times daily for 5 days.  Dispense: 10 capsule; Refill: 0     Follow up plan: Return for f/u PCP if no improvement in symptoms.  Ginger Patrick, FNP

## 2023-12-08 NOTE — Addendum Note (Signed)
 Addended by: Felicita Horns on: 12/08/2023 12:24 PM   Modules accepted: Orders

## 2023-12-08 NOTE — Assessment & Plan Note (Signed)
 Pt declines testing for flu and or covid, states too expensive with her insurance right now.  Will treat with tamiflu  for suspected flu with associated symptoms.  Advised patient on supportive measures:  Be sure to rest, drink plenty of fluids, and use tylenol  or ibuprofen  as needed for pain. Follow up if fever >101, if symptoms worsen or if symptoms are not improved in 3 days. Patient verbalizes understanding.

## 2024-01-15 LAB — LAB REPORT - SCANNED: EGFR: 99

## 2024-01-17 NOTE — Progress Notes (Unsigned)
 Cardiology Office Note:  .   Date:  01/20/2024  ID:  Amber Roberts, DOB [birth year], MRN [id number] PCP: Doreene Nest, NP  [facility] Providers Cardiologist:  Reatha Harps, MD { Chief Complaint  Patient presents with   Follow-up    Amber Roberts is a 63 y.o. female with history of orthostatic hypotension who presents for follow-up.    History of Present Illness   Amber Roberts "Amber Roberts" is a 63 year old female with hypertension and hyperlipidemia who presents for follow-up.  She was evaluated for dizzy spells and hypotension in December. Since discontinuing her blood pressure medication, she has not experienced further drops in blood pressure. Previously, she felt 'horrible' when not taking the medication, but now feels better without it. She is curious about why her blood pressure has normalized after being on medication for a long time.  She has a history of tachycardia, but since switching to long-acting metoprolol, she has not experienced palpitations and her heart rate has been stable. She takes metoprolol succinate 25 mg daily and regularly monitors her blood pressure and heart rate, noting stability.  She experiences very bad allergies, which she believes contribute to nausea and vomiting, particularly in the morning. She has drainage and a reactive reflex, but these symptoms are infrequent.  She reported minimal swelling in her ankles and feet about a week ago when the weather first became warm, but it has not recurred. She drinks two and a half liters of water daily.  She smokes approximately a pack every two days and acknowledges the need to quit. She had previously quit but started again about a year and a half ago. No chest pain or trouble breathing.          Problem List HTN HLD -T chol 217, HDL 77, LDL 106, TG 199 3. Orthostatic hypotension 4. Tobacco abuse     ROS: All other ROS reviewed and negative. Pertinent positives noted  in the HPI.     Studies Reviewed: Marland Kitchen        TTE 11/25/2023  1. Left ventricular ejection fraction, by estimation, is 55 to 60%. Left  ventricular ejection fraction by 3D volume is 56 %. The left ventricle has  normal function. The left ventricle has no regional wall motion  abnormalities. Left ventricular diastolic   parameters are consistent with Grade I diastolic dysfunction (impaired  relaxation). The average left ventricular global longitudinal strain is  -15.0 %.   2. Right ventricular systolic function is normal. The right ventricular  size is normal. There is normal pulmonary artery systolic pressure.   3. The mitral valve is normal in structure. Trivial mitral valve  regurgitation. No evidence of mitral stenosis.   4. The aortic valve is tricuspid. Aortic valve regurgitation is not  visualized. No aortic stenosis is present.   5. The inferior vena cava is normal in size with greater than 50%  respiratory variability, suggesting right atrial pressure of 3 mmHg.   Physical Exam:   VS:  BP 122/76 (BP Location: Left Arm, Patient Position: Sitting)   Pulse 81   Ht 5\' 5"  (1.651 m)   Wt 122 lb (55.3 kg)   PF 100 L/min   BMI 20.30 kg/m    Wt Readings from Last 3 Encounters:  01/20/24 122 lb (55.3 kg)  12/08/23 120 lb 12.8 oz (54.8 kg)  10/18/23 118 lb 12.8 oz (53.9 kg)    GEN: Well nourished, well developed in no acute distress  NECK: No JVD; No carotid bruits CARDIAC: RRR, no murmurs, rubs, gallops RESPIRATORY:  Clear to auscultation without rales, wheezing or rhonchi  ABDOMEN: Soft, non-tender, non-distended EXTREMITIES:  No edema; No deformity  ASSESSMENT AND PLAN: .   Assessment and Plan    Tachycardia No episodes since starting metoprolol. Heart rate stable with normal readings. - Continue metoprolol succinate 25 mg daily.  Hypertension Well-controlled with metoprolol. Blood pressure 122/76 mmHg. No hypotension since stopping amlodipine. - Continue metoprolol  succinate 25 mg daily. - Refill metoprolol prescription.  Hyperlipidemia Managed with Lipitor 20 mg daily. - Continue Lipitor 20 mg daily.  Tobacco use disorder Smokes half a pack per day. Discussed cessation due to husband's cancer diagnosis. She acknowledges need to quit. - 3 min smoking cessation counseling provided in office today.              Follow-up: Return in about 1 year (around 01/19/2025).  Signed, Lenna Gilford. Flora Lipps, MD, [facility]  [facility]  [address], [address] [place], [place] [address] [phone]  8:21 AM

## 2024-01-19 DIAGNOSIS — E559 Vitamin D deficiency, unspecified: Secondary | ICD-10-CM

## 2024-01-20 ENCOUNTER — Ambulatory Visit: Payer: 59 | Attending: Cardiovascular Disease | Admitting: Cardiovascular Disease

## 2024-01-20 ENCOUNTER — Encounter: Payer: Self-pay | Admitting: Cardiovascular Disease

## 2024-01-20 VITALS — BP 122/76 | HR 81 | Ht 65.0 in | Wt 122.0 lb

## 2024-01-20 DIAGNOSIS — E785 Hyperlipidemia, unspecified: Secondary | ICD-10-CM

## 2024-01-20 DIAGNOSIS — I10 Essential (primary) hypertension: Secondary | ICD-10-CM | POA: Diagnosis not present

## 2024-01-20 DIAGNOSIS — I951 Orthostatic hypotension: Secondary | ICD-10-CM | POA: Diagnosis not present

## 2024-01-20 DIAGNOSIS — F17210 Nicotine dependence, cigarettes, uncomplicated: Secondary | ICD-10-CM | POA: Diagnosis not present

## 2024-01-20 DIAGNOSIS — Z720 Tobacco use: Secondary | ICD-10-CM | POA: Diagnosis not present

## 2024-01-20 MED ORDER — VITAMIN D (ERGOCALCIFEROL) 1.25 MG (50000 UNIT) PO CAPS
50000.0000 [IU] | ORAL_CAPSULE | ORAL | 0 refills | Status: DC
Start: 2024-01-20 — End: 2024-04-23

## 2024-01-20 MED ORDER — METOPROLOL SUCCINATE ER 25 MG PO TB24: 25.0000 mg | ORAL_TABLET | Freq: Every day | ORAL | 2 refills | Status: DC

## 2024-01-20 NOTE — Patient Instructions (Addendum)

## 2024-04-10 ENCOUNTER — Encounter (HOSPITAL_COMMUNITY): Payer: Self-pay

## 2024-04-10 ENCOUNTER — Telehealth: Payer: Self-pay | Admitting: Cardiovascular Disease

## 2024-04-10 ENCOUNTER — Emergency Department (HOSPITAL_COMMUNITY)
Admission: EM | Admit: 2024-04-10 | Discharge: 2024-04-10 | Discharge status: Against Medical Advice | Attending: Emergency Medicine | Admitting: Emergency Medicine

## 2024-04-10 ENCOUNTER — Other Ambulatory Visit: Payer: Self-pay

## 2024-04-10 ENCOUNTER — Telehealth: Payer: Self-pay

## 2024-04-10 DIAGNOSIS — R55 Syncope and collapse: Secondary | ICD-10-CM | POA: Insufficient documentation

## 2024-04-10 DIAGNOSIS — R42 Dizziness and giddiness: Secondary | ICD-10-CM | POA: Insufficient documentation

## 2024-04-10 DIAGNOSIS — R002 Palpitations: Secondary | ICD-10-CM | POA: Diagnosis not present

## 2024-04-10 DIAGNOSIS — Z5321 Procedure and treatment not carried out due to patient leaving prior to being seen by health care provider: Secondary | ICD-10-CM | POA: Diagnosis not present

## 2024-04-10 LAB — CBG MONITORING, ED: Glucose-Capillary: 113 mg/dL — ABNORMAL HIGH (ref 70–99)

## 2024-04-10 LAB — CBC
HCT: 40.2 % (ref 36.0–46.0)
Hemoglobin: 13.4 g/dL (ref 12.0–15.0)
MCH: 33.8 pg (ref 26.0–34.0)
MCHC: 33.3 g/dL (ref 30.0–36.0)
MCV: 101.5 fL — ABNORMAL HIGH (ref 80.0–100.0)
Platelets: 212 10*3/uL (ref 150–400)
RBC: 3.96 MIL/uL (ref 3.87–5.11)
RDW: 13.5 % (ref 11.5–15.5)
WBC: 7.3 10*3/uL (ref 4.0–10.5)
nRBC: 0 % (ref 0.0–0.2)

## 2024-04-10 LAB — COMPREHENSIVE METABOLIC PANEL WITH GFR
ALT: 22 U/L (ref 0–44)
AST: 33 U/L (ref 15–41)
Albumin: 4 g/dL (ref 3.5–5.0)
Alkaline Phosphatase: 87 U/L (ref 38–126)
Anion gap: 10 (ref 5–15)
BUN: 11 mg/dL (ref 8–23)
CO2: 22 mmol/L (ref 22–32)
Calcium: 8.7 mg/dL — ABNORMAL LOW (ref 8.9–10.3)
Chloride: 99 mmol/L (ref 98–111)
Creatinine, Ser: 0.84 mg/dL (ref 0.44–1.00)
GFR, Estimated: >60 mL/min (ref >60)
Glucose, Bld: 133 mg/dL — ABNORMAL HIGH (ref 70–99)
Potassium: 4 mmol/L (ref 3.5–5.1)
Sodium: 131 mmol/L — ABNORMAL LOW (ref 135–145)
Total Bilirubin: 0.8 mg/dL (ref 0.0–1.2)
Total Protein: 7.4 g/dL (ref 6.5–8.1)

## 2024-04-10 LAB — TSH: TSH: 0.728 u[IU]/mL (ref 0.350–4.500)

## 2024-04-10 NOTE — Telephone Encounter (Signed)
 Amber Roberts

## 2024-04-10 NOTE — ED Provider Triage Note (Signed)
 Emergency Medicine Provider Triage Evaluation Note  Amber Roberts , a 63 y.o. female  was evaluated in triage.  Pt complains of light headedness, near syncope . 1 week ago had severe palpitations. Has been taking metoprolol  for palpitations. She has been having persistent orthostatic near syncope for the past 2 days. Hx of stress MI Review of Systems  Positive: Near sycnope Negative: fever  Physical Exam  BP (!) 137/91 (BP Location: Left Arm)   Pulse (!) 105   Temp 98.7 F (37.1 C) (Oral)   Resp 16   SpO2 97%  Gen:   Awake, no distress   Resp:  Normal effort  MSK:   Moves extremities without difficulty  Other:   Medical Decision Making  Medically screening exam initiated at 6:47 PM.  Appropriate orders placed.  SOLE LENGACHER was informed that the remainder of the evaluation will be completed by another provider, this initial triage assessment does not replace that evaluation, and the importance of remaining in the ED until their evaluation is complete.     Tama Fails, PA-C 04/10/24 1850

## 2024-04-10 NOTE — ED Triage Notes (Signed)
 PT arrives via POV. Pt reports over a week ago, she had palpitations that lasted 36 hours. PT states palpitations have resolved. She arrives today, reports dizziness since yesterday that only occurs with standing. Pt is AxOx4. Denies any other associated symptoms at this time.

## 2024-04-10 NOTE — Telephone Encounter (Addendum)
 I spoke with pt; pt did not go to ED. Pt has already spoken with cardiology this morning and pt waiting on response from card. Pt said would call [facility] back if needed. UC & ED precautions given and pt voiced understanding. Sending note to Tresea Frost NP.

## 2024-04-10 NOTE — Telephone Encounter (Signed)
 Pt c/o BP issue: STAT if pt c/o blurred vision, one-sided weakness or slurred speech.  STAT if BP is GREATER than 180/120 TODAY.  STAT if BP is LESS than 90/60 and SYMPTOMATIC TODAY  1. What is your BP concern? Pt states she has orthostatic hypertension.   2. Have you taken any BP medication today? She states she is not on bp meds anymore   3. What are your last 5 BP readings?  Yesterday  117/73 - laying down  113/65 - sat up  91/59 - standing up    4. Are you having any other symptoms (ex. Dizziness, headache, blurred vision, passed out)? 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

## 2024-04-10 NOTE — Telephone Encounter (Signed)
 Noted

## 2024-04-10 NOTE — Telephone Encounter (Signed)
 Call to patient to discuss symptoms. Patient explains that she has a history of MI, dizzy spells and hypotension. A week ago, she had a 36 hour episode of palpitations, she states her HR was 120 and she felt nauseated and sick from the palpitations. Then she says she was fine until yesterday, when she noticed she felt dizzy on standing. 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 She states that yesterday she "felt like I was going to pass out". She gives her BP readings during that period as: 117/73 - laying down  113/65 - sat up  91/59 - standing up  But states her HR remained 70 the entire time. She denies chest pain or leg swelling, and states she has been drinking lots of water to try and prevent further hypotension. She states today she feels "Fine" and refused to go to ED even though PCP advised her to because "I don't have any symptoms today". She states she has continued to take her toprol  because she doesn't want the palpitations to come back as they are "Severe" and make her feel "horrible".  Advised patient to go to ED for evaluation. Patient verbalized understanding and asks to pre-emptively make post-hospital follow up. Offered appt with NP on 04/18/24, patient accepted.

## 2024-04-11 ENCOUNTER — Encounter: Payer: Self-pay | Admitting: Cardiovascular Disease

## 2024-04-11 NOTE — Telephone Encounter (Signed)
 Pt went to the hospital yesterday and she states nothing was done so she left and would like to know what she should do.

## 2024-04-11 NOTE — Telephone Encounter (Signed)
Please review ED note and advise.

## 2024-04-13 NOTE — Telephone Encounter (Signed)
 Provider recommendations relayed via mychart to patient by Reuben Castilla, RN   Last read by Miranda America at 7:23AM on 04/13/2024.   Closing encounter.

## 2024-04-15 ENCOUNTER — Other Ambulatory Visit: Payer: Self-pay

## 2024-04-15 ENCOUNTER — Emergency Department (HOSPITAL_BASED_OUTPATIENT_CLINIC_OR_DEPARTMENT_OTHER)
Admission: EM | Admit: 2024-04-15 | Discharge: 2024-04-15 | Disposition: A | Discharge status: Home / Self Care | Attending: Emergency Medicine | Admitting: Emergency Medicine

## 2024-04-15 DIAGNOSIS — M21331 Wrist drop, right wrist: Secondary | ICD-10-CM | POA: Diagnosis present

## 2024-04-15 DIAGNOSIS — G5631 Lesion of radial nerve, right upper limb: Secondary | ICD-10-CM | POA: Diagnosis not present

## 2024-04-15 NOTE — ED Notes (Signed)
Reviewed discharge instructions and home care with pt. Pt verbalized understanding and had no further questions. Pt exited ED without complications.

## 2024-04-15 NOTE — ED Provider Notes (Signed)
 [place] EMERGENCY DEPARTMENT AT [facility] Provider Note   CSN: [id number] Arrival date & time: 04/15/24  1400     Patient presents with: Hand Problem   Amber Roberts is a 63 y.o. female.   Patient presents to the emergency department for evaluation of right wrist drop starting acutely last night and around 11 PM.  Patient states that she fell asleep on her couch for about 20 minutes.  She did not fall asleep in any position that put compression on her wrist.  When she woke up, she attempted to grab her phone and could not grasp it.  She had numbness in her hand along the radial aspect, dorsally.  No pain.  She reports difficulty extending her wrist. Patient denies signs of stroke including: facial droop, slurred speech, aphasia, weakness/numbness in other extremities, imbalance/trouble walking.  Patient has had some cervical spine issues in the past.  Follows with Dr. Gwendlyn Lemmings of neurosurgery.       Prior to Admission medications   Medication Sig Start Date End Date Taking? Authorizing Provider  atorvastatin  (LIPITOR) 20 MG tablet Take 1 tablet (20 mg total) by mouth daily. for cholesterol. 06/22/23   Clark, Katherine K, NP  benzonatate  (TESSALON ) 200 MG capsule Take 1 capsule (200 mg total) by mouth 2 (two) times daily as needed for cough. 12/08/23   Dugal, Tabitha, FNP  lansoprazole (PREVACID) 15 MG capsule Take 15 mg by mouth daily.  05/19/20   [provider]  metoprolol  succinate (TOPROL  XL) 25 MG 24 hr tablet Take 1 tablet (25 mg total) by mouth daily. 01/20/24   O'NealCathay Clonts, MD  OVER THE COUNTER MEDICATION Take 15 mg by mouth at bedtime. CBD two tabs at bedtime.    [provider]  pregabalin  (LYRICA ) 75 MG capsule Take 1 capsule (75 mg total) by mouth 2 (two) times daily as needed. 11/16/23   Gabriel John, NP  Vitamin D , Ergocalciferol , (DRISDOL ) 1.25 MG (50000 UNIT) CAPS capsule Take 1 capsule (50,000 Units total) by mouth every 7  (seven) days. Take 1 capsule by mouth once weekly for 12 weeks. 01/20/24   Gabriel John, NP    Allergies: Darvon [propoxyphene hcl]    Review of Systems  Updated Vital Signs BP 126/68   Pulse 78   Temp 98.1 F (36.7 C)   Resp 15   SpO2 98%   Physical Exam Vitals and nursing note reviewed.  Constitutional:      Appearance: She is well-developed.  HENT:     Head: Normocephalic and atraumatic.   Eyes:     Pupils: Pupils are equal, round, and reactive to light.    Cardiovascular:     Pulses: Normal pulses. No decreased pulses.     Comments: 2+ radial pulse in extremities bilaterally  Musculoskeletal:        General: Tenderness present.     Right shoulder: No tenderness. Normal range of motion.     Right upper arm: No swelling or tenderness.     Right elbow: Normal range of motion. No tenderness.     Right forearm: No swelling or tenderness.     Right wrist: No tenderness or bony tenderness. Decreased range of motion.     Right hand: Decreased sensation of the radial distribution. Normal capillary refill. Normal pulse.     Cervical back: Normal range of motion and neck supple.     Comments: Full range of motion of C-spine, no paraspinous tenderness.   Skin:  General: Skin is warm and dry.   Neurological:     Mental Status: She is alert.     Sensory: Sensory deficit present.     Motor: Weakness present.     Comments: Patient is unable to extend right wrist against gravity.  She reports decreased sensation over the dorsum of the hand along the radial aspect.  Psychiatric:        Mood and Affect: Mood normal.    ED Course  Patient seen and examined. History obtained directly from patient.   Labs/EKG: None ordered  Imaging: None ordered  Medications/Fluids: None ordered  Most recent vital signs reviewed and are as follows: There were no vitals taken for this visit.  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  4:37 PM Reassessment performed. Patient appears stable.  Patient discussed with and seen by Dr. Efraim Grange, agrees with radial nerve palsy.  Will treat supportively.  Have patient follow-up.  Reviewed pertinent lab work and imaging with patient at bedside. Questions answered.   Most current vital signs reviewed and are as follows: BP 126/68   Pulse 78   Temp 98.1 F (36.7 C)   Resp 15   SpO2 98%   Plan: Discharge to home.   Prescriptions written for: None  Other home care instructions discussed: Use of splint  ED return instructions discussed: Return with new or progressive symptoms, signs of stroke.  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  Follow-up instructions discussed: Patient encouraged to follow-up with their PCP/neurosurgeon in 1 week    (all labs ordered are listed, but only abnormal results are displayed) Labs Reviewed - No data to display  EKG: None  Radiology: No results found.   Procedures   Medications Ordered in the ED - No data to display                                  Medical Decision Making  Patient with symptoms consistent with radial nerve palsy on the right side.  Unclear cause.  Symptoms are localized.  Low concern for central cause such as CVA at this time.  No headache, neck, shoulder or elbow pain.  Will treat supportively.  Patient has outpatient follow-up with neurosurgery.     Final diagnoses:  Radial nerve palsy, right    ED Discharge Orders     None          Lyna Sandhoff, Kirby Peoples 04/15/24 1638    Ninetta Basket, MD 04/18/24 [phone]

## 2024-04-15 NOTE — Discharge Instructions (Signed)
 Please continue to wear the splint and rest your wrist is much as possible.  Please follow-up with your PCP and neurosurgeon in upcoming week.  Return if you have weakness in other areas of your body, facial droop, difficulty talking or speaking, or other any signs or symptoms of a stroke.

## 2024-04-15 NOTE — ED Notes (Signed)
 ED Provider at bedside.

## 2024-04-15 NOTE — ED Triage Notes (Signed)
 States unable to move R wrist. Hand flops. Thumb is numb. Chronic neck issues. Started after waking up last night around 2300. Pulse present,

## 2024-04-18 ENCOUNTER — Ambulatory Visit: Admitting: Cardiology

## 2024-04-20 ENCOUNTER — Encounter (HOSPITAL_BASED_OUTPATIENT_CLINIC_OR_DEPARTMENT_OTHER): Payer: Self-pay | Admitting: Cardiovascular Disease

## 2024-04-20 NOTE — Telephone Encounter (Signed)
 Due to recent change in providers schedule, the appt for 6/17 was canceled but pt was offered next available on 8/1 and declined. She'd like a callback from nurse to see if appt is still necessary. Please advise

## 2024-04-20 NOTE — Telephone Encounter (Signed)
 Left a message for the pt to call back.

## 2024-04-20 NOTE — Telephone Encounter (Signed)
Routing to correct triage pool

## 2024-04-22 ENCOUNTER — Other Ambulatory Visit: Payer: Self-pay | Admitting: Primary Care

## 2024-04-22 DIAGNOSIS — E559 Vitamin D deficiency, unspecified: Secondary | ICD-10-CM

## 2024-04-24 ENCOUNTER — Ambulatory Visit: Admitting: Physician Assistant

## 2024-04-27 ENCOUNTER — Telehealth: Payer: Self-pay | Admitting: Cardiovascular Disease

## 2024-04-27 NOTE — Telephone Encounter (Signed)
 STAT if patient feels like he/she is going to faint   1. Are you feeling dizzy, lightheaded, or faint right now? No  but comes and go. Most recent episode this morning around 7:30a that lasted about 20-30 secs. Then again around 9/10a and the again 2pm   2. Have you passed out?  No (If yes move to .SYNCOPECHMG)   3. Do you have any other symptoms? No   4. Have you checked your HR and BP (record if available)? 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

## 2024-04-27 NOTE — Telephone Encounter (Signed)
 Spoke with pt, she reports she is having episodes of feeling faint, it has happened 3 times today. She reports this feels different than what she usually feels. She reports she is getting brain fog and forgets where she is going. She is able to smile and is not having stroke like symptoms. Advised her that if it happens again to go to the ER. Patient voiced understanding to have someone drive her.

## 2024-04-27 NOTE — Telephone Encounter (Signed)
 Patient identification verified by 2 forms. Shirl Ellen, RN     Patient scheduled with APP for 7/17. Per provider recommendations, keep f/u appt with app after ED visit.

## 2024-05-18 ENCOUNTER — Ambulatory Visit: Attending: Nurse Practitioner | Admitting: Nurse Practitioner

## 2024-05-18 ENCOUNTER — Encounter: Payer: Self-pay | Admitting: Nurse Practitioner

## 2024-05-18 VITALS — BP 100/60 | HR 67 | Ht 65.0 in | Wt 120.4 lb

## 2024-05-18 DIAGNOSIS — I951 Orthostatic hypotension: Secondary | ICD-10-CM | POA: Diagnosis not present

## 2024-05-18 DIAGNOSIS — R002 Palpitations: Secondary | ICD-10-CM | POA: Diagnosis not present

## 2024-05-18 DIAGNOSIS — I10 Essential (primary) hypertension: Secondary | ICD-10-CM

## 2024-05-18 DIAGNOSIS — E785 Hyperlipidemia, unspecified: Secondary | ICD-10-CM | POA: Diagnosis not present

## 2024-05-18 DIAGNOSIS — Z720 Tobacco use: Secondary | ICD-10-CM

## 2024-05-18 NOTE — Patient Instructions (Signed)
  Follow-Up: At [facility], you and your health needs are our priority.  As part of our continuing mission to provide you with exceptional heart care, our providers are all part of one team.  This team includes your primary Cardiologist (physician) and Advanced Practice Providers or APPs (Physician Assistants and Nurse Practitioners) who all work together to provide you with the care you need, when you need it.  Your next appointment:   4 month(s)  Provider:   Darryle ONEIDA Decent, MD or Damien Braver, NP

## 2024-05-18 NOTE — Progress Notes (Signed)
 Office Visit    Patient Name: Amber Roberts Date of Encounter: 05/18/2024  Primary Care Provider:  Gretta Comer POUR, NP Primary Cardiologist:  Darryle ONEIDA Decent, MD  Chief Complaint    63 year old female with a history of tachycardia, hypertension, orthostatic hypotension, hyperlipidemia, cervical cancer, fibromyalgia, GERD, and tobacco use who presents for follow-up related to palpitations and near syncope.  Past Medical History    Past Medical History:  Diagnosis Date   Abnormal EKG 08/12/2017   Abnormal Pap smear of vagina    abnl paps of vaginal cuff; last pap 11/2003   Acute pancreatitis 07/15/2020   Cat bite of left hand 06/28/2020   Cervical cancer (HCC) early 20's   Chest pain    Fibromyalgia    GERD (gastroesophageal reflux disease)    Hyperlipidemia    Macrocytic anemia 07/25/2020   MI (myocardial infarction) (HCC)    Osteopenia    Pain in both lower extremities 04/01/2018   Premature menopause 37-38   Seasonal allergies    now mostly yearlong   Seizure (HCC) 2010   per pt, related to lowered seizure threshold from imipramine and tramadol )--saw Dr. Jenel   Smoker    Thoracic spine pain 07/14/2021   Past Surgical History:  Procedure Laterality Date   surgery for cervical cancer     VAGINAL HYSTERECTOMY     fibroids   WRIST SURGERY     deQuervains    Allergies  Allergies  Allergen Reactions   Darvon [Propoxyphene Hcl] Nausea And Vomiting     Labs/Other Studies Reviewed    The following studies were reviewed today:  Cardiac Studies & Procedures   ______________________________________________________________________________________________   STRESS TESTS  EXERCISE TOLERANCE TEST (ETT) 08/20/2017  Interpretation Summary  Blood pressure demonstrated a normal response to exercise.  There was no ST segment deviation noted during stress.  ETT with fair exercise tolerance (7:00); no chest pain; normal BP response; no ST changes;  negative adequate ETT; Duke treadmill score 7.   ECHOCARDIOGRAM  ECHOCARDIOGRAM COMPLETE 11/25/2023  Narrative ECHOCARDIOGRAM REPORT    Patient Name:   Amber Roberts [facility] Date of Exam: 11/25/2023 Medical Rec #:  [id number]           Height:       65.0 in Accession #:    [id number]          Weight:       118.8 lb Date of Birth:  [birth year]           BSA:          1.585 m Patient Age:    62 years            BP:           127/77 mmHg Patient Gender: F                   HR:           87 bpm. Exam Location:  Outpatient  Procedure: 2D Echo, 3D Echo, Cardiac Doppler, Color Doppler and Strain Analysis  Indications:    Essential hypertension [I10 (ICD-10-CM)] Incomplete right bundle branch block, dizziness  History:        Patient has prior history of Echocardiogram examinations, most recent 07/17/2020. Previous Myocardial Infarction; Risk Factors:Dyslipidemia, Hypertension and Current Smoker. Note: patient states she had viral gastroenteritis within the past week, with subsequent dehydration; still recovering.  Sonographer:    Rosaline Fujisawa MHA, RDMS, RVT, RDCS Referring Phys: [id number] [facility] O'NEAL  Sonographer Comments: Technically difficult study due to poor echo windows. Image acquisition challenging due to respiratory motion, Image acquisition challenging due to patient body habitus and pain. Global longitudinal strain was attempted. IMPRESSIONS   1. Left ventricular ejection fraction, by estimation, is 55 to 60%. Left ventricular ejection fraction by 3D volume is 56 %. The left ventricle has normal function. The left ventricle has no regional wall motion abnormalities. Left ventricular diastolic parameters are consistent with Grade I diastolic dysfunction (impaired relaxation). The average left ventricular global longitudinal strain is -15.0 %. 2. Right ventricular systolic function is normal. The right ventricular size is normal. There is normal pulmonary artery  systolic pressure. 3. The mitral valve is normal in structure. Trivial mitral valve regurgitation. No evidence of mitral stenosis. 4. The aortic valve is tricuspid. Aortic valve regurgitation is not visualized. No aortic stenosis is present. 5. The inferior vena cava is normal in size with greater than 50% respiratory variability, suggesting right atrial pressure of 3 mmHg.  FINDINGS Left Ventricle: Left ventricular ejection fraction, by estimation, is 55 to 60%. Left ventricular ejection fraction by 3D volume is 56 %. The left ventricle has normal function. The left ventricle has no regional wall motion abnormalities. The average left ventricular global longitudinal strain is -15.0 %. The left ventricular internal cavity size was normal in size. There is no left ventricular hypertrophy. Left ventricular diastolic parameters are consistent with Grade I diastolic dysfunction (impaired relaxation). Indeterminate filling pressures.  Right Ventricle: The right ventricular size is normal. No increase in right ventricular wall thickness. Right ventricular systolic function is normal. There is normal pulmonary artery systolic pressure. The tricuspid regurgitant velocity is 0.53 m/s, and with an assumed right atrial pressure of 3 mmHg, the estimated right ventricular systolic pressure is 4.1 mmHg.  Left Atrium: Left atrial size was normal in size.  Right Atrium: Right atrial size was normal in size.  Pericardium: There is no evidence of pericardial effusion.  Mitral Valve: The mitral valve is normal in structure. Trivial mitral valve regurgitation. No evidence of mitral valve stenosis.  Tricuspid Valve: The tricuspid valve is normal in structure. Tricuspid valve regurgitation is not demonstrated. No evidence of tricuspid stenosis.  Aortic Valve: The aortic valve is tricuspid. Aortic valve regurgitation is not visualized. No aortic stenosis is present. Aortic valve mean gradient measures 1.0 mmHg.  Aortic valve peak gradient measures 2.4 mmHg. Aortic valve area, by VTI measures 3.00 cm.  Pulmonic Valve: The pulmonic valve was normal in structure. Pulmonic valve regurgitation is not visualized. No evidence of pulmonic stenosis.  Aorta: The aortic root is normal in size and structure.  Venous: The inferior vena cava is normal in size with greater than 50% respiratory variability, suggesting right atrial pressure of 3 mmHg.  IAS/Shunts: No atrial level shunt detected by color flow Doppler.   LEFT VENTRICLE PLAX 2D LVIDd:         3.57 cm         Diastology LVIDs:         2.46 cm         LV e' medial:    3.94 cm/s LV PW:         0.66 cm         LV E/e' medial:  11.6 LV IVS:        0.63 cm         LV e' lateral:   6.75 cm/s LVOT diam:     1.82 cm  LV E/e' lateral: 6.8 LV SV:         40 LV SV Index:   25              2D LVOT Area:     2.60 cm        Longitudinal Strain 2D Strain GLS  -16.0 % (A2C): 2D Strain GLS  -15.3 % (A3C): 2D Strain GLS  -13.8 % (A4C): 2D Strain GLS  -15.0 % Avg:  3D Volume EF LV 3D EF:    Left ventricul ar ejection fraction by 3D volume is 56 %.  3D Volume EF: 3D EF:        56 % LV EDV:       69 ml LV ESV:       30 ml LV SV:        39 ml  RIGHT VENTRICLE            IVC RV Basal diam:  3.19 cm    IVC diam: 1.76 cm RV Mid diam:    2.59 cm RV S prime:     9.82 cm/s TAPSE (M-mode): 2.7 cm  LEFT ATRIUM             Index        RIGHT ATRIUM           Index LA diam:        2.32 cm 1.46 cm/m   RA Area:     12.20 cm LA Vol (A2C):   19.9 ml 12.55 ml/m  RA Volume:   26.20 ml  16.53 ml/m LA Vol (A4C):   36.6 ml 23.09 ml/m LA Biplane Vol: 27.5 ml 17.35 ml/m AORTIC VALVE AV Area (Vmax):    2.87 cm AV Area (Vmean):   2.89 cm AV Area (VTI):     3.00 cm AV Vmax:           77.00 cm/s AV Vmean:          51.300 cm/s AV VTI:            0.132 m AV Peak Grad:      2.4 mmHg AV Mean Grad:      1.0 mmHg LVOT Vmax:         85.00  cm/s LVOT Vmean:        56.900 cm/s LVOT VTI:          0.152 m LVOT/AV VTI ratio: 1.15  AORTA Ao Root diam: 2.59 cm Ao Asc diam:  2.82 cm  MITRAL VALVE               TRICUSPID VALVE MV Area (PHT): 2.96 cm    TR Peak grad:   1.1 mmHg MV Decel Time: 256 msec    TR Vmax:        53.00 cm/s MR Peak grad: 1.8 mmHg MR Vmax:      68.00 cm/s   SHUNTS MV E velocity: 45.70 cm/s  Systemic VTI:  0.15 m MV A velocity: 94.40 cm/s  Systemic Diam: 1.82 cm MV E/A ratio:  0.48  Annabella Scarce MD Electronically signed by Annabella Scarce MD Signature Date/Time: 11/25/2023/10:04:46 AM    Final          ______________________________________________________________________________________________     Recent Labs: 04/10/2024: ALT 22; BUN 11; Creatinine, Ser 0.84; Hemoglobin 13.4; Platelets 212; Potassium 4.0; Sodium 131; TSH 0.728  Recent Lipid Panel    Component Value Date/Time   CHOL 217 (H) 10/29/2022 0847   TRIG  199 (H) 10/29/2022 0847   HDL 77 10/29/2022 0847   CHOLHDL 2.8 10/29/2022 0847   CHOLHDL 6 12/30/2021 1311   VLDL 17 07/17/2020 0435   LDLCALC 106 (H) 10/29/2022 0847   LDLDIRECT 136.0 12/30/2021 1311    History of Present Illness    63 year old female with the above past medical history including tachycardia, hypertension, orthostatic hypotension, hyperlipidemia, cervical cancer, fibromyalgia, GERD, and tobacco use.  ETT in 2018 was normal. Most recent echocardiogram 11/2023 EF 55 to 60%, normal LV function, no RWMA, G1 DD, normal RV systolic function, no significant valvular abnormalities.  She was last seen in the office on 01/20/2024 and was stable from a cardiac standpoint.  She noticed improvement in palpitations with transition from metoprolol  to tartrate to metoprolol  succinate.  BP stabilized with discontinuation of additional antihypertensive medication.  She was seen in the ED on 04/10/2024 in the setting of palpitations, orthostatic dizziness.  Workup was  unremarkable.  Outpatient follow-up with cardiology was recommended.  She should return to the ED on 04/15/2024 in the setting of radial nerve palsy.  Outpatient follow-up with PCP was recommended.  She presents today for follow-up.  Since her last visit and since her recent ED visits she has been stable from a cardiac standpoint.  Following her most recent ED visit she did note some ongoing orthostatic dizziness, she denies any presyncope or syncope, denies any recurrent palpitations.  BP is low in office today, though she reports it has been stable recently at home.  She has been under a significant amount of stress as her husband was recently diagnosed with cancer and has had 2 emergency surgeries in the past month.  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 She continues to smoke.  Home Medications    Current Outpatient Medications  Medication Sig Dispense Refill   atorvastatin  (LIPITOR) 20 MG tablet Take 1 tablet (20 mg total) by mouth daily. for cholesterol. 90 tablet 3   cetirizine (ZYRTEC) 5 MG tablet Take by mouth daily.     lansoprazole (PREVACID) 15 MG capsule Take 15 mg by mouth daily.      metoprolol  succinate (TOPROL  XL) 25 MG 24 hr tablet Take 1 tablet (25 mg total) by mouth daily. 90 tablet 2   OVER THE COUNTER MEDICATION Take 15 mg by mouth at bedtime. CBD two tabs at bedtime.     pregabalin  (LYRICA ) 75 MG capsule Take 1 capsule (75 mg total) by mouth 2 (two) times daily as needed. 60 capsule 0   Vitamin D , Ergocalciferol , (DRISDOL ) 1.25 MG (50000 UNIT) CAPS capsule TAKE 1 CAPSULE BY MOUTH ONCE WEEKLY FOR 12 WEEKS. 12 capsule 0   No current facility-administered medications for this visit.     Review of Systems    She denies chest pain, dyspnea, pnd, orthopnea, n, v, syncope, edema, weight gain, or early satiety. All other systems reviewed and are otherwise negative except as noted above.   Physical Exam    VS:  BP 100/60   Pulse 67   Ht 5' 5 (1.651 m)   Wt 120 lb  6.4 oz (54.6 kg)   SpO2 97%   BMI 20.04 kg/m   GEN: Well nourished, well developed, in no acute distress. HEENT: normal. Neck: Supple, no JVD, carotid bruits, or masses. Cardiac: RRR, no murmurs, rubs, or gallops. No clubbing, cyanosis, edema.  Radials/DP/PT 2+ and equal bilaterally.  Respiratory:  Respirations regular and unlabored, clear to auscultation bilaterally. GI: Soft, nontender, nondistended, BS +  x 4. MS: no deformity or atrophy. Skin: warm and dry, no rash. Neuro:  Strength and sensation are intact. Psych: Normal affect.  Accessory Clinical Findings    ECG personally reviewed by me today -    - no EKG in office today.   Lab Results  Component Value Date   WBC 7.3 04/10/2024   HGB 13.4 04/10/2024   HCT 40.2 04/10/2024   MCV 101.5 (H) 04/10/2024   PLT 212 04/10/2024   Lab Results  Component Value Date   CREATININE 0.84 04/10/2024   BUN 11 04/10/2024   NA 131 (L) 04/10/2024   K 4.0 04/10/2024   CL 99 04/10/2024   CO2 22 04/10/2024   Lab Results  Component Value Date   ALT 22 04/10/2024   AST 33 04/10/2024   ALKPHOS 87 04/10/2024   BILITOT 0.8 04/10/2024   Lab Results  Component Value Date   CHOL 217 (H) 10/29/2022   HDL 77 10/29/2022   LDLCALC 106 (H) 10/29/2022   LDLDIRECT 136.0 12/30/2021   TRIG 199 (H) 10/29/2022   CHOLHDL 2.8 10/29/2022    Lab Results  Component Value Date   HGBA1C 5.3 07/09/2017    Assessment & Plan    1. Tachycardia/palpitations/near syncope: Recent episode of increased palpitations, orthostatic dizziness with near syncope.  She does have ongoing mild orthostatic dizziness, this is not new.  She denies any further palpitations, denies presyncope or syncope.   Most recent echocardiogram 11/2023 EF 55 to 60%, normal LV function, no RWMA, G1 DD, normal RV systolic function, no significant valvular abnormalities. We discussed possible decrease in metoprolol  dosing, however, she prefers to continue current dose as her symptoms/BP  have been stable overall.  Given almost complete resolution of symptoms, will defer cardiac monitor.  Reviewed ED precautions.  2. Hypertension/orthostatic hypotension: BP runs low, she reports ongoing mild orthostatic dizziness, denies any recent presyncope or syncope.  Overall stable.  Discussed supportive measures including adequate hydration, gradual position changes, abdominal compression, thigh sleeves.  For now, continue metoprolol  at current dose.   3. Hyperlipidemia: LDL was 89 in 12/2023. Continue Lipitor.  4. Tobacco use: She continues to smoke.  Full cessation advised.  5. Disposition: Follow-up in 4 months, sooner if needed.   Damien JAYSON Braver, NP 05/21/2024, 11:10 AM

## 2024-05-21 ENCOUNTER — Encounter: Payer: Self-pay | Admitting: Nurse Practitioner

## 2024-06-09 ENCOUNTER — Encounter: Admitting: Primary Care

## 2024-06-19 ENCOUNTER — Ambulatory Visit (INDEPENDENT_AMBULATORY_CARE_PROVIDER_SITE_OTHER): Admitting: Primary Care

## 2024-06-19 VITALS — BP 108/62 | HR 82 | Temp 97.2°F | Ht 65.0 in | Wt 120.0 lb

## 2024-06-19 DIAGNOSIS — E2839 Other primary ovarian failure: Secondary | ICD-10-CM

## 2024-06-19 DIAGNOSIS — M542 Cervicalgia: Secondary | ICD-10-CM

## 2024-06-19 DIAGNOSIS — Z0000 Encounter for general adult medical examination without abnormal findings: Secondary | ICD-10-CM

## 2024-06-19 DIAGNOSIS — K219 Gastro-esophageal reflux disease without esophagitis: Secondary | ICD-10-CM

## 2024-06-19 DIAGNOSIS — Z1211 Encounter for screening for malignant neoplasm of colon: Secondary | ICD-10-CM

## 2024-06-19 DIAGNOSIS — F17200 Nicotine dependence, unspecified, uncomplicated: Secondary | ICD-10-CM

## 2024-06-19 DIAGNOSIS — I10 Essential (primary) hypertension: Secondary | ICD-10-CM

## 2024-06-19 DIAGNOSIS — M810 Age-related osteoporosis without current pathological fracture: Secondary | ICD-10-CM

## 2024-06-19 DIAGNOSIS — G8929 Other chronic pain: Secondary | ICD-10-CM

## 2024-06-19 DIAGNOSIS — Z1231 Encounter for screening mammogram for malignant neoplasm of breast: Secondary | ICD-10-CM

## 2024-06-19 DIAGNOSIS — E559 Vitamin D deficiency, unspecified: Secondary | ICD-10-CM

## 2024-06-19 DIAGNOSIS — E7800 Pure hypercholesterolemia, unspecified: Secondary | ICD-10-CM

## 2024-06-19 DIAGNOSIS — R002 Palpitations: Secondary | ICD-10-CM

## 2024-06-19 NOTE — Assessment & Plan Note (Signed)
Controlled.  Continue lansoprazole 15 mg daily.

## 2024-06-19 NOTE — Assessment & Plan Note (Signed)
 Repeat vitamin D  level pending. Continue 50,000 IUs of vitamin D3 once weekly.

## 2024-06-19 NOTE — Assessment & Plan Note (Signed)
 Repeat lipid panel pending. Continue atorvastatin 20 mg daily.

## 2024-06-19 NOTE — Assessment & Plan Note (Signed)
 Controlled.  Continue metoprolol  succinate 25 mg daily. Following with cardiology, office notes reviewed in July 2025.

## 2024-06-19 NOTE — Progress Notes (Signed)
 Subjective:    Patient ID: Amber Roberts, female    DOB: 10/30/1961, 63 y.o.   MRN: [id number]  History of Present Illness    Amber Roberts is a very pleasant 63 y.o. female who presents today for complete physical and follow up of chronic conditions.  Immunizations: -Tetanus: Completed in 2013, declines  -Shingles: Never completed, declines    Diet: Fair diet.  Exercise: No regular exercise.  Eye exam: Completes annually  Dental exam: Completes semi-annually    Pap Smear: Hysterectomy Mammogram: Completed in August 2024 Bone Density Scan: Completed in August 2024  Colonoscopy: Never completed, declined last year.  Lung Cancer Screening: Completed in 2019, declines due to cost.   BP Readings from Last 3 Encounters:  06/19/24 108/62  05/18/24 100/60  04/15/24 126/68      Review of Systems  Constitutional:  Negative for unexpected weight change.  HENT:  Negative for rhinorrhea.   Respiratory:  Negative for cough and shortness of breath.   Cardiovascular:  Negative for chest pain.  Gastrointestinal:  Negative for constipation and diarrhea.  Genitourinary:  Negative for difficulty urinating.  Musculoskeletal:  Negative for arthralgias and myalgias.  Skin:  Negative for rash.  Allergic/Immunologic: Negative for environmental allergies.  Neurological:  Negative for dizziness and headaches.  Psychiatric/Behavioral:  The patient is not nervous/anxious.          Past Medical History:  Diagnosis Date   Abnormal EKG 08/12/2017   Abnormal Pap smear of vagina    abnl paps of vaginal cuff; last pap 11/2003   Acute pancreatitis 07/15/2020   Cat bite of left hand 06/28/2020   Cervical cancer (HCC) early 20's   Chest pain    Fibromyalgia    GERD (gastroesophageal reflux disease)    Hyperlipidemia    Macrocytic anemia 07/25/2020   MI (myocardial infarction) (HCC)    Osteopenia    Pain in both lower extremities 04/01/2018   Premature menopause 37-38    Seasonal allergies    now mostly yearlong   Seizure (HCC) 2010   per pt, related to lowered seizure threshold from imipramine and tramadol )--saw Dr. 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   Drug use: No   Sexual activity: Not Currently  Other Topics Concern   Not on file  Social History Narrative   Lives at home with 7 year old autistic son.  5 cats, 1 dog.     Social Drivers of Corporate investment banker Strain: Low Risk  (06/19/2024)   Overall Financial Resource Strain (CARDIA)    Difficulty of Paying Living Expenses: Not very hard  Food Insecurity: No Food  Insecurity (06/19/2024)   Hunger Vital Sign    Worried About Running Out of Food in the Last Year: Never true    Ran Out of Food in the Last Year: Never true  Transportation Needs: No Transportation Needs (06/19/2024)   PRAPARE - Administrator, Civil Service (Medical): No    Lack of Transportation (Non-Medical): No  Physical Activity: Inactive (06/19/2024)   Exercise Vital Sign    Days of Exercise per Week: 0 days    Minutes of Exercise per Session: Not on file  Stress: No Stress Concern Present (06/19/2024)   [employer] of Occupational Health - Occupational Stress Questionnaire    Feeling of Stress: Only a little   Social Connections: Moderately Integrated (06/19/2024)   Social Connection and Isolation Panel    Frequency of Communication with Friends and Family: More than three times a week    Frequency of Social Gatherings with Friends and Family: Once a week    Attends Religious Services: 1 to 4 times per year    Active Member of [employer] or Organizations: No    Attends Engineer, structural: Not on file    Marital Status: Married  Catering manager Violence: Not on file    Past Surgical History:  Procedure Laterality Date   surgery for cervical cancer     VAGINAL HYSTERECTOMY     fibroids   WRIST SURGERY     deQuervains    Family History  Problem Relation Age of Onset   Scoliosis Mother    Cancer Father 30       testicular cancer   Cancer Maternal Uncle        esophageal (smoker)   Macular degeneration Maternal Grandmother        wet   Hypertension Maternal Grandmother    Hyperlipidemia Maternal Grandmother    Depression Maternal Grandmother        related to age, loss of vision   Colitis Maternal Grandmother    Breast cancer Paternal Grandfather    Autism Son    Diabetes Neg Hx     Allergies  Allergen Reactions   Darvon [Propoxyphene Hcl] Nausea And Vomiting    Current Outpatient Medications on File Prior to Visit  Medication Sig Dispense Refill   atorvastatin  (LIPITOR) 20 MG tablet Take 1 tablet (20 mg total) by mouth daily. for cholesterol. 90 tablet 3   cetirizine (ZYRTEC) 5 MG tablet Take by mouth daily.     lansoprazole (PREVACID) 15 MG capsule Take 15 mg by mouth daily.      metoprolol  succinate (TOPROL  XL) 25 MG 24 hr tablet Take 1 tablet (25 mg total) by mouth daily. 90 tablet 2   OVER THE COUNTER MEDICATION Take 15 mg by mouth at bedtime. CBD two tabs at bedtime.     pregabalin  (LYRICA ) 75 MG capsule Take 1 capsule (75 mg total) by mouth 2 (two) times daily as needed. 60 capsule 0   Vitamin D , Ergocalciferol , (DRISDOL ) 1.25 MG (50000 UNIT) CAPS capsule TAKE  1 CAPSULE BY MOUTH ONCE WEEKLY FOR 12 WEEKS. 12 capsule 0   No current facility-administered medications on file prior to visit.    BP 108/62   Pulse 82   Temp (!) 97.2 F (36.2 C) (Temporal)   Ht 5' 5 (1.651 m)   Wt 120 lb (54.4 kg)   SpO2 97%   BMI 19.97 kg/m  Objective:   Physical Exam HENT:     Right Ear: Tympanic membrane and  ear canal normal.     Left Ear: Tympanic membrane and ear canal normal.  Eyes:     Pupils: Pupils are equal, round, and reactive to light.  Cardiovascular:     Rate and Rhythm: Normal rate and regular rhythm.  Pulmonary:     Effort: Pulmonary effort is normal.     Breath sounds: Normal breath sounds.  Abdominal:     General: Bowel sounds are normal.     Palpations: Abdomen is soft.     Tenderness: There is no abdominal tenderness.  Musculoskeletal:        General: Normal range of motion.     Cervical back: Neck supple.  Skin:    General: Skin is warm and dry.  Neurological:     Mental Status: She is alert and oriented to person, place, and time.     Cranial Nerves: No cranial nerve deficit.     Deep Tendon Reflexes:     Reflex Scores:      Patellar reflexes are 2+ on the right side and 2+ on the left side. Psychiatric:        Mood and Affect: Mood normal.     Physical Exam        Assessment & Plan:  Preventative health care Assessment & Plan: Declines Shingrix and tetanus vaccines today. Mammogram and bone density scan due, orders placed. Colonoscopy overdue, referral placed to GI  Discussed the importance of a healthy diet and regular exercise in order for weight loss, and to reduce the risk of further co-morbidity.  Exam stable. Labs pending.  Follow up in 1 year for repeat physical.    Essential hypertension Assessment & Plan: Controlled.  Continue lansoprazole 15 mg daily.    Gastroesophageal reflux disease, unspecified whether esophagitis present Assessment & Plan: Controlled.  Continue Prevacid 15 mg  daily.   Osteoporosis without current pathological fracture, unspecified osteoporosis type Assessment & Plan: Repeat bone density scan pending.   Vitamin D  deficiency Assessment & Plan: Repeat vitamin D  level pending. Continue 50,000 IUs of vitamin D3 once weekly.  Orders: -     VITAMIN D  25 Hydroxy (Vit-D Deficiency, Fractures)  Tobacco use disorder Assessment & Plan: Continues to smoke.  Declines lung cancer screening today.   Pure hypercholesterolemia Assessment & Plan: Repeat lipid panel pending.  Continue atorvastatin  20 mg daily.  Orders: -     Lipid panel  Palpitations Assessment & Plan: Controlled.  Continue metoprolol  succinate 25 mg daily. Following with cardiology, office notes reviewed in July 2025.   Chronic neck pain Assessment & Plan: Stable.  Continue Lyrica  75 mg twice daily as needed.   Screening mammogram for breast cancer -     3D Screening Mammogram, Left and Right; Future  Estrogen deficiency -     DG Bone Density; Future  Screening for colon cancer -     Ambulatory referral to Gastroenterology    Assessment and Plan Assessment & Plan         Comer MARLA Gaskins, NP

## 2024-06-19 NOTE — Assessment & Plan Note (Signed)
 Asymptomatic.  Following with cardiology, office notes

## 2024-06-19 NOTE — Assessment & Plan Note (Signed)
 Stable.  Continue Lyrica  75 mg twice daily as needed.

## 2024-06-19 NOTE — Assessment & Plan Note (Signed)
 Declines Shingrix and tetanus vaccines today. Mammogram and bone density scan due, orders placed. Colonoscopy overdue, referral placed to GI  Discussed the importance of a healthy diet and regular exercise in order for weight loss, and to reduce the risk of further co-morbidity.  Exam stable. Labs pending.  Follow up in 1 year for repeat physical.

## 2024-06-19 NOTE — Assessment & Plan Note (Signed)
Controlled.  Continue Prevacid 15 mg daily.

## 2024-06-19 NOTE — Assessment & Plan Note (Signed)
 Continues to smoke.  Declines lung cancer screening today.

## 2024-06-19 NOTE — Assessment & Plan Note (Signed)
Repeat bone density scan pending. °

## 2024-06-19 NOTE — Patient Instructions (Signed)
 Stop by the lab prior to leaving today. I will notify you of your results once received.   [facility].   You will be contacted regarding the GI referral from the colonoscopy.  It was a pleasure to see you today!

## 2024-06-21 DIAGNOSIS — G8929 Other chronic pain: Secondary | ICD-10-CM

## 2024-06-22 MED ORDER — TRAMADOL HCL 50 MG PO TABS: 50.0000 mg | ORAL_TABLET | Freq: Every evening | ORAL | 0 refills | Status: DC | PRN

## 2024-06-22 NOTE — Telephone Encounter (Signed)
 Kelli, can you find out what's going on with her Tramadol  Rx?

## 2024-06-22 NOTE — Telephone Encounter (Signed)
Please start PA for tramadol. Thanks!

## 2024-06-23 ENCOUNTER — Telehealth: Payer: Self-pay

## 2024-06-23 ENCOUNTER — Other Ambulatory Visit (HOSPITAL_COMMUNITY): Payer: Self-pay

## 2024-06-23 ENCOUNTER — Telehealth: Payer: Self-pay | Admitting: Pharmacy Technician

## 2024-06-23 NOTE — Telephone Encounter (Signed)
 Pharmacy Patient Advocate Encounter  Received notification from [facility] that Prior Authorization for traMADol  HCl 50MG  tablets   has been APPROVED from 06/23/24 to 12/24/24. Ran test claim, Copay is $0.33. This test claim was processed through [facility]- copay amounts may vary at other pharmacies due to pharmacy/plan contracts, or as the patient moves through the different stages of their insurance plan.   PA #/Case ID/Reference #: [id number]

## 2024-06-23 NOTE — Telephone Encounter (Signed)
 Pharmacy Patient Advocate Encounter   Received notification from Patient Advice Request messages that prior authorization for traMADol  HCl 50MG  tablets  is required/requested.   Insurance verification completed.   The patient is insured through [facility] .   Per test claim: PA required; PA submitted to above mentioned insurance via Latent Key/confirmation #/EOC [id number] Status is pending

## 2024-06-23 NOTE — Telephone Encounter (Signed)
 Patient notified by mychart.

## 2024-06-23 NOTE — Telephone Encounter (Signed)
 Dulicate

## 2024-07-16 ENCOUNTER — Other Ambulatory Visit: Payer: Self-pay | Admitting: Primary Care

## 2024-07-16 DIAGNOSIS — E559 Vitamin D deficiency, unspecified: Secondary | ICD-10-CM

## 2024-07-16 NOTE — Telephone Encounter (Signed)
 Patient needs a lab appointment to recheck her vitamin D  and cholesterol level. If she wants to have this level drawn at placement appointment I can place an order in our system.  Or she can go to [facility].  Let me know.  Also, how many capsules of vitamin D  does she have left?

## 2024-07-17 NOTE — Telephone Encounter (Signed)
 Unable to reach patient. Left voicemail to return call to our office.

## 2024-07-18 ENCOUNTER — Other Ambulatory Visit: Payer: Self-pay | Admitting: Primary Care

## 2024-07-18 DIAGNOSIS — G8929 Other chronic pain: Secondary | ICD-10-CM

## 2024-07-18 NOTE — Telephone Encounter (Signed)
 Spoke with pt relaying Kate's message. Pt states she's been putting off getting labs done, although not intentionally. Says she will get one of the providers at her office to order labs with [facility] and will have results sent to [facility]. Also, pt thinks she has about 2 caps of vit D left.

## 2024-07-19 NOTE — Telephone Encounter (Signed)
 LVM per dpr advising patient of Meredith message.

## 2024-07-19 NOTE — Telephone Encounter (Signed)
 Please thank her for that information. We will refill her vitamin D  once she's had her labs drawn and I have the report. Thanks.

## 2024-07-21 ENCOUNTER — Other Ambulatory Visit: Payer: Self-pay | Admitting: Primary Care

## 2024-07-21 DIAGNOSIS — E7800 Pure hypercholesterolemia, unspecified: Secondary | ICD-10-CM

## 2024-07-21 DIAGNOSIS — I2510 Atherosclerotic heart disease of native coronary artery without angina pectoris: Secondary | ICD-10-CM

## 2024-07-21 LAB — CBC AND DIFFERENTIAL
HCT: 40 (ref 36–46)
Hemoglobin: 13.1 (ref 12.0–16.0)
Neutrophils Absolute: 2.4
Platelets: 226 K/uL (ref 150–400)
WBC: 7

## 2024-07-21 LAB — BASIC METABOLIC PANEL WITH GFR
BUN: 6 (ref 4–21)
CO2: 21 (ref 13–22)
Chloride: 104 (ref 99–108)
Creatinine: 0.8 (ref 0.5–1.1)
Glucose: 80
Potassium: 4 meq/L (ref 3.5–5.1)
Sodium: 139 (ref 137–147)

## 2024-07-21 LAB — COMPREHENSIVE METABOLIC PANEL WITH GFR
Albumin: 4.2 (ref 3.5–5.0)
Calcium: 9.1 (ref 8.7–10.7)
Globulin: 2.4
eGFR: 91

## 2024-07-21 LAB — LIPID PANEL
Cholesterol: 170 (ref 0–200)
HDL: 47 (ref 35–70)
LDL Cholesterol: 32
LDl/HDL Ratio: 0.7
Triglycerides: 681 — AB (ref 40–160)

## 2024-07-21 LAB — HEPATIC FUNCTION PANEL
ALT: 21 U/L (ref 7–35)
AST: 33 (ref 13–35)
Alkaline Phosphatase: 109 (ref 25–125)
Bilirubin, Total: 0.3

## 2024-07-21 LAB — VITAMIN D 25 HYDROXY (VIT D DEFICIENCY, FRACTURES): Vit D, 25-Hydroxy: 29.9

## 2024-07-21 LAB — CBC: RBC: 3.8 — AB (ref 3.87–5.11)

## 2024-07-21 LAB — VITAMIN B12: Vitamin B-12: 382

## 2024-07-22 LAB — LAB REPORT - SCANNED: EGFR: 91

## 2024-08-01 DIAGNOSIS — E559 Vitamin D deficiency, unspecified: Secondary | ICD-10-CM

## 2024-08-03 LAB — CHG ASSAY OF FOLIC ACID SERUM: Folate: 2.5 — ABNORMAL LOW

## 2024-08-06 MED ORDER — VITAMIN D (ERGOCALCIFEROL) 1.25 MG (50000 UNIT) PO CAPS: 50000.0000 [IU] | ORAL_CAPSULE | ORAL | 1 refills | Status: AC

## 2024-08-06 NOTE — Addendum Note (Signed)
 Addended by: Roseann Kees K on: 08/06/2024 03:01 PM   Modules accepted: Orders

## 2024-09-18 ENCOUNTER — Other Ambulatory Visit: Payer: Self-pay | Admitting: Primary Care

## 2024-09-18 ENCOUNTER — Encounter: Payer: Self-pay | Admitting: Primary Care

## 2024-09-18 DIAGNOSIS — M792 Neuralgia and neuritis, unspecified: Secondary | ICD-10-CM

## 2024-09-20 NOTE — Progress Notes (Deleted)
  Cardiology Office Note:  .   Date:  09/20/2024  ID:  RHYEN MAZARIEGO, DOB 1961/08/17, MRN [id number] PCP: Gretta Comer POUR, NP  [facility] Providers Cardiologist:  Darryle ONEIDA Decent, MD { Click to update primary MD,subspecialty MD or APP then REFRESH:1}   History of Present Illness: .   No chief complaint on file.   Amber Roberts is a 63 y.o. female with history of orthostatic hypotension, HTN, HLD, tobacco abuse who presents for follow-up.     *** Problem List HTN HLD -T chol 170, HDL 47, LDL 32, TG 681 3. Orthostatic hypotension 4. Tobacco abuse     ROS: All other ROS reviewed and negative. Pertinent positives noted in the HPI.     Studies Reviewed: SABRA       TTE 11/25/2023  1. Left ventricular ejection fraction, by estimation, is 55 to 60%. Left  ventricular ejection fraction by 3D volume is 56 %. The left ventricle has  normal function. The left ventricle has no regional wall motion  abnormalities. Left ventricular diastolic   parameters are consistent with Grade I diastolic dysfunction (impaired  relaxation). The average left ventricular global longitudinal strain is  -15.0 %.   2. Right ventricular systolic function is normal. The right ventricular  size is normal. There is normal pulmonary artery systolic pressure.   3. The mitral valve is normal in structure. Trivial mitral valve  regurgitation. No evidence of mitral stenosis.   4. The aortic valve is tricuspid. Aortic valve regurgitation is not  visualized. No aortic stenosis is present.   5. The inferior vena cava is normal in size with greater than 50%  respiratory variability, suggesting right atrial pressure of 3 mmHg.  Physical Exam:   VS:  There were no vitals taken for this visit.   Wt Readings from Last 3 Encounters:  06/19/24 120 lb (54.4 kg)  05/18/24 120 lb 6.4 oz (54.6 kg)  01/20/24 122 lb (55.3 kg)    GEN: Well nourished, well developed in no acute distress NECK: No JVD; No  carotid bruits CARDIAC: ***RRR, no murmurs, rubs, gallops RESPIRATORY:  Clear to auscultation without rales, wheezing or rhonchi  ABDOMEN: Soft, non-tender, non-distended EXTREMITIES:  No edema; No deformity  ASSESSMENT AND PLAN: .   ***    {Are you ordering a CV Procedure (e.g. stress test, cath, DCCV, TEE, etc)?   Press F2        :[id number]}   Follow-up: No follow-ups on file.  Signed, Darryle ONEIDA. Decent, MD, [facility]  [facility]  [address] [place], [place] [address] [phone]  7:58 AM

## 2024-09-21 ENCOUNTER — Ambulatory Visit: Admitting: Cardiovascular Disease

## 2024-09-21 DIAGNOSIS — R002 Palpitations: Secondary | ICD-10-CM

## 2024-09-21 DIAGNOSIS — Z720 Tobacco use: Secondary | ICD-10-CM

## 2024-09-21 DIAGNOSIS — E785 Hyperlipidemia, unspecified: Secondary | ICD-10-CM

## 2024-09-21 DIAGNOSIS — I951 Orthostatic hypotension: Secondary | ICD-10-CM

## 2024-09-21 DIAGNOSIS — I10 Essential (primary) hypertension: Secondary | ICD-10-CM

## 2024-10-13 ENCOUNTER — Inpatient Hospital Stay (HOSPITAL_BASED_OUTPATIENT_CLINIC_OR_DEPARTMENT_OTHER): Admission: RE | Admit: 2024-10-13 | Admitting: Radiology

## 2024-10-14 ENCOUNTER — Inpatient Hospital Stay (HOSPITAL_BASED_OUTPATIENT_CLINIC_OR_DEPARTMENT_OTHER): Admission: RE | Admit: 2024-10-14 | Discharge: 2024-10-14 | Attending: Primary Care

## 2024-10-14 DIAGNOSIS — E2839 Other primary ovarian failure: Secondary | ICD-10-CM

## 2024-10-16 ENCOUNTER — Ambulatory Visit: Payer: Self-pay | Admitting: Primary Care

## 2024-10-16 DIAGNOSIS — M810 Age-related osteoporosis without current pathological fracture: Secondary | ICD-10-CM

## 2024-10-18 NOTE — Telephone Encounter (Signed)
 There has been 2 changes from last time benefits were ran 1 there is a generic and 2 she might be able to get at better price now.

## 2024-10-19 ENCOUNTER — Other Ambulatory Visit: Payer: Self-pay | Admitting: Cardiovascular Disease

## 2024-10-19 MED ORDER — TRAMADOL HCL 50 MG PO TABS: 50.0000 mg | ORAL_TABLET | Freq: Every evening | ORAL | 0 refills | Status: AC | PRN

## 2024-10-19 NOTE — Addendum Note (Signed)
 Addended by: Carmin Alvidrez K on: 10/19/2024 10:03 AM   Modules accepted: Orders

## 2024-10-24 ENCOUNTER — Other Ambulatory Visit: Payer: Self-pay

## 2024-10-24 DIAGNOSIS — M810 Age-related osteoporosis without current pathological fracture: Secondary | ICD-10-CM

## 2024-10-24 MED ORDER — DENOSUMAB 60 MG/ML ~~LOC~~ SOSY: 60.0000 mg | PREFILLED_SYRINGE | Freq: Once | SUBCUTANEOUS | Status: AC

## 2024-10-25 ENCOUNTER — Other Ambulatory Visit (HOSPITAL_COMMUNITY): Payer: Self-pay

## 2024-10-30 ENCOUNTER — Telehealth: Payer: Self-pay

## 2024-10-30 ENCOUNTER — Other Ambulatory Visit: Payer: Self-pay | Admitting: Primary Care

## 2024-10-30 DIAGNOSIS — M792 Neuralgia and neuritis, unspecified: Secondary | ICD-10-CM

## 2024-10-30 NOTE — Telephone Encounter (Signed)
 Prolia  VOB initiated via [email or web address]  Next Prolia  inj DUE: NEW START

## 2024-10-31 NOTE — Telephone Encounter (Signed)
 Can we check on her Prolia  or the competitor coverage? She is a great candidate.

## 2024-11-03 MED ORDER — DENOSUMAB 60 MG/ML ~~LOC~~ SOSY: 60.0000 mg | PREFILLED_SYRINGE | Freq: Once | SUBCUTANEOUS | Status: AC

## 2024-11-03 NOTE — Telephone Encounter (Signed)
 Referral has been placed for Prolia  to start benefit verification process.

## 2024-11-03 NOTE — Addendum Note (Signed)
 Addended by: ALBINO SHAVER C on: 11/03/2024 07:59 AM   Modules accepted: Orders

## 2024-11-08 ENCOUNTER — Other Ambulatory Visit (HOSPITAL_COMMUNITY): Payer: Self-pay

## 2024-11-08 NOTE — Telephone Encounter (Signed)
 Pt ready for scheduling for PROLIA  on or after : 11/08/24  Option# 1: Buy/Bill (Office supplied medication)  Out-of-pocket cost due at time of clinic visit: $470 + DEDUCTIBLE  Number of injection/visits approved: 2  Primary: [employer] Prolia  co-insurance: 25% Admin fee co-insurance: 25%  Secondary: --- Prolia  co-insurance:  Admin fee co-insurance:   Medical Benefit Details: Date Benefits were checked: 11/07/24 Deductible: $0 Met of $1700 Required/ Coinsurance: 25%/ Admin Fee: 25%  Prior Auth: APPROVED PA# [id number] Expiration Date: 11/08/24-11/08/25  # of doses approved: 2 ----------------------------------------------------------------------- Option# 2- Med Obtained from pharmacy:  Pharmacy benefit: Copay $MUST FILL AT SPECIALTY PHARMACY (Paid to pharmacy) Admin Fee: 25% (Pay at clinic)  Prior Auth: N/A PA# Expiration Date:   # of doses approved:   If patient wants fill through the pharmacy benefit please send prescription to: [facility], and include estimated need by date in rx notes. Pharmacy will ship medication directly to the office.  Patient IS eligible for Prolia  Copay Card. Copay Card can make patient's cost as little as $25. Link to apply: [email or web address]://[email or web address]  ** This summary of benefits is an estimation of the patient's out-of-pocket cost. Exact cost may very based on individual plan coverage.

## 2024-11-08 NOTE — Telephone Encounter (Signed)
 SABRA

## 2024-11-10 ENCOUNTER — Encounter: Payer: Self-pay | Admitting: *Deleted

## 2024-11-10 ENCOUNTER — Other Ambulatory Visit: Payer: Self-pay | Admitting: *Deleted

## 2024-11-10 MED ORDER — DENOSUMAB 60 MG/ML ~~LOC~~ SOSY: 60.0000 mg | PREFILLED_SYRINGE | Freq: Once | SUBCUTANEOUS | 0 refills | Status: DC

## 2024-11-15 MED ORDER — DENOSUMAB 60 MG/ML ~~LOC~~ SOSY
60.0000 mg | PREFILLED_SYRINGE | Freq: Once | SUBCUTANEOUS | 0 refills | Status: DC
  Filled 2024-11-16: qty 1, 1d supply, fill #0

## 2024-11-16 ENCOUNTER — Other Ambulatory Visit: Payer: Self-pay

## 2024-11-16 ENCOUNTER — Other Ambulatory Visit (HOSPITAL_COMMUNITY): Payer: Self-pay

## 2024-11-17 ENCOUNTER — Other Ambulatory Visit: Payer: Self-pay

## 2024-12-08 ENCOUNTER — Other Ambulatory Visit (HOSPITAL_COMMUNITY): Payer: Self-pay
